# Patient Record
Sex: Male | Born: 1945 | Race: White | Hispanic: No | Marital: Married | Smoking: Never smoker
Health system: Southern US, Community
[De-identification: ages and names within clinical notes are randomized; demographics above are authoritative.]

## PROBLEM LIST (undated history)

## (undated) DIAGNOSIS — G9050 Complex regional pain syndrome I, unspecified: Secondary | ICD-10-CM

## (undated) DIAGNOSIS — Z87898 Personal history of other specified conditions: Secondary | ICD-10-CM

## (undated) DIAGNOSIS — G2581 Restless legs syndrome: Secondary | ICD-10-CM

## (undated) DIAGNOSIS — C801 Malignant (primary) neoplasm, unspecified: Secondary | ICD-10-CM

## (undated) DIAGNOSIS — H9190 Unspecified hearing loss, unspecified ear: Secondary | ICD-10-CM

## (undated) DIAGNOSIS — I499 Cardiac arrhythmia, unspecified: Secondary | ICD-10-CM

## (undated) DIAGNOSIS — Z8679 Personal history of other diseases of the circulatory system: Secondary | ICD-10-CM

## (undated) DIAGNOSIS — I10 Essential (primary) hypertension: Secondary | ICD-10-CM

## (undated) DIAGNOSIS — M660 Rupture of popliteal cyst: Secondary | ICD-10-CM

## (undated) DIAGNOSIS — R32 Unspecified urinary incontinence: Secondary | ICD-10-CM

## (undated) DIAGNOSIS — T84093A Other mechanical complication of internal left knee prosthesis, initial encounter: Secondary | ICD-10-CM

## (undated) DIAGNOSIS — M1990 Unspecified osteoarthritis, unspecified site: Secondary | ICD-10-CM

## (undated) DIAGNOSIS — K219 Gastro-esophageal reflux disease without esophagitis: Secondary | ICD-10-CM

## (undated) DIAGNOSIS — N189 Chronic kidney disease, unspecified: Secondary | ICD-10-CM

## (undated) HISTORY — DX: Rupture of popliteal cyst: M66.0

## (undated) HISTORY — PX: FOOT SURGERY: SHX648

## (undated) HISTORY — PX: JOINT REPLACEMENT: SHX530

## (undated) HISTORY — PX: PROSTATE BIOPSY: SHX241

## (undated) HISTORY — PX: BLADDER SURGERY: SHX569

## (undated) HISTORY — PX: OTHER SURGICAL HISTORY: SHX169

## (undated) HISTORY — PX: TRANSURETHRAL RESECTION OF PROSTATE: SHX73

---

## 1969-04-14 DIAGNOSIS — Z87898 Personal history of other specified conditions: Secondary | ICD-10-CM

## 1969-04-14 HISTORY — DX: Personal history of other specified conditions: Z87.898

## 1977-08-14 HISTORY — PX: CHOLECYSTECTOMY: SHX55

## 1989-04-14 HISTORY — PX: NASAL SINUS SURGERY: SHX719

## 2003-12-04 ENCOUNTER — Other Ambulatory Visit: Payer: Self-pay

## 2004-07-22 ENCOUNTER — Other Ambulatory Visit: Payer: Self-pay

## 2004-07-29 ENCOUNTER — Ambulatory Visit: Payer: Self-pay | Admitting: General Practice

## 2005-06-02 ENCOUNTER — Emergency Department: Payer: Self-pay | Admitting: Emergency Medicine

## 2005-06-05 ENCOUNTER — Encounter: Payer: Self-pay | Admitting: Unknown Physician Specialty

## 2005-06-07 ENCOUNTER — Emergency Department: Payer: Self-pay | Admitting: Unknown Physician Specialty

## 2005-06-14 ENCOUNTER — Encounter: Payer: Self-pay | Admitting: Unknown Physician Specialty

## 2005-06-20 ENCOUNTER — Emergency Department: Payer: Self-pay | Admitting: Emergency Medicine

## 2005-07-03 ENCOUNTER — Other Ambulatory Visit: Payer: Self-pay

## 2005-07-13 ENCOUNTER — Inpatient Hospital Stay: Payer: Self-pay | Admitting: General Practice

## 2005-07-14 ENCOUNTER — Encounter: Payer: Self-pay | Admitting: Unknown Physician Specialty

## 2005-08-03 ENCOUNTER — Ambulatory Visit: Payer: Self-pay | Admitting: Internal Medicine

## 2005-08-21 ENCOUNTER — Ambulatory Visit: Payer: Self-pay | Admitting: Surgery

## 2005-09-05 ENCOUNTER — Ambulatory Visit: Payer: Self-pay | Admitting: General Practice

## 2005-09-15 ENCOUNTER — Ambulatory Visit: Payer: Self-pay | Admitting: Surgery

## 2005-10-09 ENCOUNTER — Ambulatory Visit: Payer: Self-pay | Admitting: Unknown Physician Specialty

## 2005-10-19 ENCOUNTER — Ambulatory Visit: Payer: Self-pay | Admitting: Gastroenterology

## 2005-10-26 ENCOUNTER — Ambulatory Visit: Payer: Self-pay | Admitting: Gastroenterology

## 2005-12-12 ENCOUNTER — Ambulatory Visit: Payer: Self-pay | Admitting: Gastroenterology

## 2006-05-16 ENCOUNTER — Other Ambulatory Visit: Payer: Self-pay

## 2006-05-22 ENCOUNTER — Ambulatory Visit: Payer: Self-pay | Admitting: Urology

## 2007-01-31 ENCOUNTER — Ambulatory Visit: Payer: Self-pay

## 2007-09-06 ENCOUNTER — Ambulatory Visit: Payer: Self-pay | Admitting: General Practice

## 2007-11-04 ENCOUNTER — Encounter: Payer: Self-pay | Admitting: General Practice

## 2007-11-13 ENCOUNTER — Encounter: Payer: Self-pay | Admitting: General Practice

## 2007-11-27 ENCOUNTER — Ambulatory Visit: Payer: Self-pay | Admitting: General Practice

## 2007-12-17 ENCOUNTER — Other Ambulatory Visit: Payer: Self-pay

## 2007-12-17 ENCOUNTER — Ambulatory Visit: Payer: Self-pay | Admitting: General Practice

## 2007-12-30 ENCOUNTER — Inpatient Hospital Stay: Payer: Self-pay | Admitting: General Practice

## 2008-04-14 ENCOUNTER — Ambulatory Visit: Payer: Self-pay | Admitting: Urology

## 2008-05-09 ENCOUNTER — Emergency Department: Payer: Self-pay | Admitting: Emergency Medicine

## 2008-07-27 ENCOUNTER — Ambulatory Visit: Payer: Self-pay

## 2008-08-04 ENCOUNTER — Inpatient Hospital Stay: Payer: Self-pay | Admitting: General Practice

## 2008-09-02 ENCOUNTER — Ambulatory Visit: Payer: Self-pay | Admitting: Gastroenterology

## 2008-09-25 ENCOUNTER — Ambulatory Visit: Payer: Self-pay | Admitting: Gastroenterology

## 2008-10-02 ENCOUNTER — Ambulatory Visit: Payer: Self-pay | Admitting: Gastroenterology

## 2009-03-19 ENCOUNTER — Ambulatory Visit: Payer: Self-pay | Admitting: General Practice

## 2009-03-29 ENCOUNTER — Ambulatory Visit: Payer: Self-pay | Admitting: General Practice

## 2009-06-09 ENCOUNTER — Emergency Department: Payer: Self-pay | Admitting: Emergency Medicine

## 2009-09-28 ENCOUNTER — Ambulatory Visit: Payer: Self-pay | Admitting: Urology

## 2010-03-16 ENCOUNTER — Ambulatory Visit: Payer: Self-pay | Admitting: Urology

## 2010-03-22 ENCOUNTER — Ambulatory Visit: Payer: Self-pay | Admitting: Urology

## 2010-03-24 LAB — PATHOLOGY REPORT

## 2010-04-06 ENCOUNTER — Ambulatory Visit: Payer: Self-pay | Admitting: Surgery

## 2010-04-11 ENCOUNTER — Ambulatory Visit: Payer: Self-pay | Admitting: Surgery

## 2010-04-11 ENCOUNTER — Ambulatory Visit: Payer: Self-pay | Admitting: Unknown Physician Specialty

## 2010-10-25 ENCOUNTER — Ambulatory Visit: Payer: Self-pay | Admitting: Urology

## 2010-10-26 LAB — PATHOLOGY REPORT

## 2010-11-29 ENCOUNTER — Ambulatory Visit: Payer: Self-pay | Admitting: Urology

## 2011-05-17 ENCOUNTER — Ambulatory Visit: Payer: Self-pay | Admitting: Urology

## 2011-05-17 DIAGNOSIS — I10 Essential (primary) hypertension: Secondary | ICD-10-CM

## 2011-05-23 ENCOUNTER — Ambulatory Visit: Payer: Self-pay | Admitting: Urology

## 2011-05-26 LAB — PATHOLOGY REPORT

## 2011-11-16 ENCOUNTER — Ambulatory Visit: Payer: Self-pay | Admitting: Orthopedic Surgery

## 2011-11-30 ENCOUNTER — Ambulatory Visit: Payer: Self-pay | Admitting: Orthopedic Surgery

## 2011-12-12 ENCOUNTER — Ambulatory Visit: Payer: Self-pay | Admitting: Urology

## 2011-12-12 LAB — BASIC METABOLIC PANEL
BUN: 21 mg/dL — ABNORMAL HIGH (ref 7–18)
Calcium, Total: 9 mg/dL (ref 8.5–10.1)
Chloride: 104 mmol/L (ref 98–107)
Creatinine: 0.79 mg/dL (ref 0.60–1.30)
EGFR (African American): >60
EGFR (Non-African Amer.): >60
Glucose: 98 mg/dL (ref 65–99)
Potassium: 3.8 mmol/L (ref 3.5–5.1)
Sodium: 139 mmol/L (ref 136–145)

## 2011-12-13 LAB — PATHOLOGY REPORT

## 2012-03-04 ENCOUNTER — Encounter (HOSPITAL_COMMUNITY): Payer: Self-pay | Admitting: Pharmacy Technician

## 2012-03-13 ENCOUNTER — Other Ambulatory Visit (HOSPITAL_COMMUNITY): Payer: Medicare Other

## 2012-03-19 ENCOUNTER — Ambulatory Visit (HOSPITAL_COMMUNITY)
Admission: RE | Admit: 2012-03-19 | Payer: BC Managed Care – PPO | Source: Ambulatory Visit | Admitting: Orthopedic Surgery

## 2012-03-19 ENCOUNTER — Encounter (HOSPITAL_COMMUNITY): Admission: RE | Payer: Self-pay | Source: Ambulatory Visit

## 2012-03-19 SURGERY — TOTAL KNEE REVISION
Anesthesia: General | Laterality: Left

## 2012-04-05 ENCOUNTER — Inpatient Hospital Stay: Payer: Self-pay | Admitting: Internal Medicine

## 2012-04-05 LAB — URINALYSIS, COMPLETE
Bacteria: NONE SEEN
Ketone: NEGATIVE
Nitrite: NEGATIVE
Protein: NEGATIVE
RBC,UR: 4 /HPF (ref 0–5)
Squamous Epithelial: 1
WBC UR: 102 /HPF (ref 0–5)

## 2012-04-05 LAB — CBC
HCT: 46.6 % (ref 40.0–52.0)
HGB: 15.2 g/dL (ref 13.0–18.0)
MCH: 30 pg (ref 26.0–34.0)
MCHC: 32.7 g/dL (ref 32.0–36.0)
MCV: 92 fL (ref 80–100)
RBC: 5.08 10*6/uL (ref 4.40–5.90)

## 2012-04-05 LAB — COMPREHENSIVE METABOLIC PANEL
Anion Gap: 5 — ABNORMAL LOW (ref 7–16)
BUN: 16 mg/dL (ref 7–18)
Calcium, Total: 9.7 mg/dL (ref 8.5–10.1)
Chloride: 100 mmol/L (ref 98–107)
Co2: 30 mmol/L (ref 21–32)
EGFR (African American): >60
EGFR (Non-African Amer.): >60
Potassium: 4.5 mmol/L (ref 3.5–5.1)
SGOT(AST): 28 U/L (ref 15–37)
Total Protein: 8.3 g/dL — ABNORMAL HIGH (ref 6.4–8.2)

## 2012-04-06 LAB — CBC WITH DIFFERENTIAL/PLATELET
Basophil #: 0.1 10*3/uL (ref 0.0–0.1)
Eosinophil #: 0 10*3/uL (ref 0.0–0.7)
HCT: 41.7 % (ref 40.0–52.0)
HGB: 13.7 g/dL (ref 13.0–18.0)
Lymphocyte %: 5.3 %
MCH: 30.2 pg (ref 26.0–34.0)
MCHC: 32.8 g/dL (ref 32.0–36.0)
MCV: 92 fL (ref 80–100)
Monocyte #: 1.3 x10 3/mm — ABNORMAL HIGH (ref 0.2–1.0)
Monocyte %: 5.6 %
Neutrophil #: 20 10*3/uL — ABNORMAL HIGH (ref 1.4–6.5)

## 2012-04-07 LAB — CBC WITH DIFFERENTIAL/PLATELET
Basophil #: 0 10*3/uL (ref 0.0–0.1)
Eosinophil %: 1.3 %
Lymphocyte #: 1.3 10*3/uL (ref 1.0–3.6)
MCHC: 33.2 g/dL (ref 32.0–36.0)
MCV: 92 fL (ref 80–100)
Monocyte %: 5.2 %
Neutrophil %: 83.8 %
Platelet: 197 10*3/uL (ref 150–440)
RBC: 4.36 10*6/uL — ABNORMAL LOW (ref 4.40–5.90)
RDW: 14.1 % (ref 11.5–14.5)

## 2012-04-11 ENCOUNTER — Encounter (HOSPITAL_COMMUNITY): Payer: Self-pay | Admitting: Pharmacy Technician

## 2012-04-11 LAB — CULTURE, BLOOD (SINGLE)

## 2012-04-17 ENCOUNTER — Other Ambulatory Visit: Payer: Self-pay | Admitting: Orthopedic Surgery

## 2012-04-17 ENCOUNTER — Inpatient Hospital Stay (HOSPITAL_COMMUNITY): Admission: RE | Admit: 2012-04-17 | Payer: BC Managed Care – PPO | Source: Ambulatory Visit

## 2012-04-18 ENCOUNTER — Ambulatory Visit: Payer: Medicare Other | Admitting: Internal Medicine

## 2012-04-19 ENCOUNTER — Encounter (HOSPITAL_COMMUNITY): Payer: Self-pay

## 2012-04-19 ENCOUNTER — Encounter (HOSPITAL_COMMUNITY)
Admission: RE | Admit: 2012-04-19 | Discharge: 2012-04-19 | Disposition: A | Discharge status: Home / Self Care | Payer: BC Managed Care – PPO | Source: Ambulatory Visit | Attending: Orthopedic Surgery | Admitting: Orthopedic Surgery

## 2012-04-19 ENCOUNTER — Ambulatory Visit (HOSPITAL_COMMUNITY)
Admission: RE | Admit: 2012-04-19 | Discharge: 2012-04-19 | Disposition: A | Discharge status: Home / Self Care | Payer: BC Managed Care – PPO | Source: Ambulatory Visit | Attending: Orthopedic Surgery | Admitting: Orthopedic Surgery

## 2012-04-19 DIAGNOSIS — Z01812 Encounter for preprocedural laboratory examination: Secondary | ICD-10-CM | POA: Insufficient documentation

## 2012-04-19 DIAGNOSIS — T84099A Other mechanical complication of unspecified internal joint prosthesis, initial encounter: Secondary | ICD-10-CM | POA: Insufficient documentation

## 2012-04-19 DIAGNOSIS — Z96659 Presence of unspecified artificial knee joint: Secondary | ICD-10-CM | POA: Insufficient documentation

## 2012-04-19 DIAGNOSIS — Z01810 Encounter for preprocedural cardiovascular examination: Secondary | ICD-10-CM | POA: Insufficient documentation

## 2012-04-19 DIAGNOSIS — Y831 Surgical operation with implant of artificial internal device as the cause of abnormal reaction of the patient, or of later complication, without mention of misadventure at the time of the procedure: Secondary | ICD-10-CM | POA: Insufficient documentation

## 2012-04-19 DIAGNOSIS — I10 Essential (primary) hypertension: Secondary | ICD-10-CM | POA: Insufficient documentation

## 2012-04-19 HISTORY — DX: Personal history of other diseases of the circulatory system: Z86.79

## 2012-04-19 HISTORY — DX: Cardiac arrhythmia, unspecified: I49.9

## 2012-04-19 HISTORY — DX: Complex regional pain syndrome I, unspecified: G90.50

## 2012-04-19 HISTORY — DX: Essential (primary) hypertension: I10

## 2012-04-19 HISTORY — DX: Unspecified urinary incontinence: R32

## 2012-04-19 HISTORY — DX: Unspecified hearing loss, unspecified ear: H91.90

## 2012-04-19 HISTORY — DX: Restless legs syndrome: G25.81

## 2012-04-19 HISTORY — DX: Unspecified osteoarthritis, unspecified site: M19.90

## 2012-04-19 HISTORY — DX: Malignant (primary) neoplasm, unspecified: C80.1

## 2012-04-19 HISTORY — DX: Personal history of other specified conditions: Z87.898

## 2012-04-19 HISTORY — DX: Gastro-esophageal reflux disease without esophagitis: K21.9

## 2012-04-19 HISTORY — DX: Chronic kidney disease, unspecified: N18.9

## 2012-04-19 LAB — SURGICAL PCR SCREEN
MRSA, PCR: NEGATIVE
Staphylococcus aureus: NEGATIVE

## 2012-04-19 LAB — URINE MICROSCOPIC-ADD ON

## 2012-04-19 LAB — URINALYSIS, ROUTINE W REFLEX MICROSCOPIC
Bilirubin Urine: NEGATIVE
Glucose, UA: NEGATIVE mg/dL
Hgb urine dipstick: NEGATIVE
Specific Gravity, Urine: 1.022 (ref 1.005–1.030)
Urobilinogen, UA: 0.2 mg/dL (ref 0.0–1.0)
pH: 7.5 (ref 5.0–8.0)

## 2012-04-19 LAB — CBC
HCT: 45.9 % (ref 39.0–52.0)
Hemoglobin: 15.1 g/dL (ref 13.0–17.0)
MCH: 29.8 pg (ref 26.0–34.0)
MCHC: 32.9 g/dL (ref 30.0–36.0)
MCV: 90.5 fL (ref 78.0–100.0)
RBC: 5.07 MIL/uL (ref 4.22–5.81)

## 2012-04-19 LAB — BASIC METABOLIC PANEL
BUN: 20 mg/dL (ref 6–23)
CO2: 31 mEq/L (ref 19–32)
Glucose, Bld: 101 mg/dL — ABNORMAL HIGH (ref 70–99)
Potassium: 4.6 mEq/L (ref 3.5–5.1)
Sodium: 135 mEq/L (ref 135–145)

## 2012-04-19 LAB — APTT: aPTT: 32 seconds (ref 24–37)

## 2012-04-19 NOTE — Progress Notes (Signed)
UA faxed to office - spoke with Leotis Shames and Jewel Baize at office to confirm received .

## 2012-04-19 NOTE — Patient Instructions (Addendum)
20 Garrett Ferguson  04/19/2012   Your procedure is scheduled on: 910/13 AT 7:30 AM  Report to SHORT STAY DEPT  At  5:15 AM.  Call this number if you have problems the morning of surgery: [phone]   Remember:   Do not eat food or drink liquids AFTER MIDNIGHT    Take these medicines the morning of surgery with A SIP OF WATER:ALPRAZOLAM / DILTIAZEM / HYOPHEN / MIRAPEX / TRAMADOL   Do not wear jewelry, make-up or nail polish.  Do not wear lotions, powders, or perfumes.   Do not shave legs or underarms 48 hrs. before surgery (men may shave face)  Do not bring valuables to the hospital.  Contacts, dentures or bridgework may not be worn into surgery.  Leave suitcase in the car. After surgery it may be brought to your room.  For patients admitted to the hospital, checkout time is 11:00 AM the day of discharge.   Patients discharged the day of surgery will not be allowed to drive home. If going home same day of surgery, must have someone stay with you first 24 hrs at home and arrange for some one to drive you home from hospital.    Special Instructions:   Please read over the following fact sheets that you were given: MRSA  Information / Incentive Spirometer               SHOWER WITH BETASEPT THE NIGHT BEFORE SURGERY AND THE MORNING OF SURGERY          X______________________________________________________________________

## 2012-04-23 ENCOUNTER — Encounter (HOSPITAL_COMMUNITY)
Admission: RE | Disposition: A | Discharge status: Skilled Nursing Facility | Payer: Self-pay | Source: Ambulatory Visit | Attending: Orthopedic Surgery

## 2012-04-23 ENCOUNTER — Inpatient Hospital Stay (HOSPITAL_COMMUNITY)
Admission: RE | Admit: 2012-04-23 | Discharge: 2012-04-26 | DRG: 468 | Disposition: A | Discharge status: Skilled Nursing Facility | Payer: BC Managed Care – PPO | Source: Ambulatory Visit | Attending: Orthopedic Surgery | Admitting: Orthopedic Surgery

## 2012-04-23 ENCOUNTER — Ambulatory Visit (HOSPITAL_COMMUNITY): Payer: BC Managed Care – PPO

## 2012-04-23 ENCOUNTER — Encounter (HOSPITAL_COMMUNITY): Payer: Self-pay | Admitting: Anesthesiology

## 2012-04-23 ENCOUNTER — Encounter (HOSPITAL_COMMUNITY): Payer: Self-pay | Admitting: Orthopedic Surgery

## 2012-04-23 ENCOUNTER — Ambulatory Visit (HOSPITAL_COMMUNITY): Payer: BC Managed Care – PPO | Admitting: Anesthesiology

## 2012-04-23 ENCOUNTER — Encounter (HOSPITAL_COMMUNITY): Payer: Self-pay | Admitting: *Deleted

## 2012-04-23 DIAGNOSIS — T84039A Mechanical loosening of unspecified internal prosthetic joint, initial encounter: Principal | ICD-10-CM | POA: Diagnosis present

## 2012-04-23 DIAGNOSIS — Z96659 Presence of unspecified artificial knee joint: Secondary | ICD-10-CM

## 2012-04-23 DIAGNOSIS — I10 Essential (primary) hypertension: Secondary | ICD-10-CM | POA: Diagnosis present

## 2012-04-23 DIAGNOSIS — Y831 Surgical operation with implant of artificial internal device as the cause of abnormal reaction of the patient, or of later complication, without mention of misadventure at the time of the procedure: Secondary | ICD-10-CM | POA: Diagnosis present

## 2012-04-23 DIAGNOSIS — T84099A Other mechanical complication of unspecified internal joint prosthesis, initial encounter: Secondary | ICD-10-CM | POA: Diagnosis present

## 2012-04-23 DIAGNOSIS — Z8551 Personal history of malignant neoplasm of bladder: Secondary | ICD-10-CM

## 2012-04-23 DIAGNOSIS — T84093A Other mechanical complication of internal left knee prosthesis, initial encounter: Secondary | ICD-10-CM

## 2012-04-23 HISTORY — DX: Other mechanical complication of internal left knee prosthesis, initial encounter: T84.093A

## 2012-04-23 HISTORY — PX: TOTAL KNEE REVISION: SHX996

## 2012-04-23 LAB — GRAM STAIN

## 2012-04-23 LAB — SYNOVIAL CELL COUNT + DIFF, W/ CRYSTALS
Lymphocytes-Synovial Fld: 33 % — ABNORMAL HIGH (ref 0–20)
Monocyte-Macrophage-Synovial Fluid: 34 % — ABNORMAL LOW (ref 50–90)
Neutrophil, Synovial: 32 % — ABNORMAL HIGH (ref 0–25)

## 2012-04-23 LAB — TYPE AND SCREEN
ABO/RH(D): B POS
Antibody Screen: NEGATIVE

## 2012-04-23 SURGERY — TOTAL KNEE REVISION
Anesthesia: General | Site: Knee | Laterality: Left | Wound class: Clean

## 2012-04-23 MED ORDER — ADULT MULTIVITAMIN W/MINERALS CH
1.0000 | ORAL_TABLET | Freq: Every day | ORAL | Status: DC
  Administered 2012-04-24 – 2012-04-26 (×3): 1 via ORAL
  Filled 2012-04-23 (×4): qty 1

## 2012-04-23 MED ORDER — URELLE 81 MG PO TABS
1.0000 | ORAL_TABLET | Freq: Four times a day (QID) | ORAL | Status: DC
  Administered 2012-04-23 – 2012-04-26 (×10): 81 mg via ORAL
  Filled 2012-04-23 (×15): qty 1

## 2012-04-23 MED ORDER — ONDANSETRON HCL 4 MG/2ML IJ SOLN: 4.0000 mg | Freq: Four times a day (QID) | INTRAMUSCULAR | Status: DC | PRN

## 2012-04-23 MED ORDER — PROMETHAZINE HCL 25 MG PO TABS: 25.0000 mg | ORAL_TABLET | Freq: Four times a day (QID) | ORAL | Status: DC | PRN

## 2012-04-23 MED ORDER — LIDOCAINE HCL (CARDIAC) 20 MG/ML IV SOLN
INTRAVENOUS | Status: DC | PRN
  Administered 2012-04-23: 80 mg via INTRAVENOUS

## 2012-04-23 MED ORDER — PRAMIPEXOLE DIHYDROCHLORIDE 1.5 MG PO TABS
1.5000 mg | ORAL_TABLET | Freq: Every day | ORAL | Status: DC
  Administered 2012-04-23 – 2012-04-24 (×2): 1.5 mg via ORAL
  Filled 2012-04-23 (×4): qty 1

## 2012-04-23 MED ORDER — HYDROMORPHONE HCL PF 1 MG/ML IJ SOLN
0.2500 mg | INTRAMUSCULAR | Status: DC | PRN
  Administered 2012-04-23 (×2): 0.5 mg via INTRAVENOUS

## 2012-04-23 MED ORDER — PROMETHAZINE HCL 25 MG/ML IJ SOLN: 6.2500 mg | INTRAMUSCULAR | Status: DC | PRN

## 2012-04-23 MED ORDER — IMIPRAMINE HCL 25 MG PO TABS
25.0000 mg | ORAL_TABLET | Freq: Every day | ORAL | Status: DC
  Administered 2012-04-23 – 2012-04-24 (×2): 25 mg via ORAL
  Filled 2012-04-23 (×4): qty 1

## 2012-04-23 MED ORDER — METOCLOPRAMIDE HCL 10 MG PO TABS: 5.0000 mg | ORAL_TABLET | Freq: Three times a day (TID) | ORAL | Status: DC | PRN

## 2012-04-23 MED ORDER — NITROFURANTOIN MACROCRYSTAL 100 MG PO CAPS
100.0000 mg | ORAL_CAPSULE | Freq: Every day | ORAL | Status: DC
  Administered 2012-04-23 – 2012-04-24 (×2): 100 mg via ORAL
  Filled 2012-04-23 (×4): qty 1

## 2012-04-23 MED ORDER — WARFARIN SODIUM 7.5 MG PO TABS
7.5000 mg | ORAL_TABLET | Freq: Once | ORAL | Status: AC
  Administered 2012-04-23: 7.5 mg via ORAL
  Filled 2012-04-23: qty 1

## 2012-04-23 MED ORDER — WARFARIN VIDEO
Freq: Once | Status: AC
  Administered 2012-04-24: 12:00:00

## 2012-04-23 MED ORDER — WARFARIN SODIUM 5 MG PO TABS: 5.0000 mg | ORAL_TABLET | Freq: Every day | ORAL | Status: DC

## 2012-04-23 MED ORDER — KETOROLAC TROMETHAMINE 15 MG/ML IJ SOLN
7.5000 mg | Freq: Four times a day (QID) | INTRAMUSCULAR | Status: AC
  Administered 2012-04-23 – 2012-04-24 (×3): 7.5 mg via INTRAVENOUS
  Filled 2012-04-23 (×3): qty 1

## 2012-04-23 MED ORDER — SODIUM CHLORIDE 0.9 % IR SOLN
Status: DC | PRN
  Administered 2012-04-23: 1000 mL

## 2012-04-23 MED ORDER — ACETAMINOPHEN 325 MG PO TABS: 650.0000 mg | ORAL_TABLET | Freq: Four times a day (QID) | ORAL | Status: DC | PRN

## 2012-04-23 MED ORDER — ACETAMINOPHEN 10 MG/ML IV SOLN
1000.0000 mg | Freq: Four times a day (QID) | INTRAVENOUS | Status: AC
  Administered 2012-04-23 – 2012-04-24 (×4): 1000 mg via INTRAVENOUS
  Filled 2012-04-23 (×5): qty 100

## 2012-04-23 MED ORDER — DILTIAZEM HCL ER BEADS 240 MG PO CP24: 360.0000 mg | ORAL_CAPSULE | Freq: Every day | ORAL | Status: DC

## 2012-04-23 MED ORDER — DIPHENHYDRAMINE HCL 12.5 MG/5ML PO ELIX: 12.5000 mg | ORAL_SOLUTION | ORAL | Status: DC | PRN

## 2012-04-23 MED ORDER — ALUM & MAG HYDROXIDE-SIMETH 200-200-20 MG/5ML PO SUSP: 30.0000 mL | ORAL | Status: DC | PRN

## 2012-04-23 MED ORDER — PROPOFOL 10 MG/ML IV BOLUS
INTRAVENOUS | Status: DC | PRN
  Administered 2012-04-23: 170 mg via INTRAVENOUS
  Administered 2012-04-23: 30 mg via INTRAVENOUS

## 2012-04-23 MED ORDER — PHENOL 1.4 % MT LIQD: 1.0000 | OROMUCOSAL | Status: DC | PRN

## 2012-04-23 MED ORDER — OXYCODONE-ACETAMINOPHEN 10-325 MG PO TABS: 1.0000 | ORAL_TABLET | Freq: Four times a day (QID) | ORAL | Status: AC | PRN

## 2012-04-23 MED ORDER — METHOCARBAMOL 500 MG PO TABS
500.0000 mg | ORAL_TABLET | Freq: Four times a day (QID) | ORAL | Status: DC | PRN
  Administered 2012-04-24 – 2012-04-26 (×6): 500 mg via ORAL
  Filled 2012-04-23 (×6): qty 1

## 2012-04-23 MED ORDER — HYDROMORPHONE HCL PF 1 MG/ML IJ SOLN
INTRAMUSCULAR | Status: AC
  Administered 2012-04-23: 16:00:00
  Filled 2012-04-23: qty 1

## 2012-04-23 MED ORDER — LISINOPRIL 40 MG PO TABS
40.0000 mg | ORAL_TABLET | Freq: Every day | ORAL | Status: DC
  Administered 2012-04-24 – 2012-04-26 (×3): 40 mg via ORAL
  Filled 2012-04-23 (×3): qty 1

## 2012-04-23 MED ORDER — ZOLPIDEM TARTRATE 10 MG PO TABS: 10.0000 mg | ORAL_TABLET | Freq: Every evening | ORAL | Status: DC | PRN

## 2012-04-23 MED ORDER — ACETAMINOPHEN 10 MG/ML IV SOLN
INTRAVENOUS | Status: DC | PRN
  Administered 2012-04-23: 1000 mg via INTRAVENOUS

## 2012-04-23 MED ORDER — METOCLOPRAMIDE HCL 5 MG/ML IJ SOLN: 5.0000 mg | Freq: Three times a day (TID) | INTRAMUSCULAR | Status: DC | PRN

## 2012-04-23 MED ORDER — VANCOMYCIN HCL IN DEXTROSE 1-5 GM/200ML-% IV SOLN
1000.0000 mg | Freq: Two times a day (BID) | INTRAVENOUS | Status: AC
  Administered 2012-04-23: 1000 mg via INTRAVENOUS
  Filled 2012-04-23: qty 200

## 2012-04-23 MED ORDER — LACTATED RINGERS IV SOLN
INTRAVENOUS | Status: DC
  Administered 2012-04-23: 14:00:00 via INTRAVENOUS

## 2012-04-23 MED ORDER — QUINAPRIL HCL 10 MG PO TABS: 40.0000 mg | ORAL_TABLET | Freq: Every morning | ORAL | Status: DC

## 2012-04-23 MED ORDER — POTASSIUM CHLORIDE IN NACL 20-0.45 MEQ/L-% IV SOLN
INTRAVENOUS | Status: DC
  Administered 2012-04-23 – 2012-04-24 (×3): via INTRAVENOUS
  Filled 2012-04-23 (×6): qty 1000

## 2012-04-23 MED ORDER — MAGNESIUM HYDROXIDE 400 MG/5ML PO SUSP: 30.0000 mL | Freq: Every day | ORAL | Status: DC | PRN

## 2012-04-23 MED ORDER — PRAMIPEXOLE DIHYDROCHLORIDE 1 MG PO TABS: 1.0000 mg | ORAL_TABLET | Freq: Two times a day (BID) | ORAL | Status: DC

## 2012-04-23 MED ORDER — SUCCINYLCHOLINE CHLORIDE 20 MG/ML IJ SOLN
INTRAMUSCULAR | Status: DC | PRN
  Administered 2012-04-23: 80 mg via INTRAVENOUS

## 2012-04-23 MED ORDER — OXYCODONE HCL 5 MG PO TABS
5.0000 mg | ORAL_TABLET | ORAL | Status: DC | PRN
  Administered 2012-04-23 (×2): 5 mg via ORAL
  Administered 2012-04-24 – 2012-04-25 (×5): 10 mg via ORAL
  Administered 2012-04-25: 5 mg via ORAL
  Administered 2012-04-25 – 2012-04-26 (×4): 10 mg via ORAL
  Filled 2012-04-23: qty 2
  Filled 2012-04-23 (×2): qty 1
  Filled 2012-04-23 (×5): qty 2
  Filled 2012-04-23: qty 1
  Filled 2012-04-23 (×2): qty 2
  Filled 2012-04-23: qty 1
  Filled 2012-04-23: qty 2

## 2012-04-23 MED ORDER — HYDROMORPHONE HCL PF 1 MG/ML IJ SOLN
1.0000 mg | INTRAMUSCULAR | Status: DC | PRN
  Administered 2012-04-23 – 2012-04-24 (×3): 1 mg via INTRAVENOUS
  Filled 2012-04-23 (×3): qty 1

## 2012-04-23 MED ORDER — TRAMADOL HCL 50 MG PO TABS: 50.0000 mg | ORAL_TABLET | Freq: Three times a day (TID) | ORAL | Status: DC | PRN

## 2012-04-23 MED ORDER — PRAMIPEXOLE DIHYDROCHLORIDE 1 MG PO TABS
1.0000 mg | ORAL_TABLET | Freq: Every day | ORAL | Status: DC
  Administered 2012-04-24 – 2012-04-26 (×3): 1 mg via ORAL
  Filled 2012-04-23 (×5): qty 1

## 2012-04-23 MED ORDER — SENNA 8.6 MG PO TABS
1.0000 | ORAL_TABLET | Freq: Two times a day (BID) | ORAL | Status: DC
  Administered 2012-04-24 – 2012-04-25 (×3): 8.6 mg via ORAL
  Filled 2012-04-23 (×3): qty 1

## 2012-04-23 MED ORDER — LACTATED RINGERS IV SOLN: INTRAVENOUS | Status: DC

## 2012-04-23 MED ORDER — WARFARIN - PHARMACIST DOSING INPATIENT
Freq: Every day | Status: DC
  Administered 2012-04-23: 18:00:00

## 2012-04-23 MED ORDER — METHOCARBAMOL 500 MG PO TABS: 500.0000 mg | ORAL_TABLET | Freq: Four times a day (QID) | ORAL | Status: AC

## 2012-04-23 MED ORDER — FENTANYL CITRATE 0.05 MG/ML IJ SOLN
INTRAMUSCULAR | Status: DC | PRN
  Administered 2012-04-23: 50 ug via INTRAVENOUS
  Administered 2012-04-23: 100 ug via INTRAVENOUS
  Administered 2012-04-23 (×3): 50 ug via INTRAVENOUS
  Administered 2012-04-23: 100 ug via INTRAVENOUS
  Administered 2012-04-23 (×3): 50 ug via INTRAVENOUS
  Administered 2012-04-23: 100 ug via INTRAVENOUS
  Administered 2012-04-23 (×2): 50 ug via INTRAVENOUS

## 2012-04-23 MED ORDER — ONDANSETRON HCL 4 MG/2ML IJ SOLN
INTRAMUSCULAR | Status: DC | PRN
  Administered 2012-04-23: 4 mg via INTRAVENOUS

## 2012-04-23 MED ORDER — MEPERIDINE HCL 50 MG/ML IJ SOLN: 6.2500 mg | INTRAMUSCULAR | Status: DC | PRN

## 2012-04-23 MED ORDER — MENTHOL 3 MG MT LOZG: 1.0000 | LOZENGE | OROMUCOSAL | Status: DC | PRN

## 2012-04-23 MED ORDER — DILTIAZEM HCL ER COATED BEADS 360 MG PO CP24
360.0000 mg | ORAL_CAPSULE | Freq: Every day | ORAL | Status: DC
  Administered 2012-04-24 – 2012-04-26 (×3): 360 mg via ORAL
  Filled 2012-04-23 (×3): qty 1

## 2012-04-23 MED ORDER — METHOCARBAMOL 100 MG/ML IJ SOLN
500.0000 mg | Freq: Four times a day (QID) | INTRAVENOUS | Status: DC | PRN
  Administered 2012-04-23: 500 mg via INTRAVENOUS
  Filled 2012-04-23: qty 5

## 2012-04-23 MED ORDER — SORBITOL 70 % SOLN
30.0000 mL | Freq: Every day | Status: DC | PRN
  Filled 2012-04-23: qty 30

## 2012-04-23 MED ORDER — ACETAMINOPHEN 10 MG/ML IV SOLN
INTRAVENOUS | Status: AC
  Filled 2012-04-23: qty 100

## 2012-04-23 MED ORDER — HYDROMORPHONE HCL PF 1 MG/ML IJ SOLN
INTRAMUSCULAR | Status: AC
  Filled 2012-04-23: qty 1

## 2012-04-23 MED ORDER — LACTATED RINGERS IV SOLN
INTRAVENOUS | Status: DC | PRN
  Administered 2012-04-23 (×3): via INTRAVENOUS

## 2012-04-23 MED ORDER — DEXAMETHASONE SODIUM PHOSPHATE 4 MG/ML IJ SOLN
INTRAMUSCULAR | Status: DC | PRN
  Administered 2012-04-23: 10 mg via INTRAVENOUS

## 2012-04-23 MED ORDER — ENOXAPARIN SODIUM 30 MG/0.3ML ~~LOC~~ SOLN
30.0000 mg | Freq: Two times a day (BID) | SUBCUTANEOUS | Status: DC
  Administered 2012-04-24 – 2012-04-26 (×4): 30 mg via SUBCUTANEOUS
  Filled 2012-04-23 (×8): qty 0.3

## 2012-04-23 MED ORDER — VANCOMYCIN HCL 1000 MG IV SOLR
1500.0000 mg | INTRAVENOUS | Status: AC
  Administered 2012-04-23: 1500 mg via INTRAVENOUS
  Filled 2012-04-23: qty 1500

## 2012-04-23 MED ORDER — ALPRAZOLAM 0.5 MG PO TABS
0.5000 mg | ORAL_TABLET | Freq: Two times a day (BID) | ORAL | Status: DC
  Administered 2012-04-23 – 2012-04-26 (×5): 0.5 mg via ORAL
  Filled 2012-04-23 (×5): qty 1

## 2012-04-23 MED ORDER — COUMADIN BOOK
Freq: Once | Status: AC
  Administered 2012-04-24: 18:00:00
  Filled 2012-04-23 (×2): qty 1

## 2012-04-23 MED ORDER — DOCUSATE SODIUM 100 MG PO CAPS
100.0000 mg | ORAL_CAPSULE | Freq: Two times a day (BID) | ORAL | Status: DC
  Administered 2012-04-24 – 2012-04-25 (×3): 100 mg via ORAL

## 2012-04-23 MED ORDER — ACETAMINOPHEN 650 MG RE SUPP: 650.0000 mg | Freq: Four times a day (QID) | RECTAL | Status: DC | PRN

## 2012-04-23 MED ORDER — ONDANSETRON HCL 4 MG PO TABS: 4.0000 mg | ORAL_TABLET | Freq: Four times a day (QID) | ORAL | Status: DC | PRN

## 2012-04-23 MED ORDER — METH-HYO-M BL-BENZ ACD-PH SAL 81.6 MG PO TABS: 81.6000 mg | ORAL_TABLET | Freq: Four times a day (QID) | ORAL | Status: DC

## 2012-04-23 MED ORDER — MIDAZOLAM HCL 5 MG/5ML IJ SOLN
INTRAMUSCULAR | Status: DC | PRN
  Administered 2012-04-23: 1 mg via INTRAVENOUS

## 2012-04-23 SURGICAL SUPPLY — 79 items
ADAPTER BOLT FEMORAL +2/-2 (Knees) ×1 IMPLANT
ADPR FEM +2/-2 OFST BOLT (Knees) ×1 IMPLANT
ADPR FEM 5D STRL KN PFC SGM (Orthopedic Implant) ×1 IMPLANT
AUG FEM SZ3 4 CMB POST STRL LF (Knees) ×2 IMPLANT
AUG FEM SZ3 4 STRL LF KN LT TI (Knees) ×2 IMPLANT
AUG TIB SZ4 5 REV STP WDG STRL (Knees) ×2 IMPLANT
BAG SPEC THK2 15X12 ZIP CLS (MISCELLANEOUS) ×1
BAG ZIPLOCK 12X15 (MISCELLANEOUS) ×2 IMPLANT
BANDAGE ELASTIC 4 VELCRO ST LF (GAUZE/BANDAGES/DRESSINGS) ×2 IMPLANT
BANDAGE ELASTIC 6 VELCRO ST LF (GAUZE/BANDAGES/DRESSINGS) ×2 IMPLANT
BANDAGE ESMARK 6X9 LF (GAUZE/BANDAGES/DRESSINGS) ×1 IMPLANT
BLADE SAG 18X100X1.27 (BLADE) ×2 IMPLANT
BLADE SAW SGTL 13.0X1.19X90.0M (BLADE) ×2 IMPLANT
BLADE SURG SZ10 CARB STEEL (BLADE) ×4 IMPLANT
BNDG CMPR 9X6 STRL LF SNTH (GAUZE/BANDAGES/DRESSINGS) ×1
BNDG ESMARK 6X9 LF (GAUZE/BANDAGES/DRESSINGS) ×2
BONE CEMENT GENTAMICIN (Cement) ×8 IMPLANT
CANISTER SUCTION 2500CC (MISCELLANEOUS) ×2 IMPLANT
CEMENT BONE GENTAMICIN 40 (Cement) IMPLANT
CEMENT RESTRICTOR DEPUY SZ 4 (Cement) ×1 IMPLANT
CLOTH BEACON ORANGE TIMEOUT ST (SAFETY) ×2 IMPLANT
CONT SPEC 4OZ CLIKSEAL STRL BL (MISCELLANEOUS) ×1 IMPLANT
CUFF TOURN SGL QUICK 34 (TOURNIQUET CUFF) ×2
CUFF TRNQT CYL 34X4X40X1 (TOURNIQUET CUFF) ×1 IMPLANT
DISTAL WEDGE PFC 4MM LEFT (Knees) ×4 IMPLANT
DRAPE ORTHO SPLIT 77X108 STRL (DRAPES) ×4
DRAPE POUCH INSTRU U-SHP 10X18 (DRAPES) ×2 IMPLANT
DRAPE SURG ORHT 6 SPLT 77X108 (DRAPES) ×2 IMPLANT
DRAPE U-SHAPE 47X51 STRL (DRAPES) ×2 IMPLANT
DRSG ADAPTIC 3X8 NADH LF (GAUZE/BANDAGES/DRESSINGS) ×2 IMPLANT
DRSG PAD ABDOMINAL 8X10 ST (GAUZE/BANDAGES/DRESSINGS) ×2 IMPLANT
ELECT REM PT RETURN 9FT ADLT (ELECTROSURGICAL) ×2
ELECTRODE REM PT RTRN 9FT ADLT (ELECTROSURGICAL) ×1 IMPLANT
EVACUATOR 1/8 PVC DRAIN (DRAIN) ×2 IMPLANT
FACESHIELD LNG OPTICON STERILE (SAFETY) ×10 IMPLANT
FEM TC3 PFC SZ3 LEFT (Orthopedic Implant) ×2 IMPLANT
FEMORAL ADAPTER (Orthopedic Implant) ×1 IMPLANT
FEMORAL TC3 PFC SZ3 LEFT (Orthopedic Implant) IMPLANT
GLOVE BIOGEL PI IND STRL 8 (GLOVE) ×1 IMPLANT
GLOVE BIOGEL PI INDICATOR 8 (GLOVE) ×1
GOWN STRL NON-REIN LRG LVL3 (GOWN DISPOSABLE) ×2 IMPLANT
HANDPIECE INTERPULSE COAX TIP (DISPOSABLE) ×2
IMMOBILIZER KNEE 20 (SOFTGOODS) ×2
IMMOBILIZER KNEE 20 THIGH 36 (SOFTGOODS) IMPLANT
INSERT TIBIAL TC3 15.0 (Knees) ×1 IMPLANT
KIT BASIN OR (CUSTOM PROCEDURE TRAY) ×2 IMPLANT
NDL SAFETY ECLIPSE 18X1.5 (NEEDLE) IMPLANT
NEEDLE HYPO 18GX1.5 SHARP (NEEDLE) ×2
NS IRRIG 1000ML POUR BTL (IV SOLUTION) ×2 IMPLANT
PACK TOTAL JOINT (CUSTOM PROCEDURE TRAY) ×2 IMPLANT
PAD CAST 4YDX4 CTTN HI CHSV (CAST SUPPLIES) ×2 IMPLANT
PADDING CAST COTTON 4X4 STRL (CAST SUPPLIES) ×4
PADDING CAST COTTON 6X4 STRL (CAST SUPPLIES) ×3 IMPLANT
PATELLA DOME PFC 32MM (Knees) ×1 IMPLANT
POSITIONER SURGICAL ARM (MISCELLANEOUS) ×2 IMPLANT
POST AVE PFC 4MM (Knees) ×2 IMPLANT
RESTRICTOR CEMENT SZ 5 C-STEM (Cement) ×1 IMPLANT
SET HNDPC FAN SPRY TIP SCT (DISPOSABLE) ×1 IMPLANT
SLEEVE SURGEON STRL (DRAPES) ×1 IMPLANT
SPONGE GAUZE 4X4 12PLY (GAUZE/BANDAGES/DRESSINGS) ×1 IMPLANT
SPONGE LAP 18X18 X RAY DECT (DISPOSABLE) ×2 IMPLANT
STAPLER VISISTAT 35W (STAPLE) IMPLANT
STEM TIBIA PFC 13X30MM (Stem) ×2 IMPLANT
SUCTION FRAZIER 12FR DISP (SUCTIONS) ×2 IMPLANT
SUT VIC AB 0 CT1 27 (SUTURE) ×6
SUT VIC AB 0 CT1 27XBRD ANTBC (SUTURE) ×3 IMPLANT
SUT VIC AB 1 CT1 27 (SUTURE) ×6
SUT VIC AB 1 CT1 27XBRD ANTBC (SUTURE) ×3 IMPLANT
SUT VIC AB 2-0 CT1 27 (SUTURE) ×6
SUT VIC AB 2-0 CT1 TAPERPNT 27 (SUTURE) ×3 IMPLANT
SUT VIC AB 3-0 SH 18 (SUTURE) ×2 IMPLANT
SYR 20CC LL (SYRINGE) ×1 IMPLANT
TOWEL OR 17X26 10 PK STRL BLUE (TOWEL DISPOSABLE) ×6 IMPLANT
TRAY FOLEY CATH 14FRSI W/METER (CATHETERS) ×2 IMPLANT
TRAY REVISION SZ 4 (Knees) ×1 IMPLANT
TRAY SLEEVE CEM ML (Knees) ×1 IMPLANT
WATER STERILE IRR 1500ML POUR (IV SOLUTION) ×2 IMPLANT
WEDGE DISTAL PFC LEFT 4MM (Knees) IMPLANT
WEDGE SIZE 4 5MM (Knees) ×2 IMPLANT

## 2012-04-23 NOTE — Anesthesia Preprocedure Evaluation (Addendum)
Anesthesia Evaluation  Patient identified by MRN, date of birth, ID band Patient awake    Reviewed: Allergy & Precautions, H&P , NPO status , Patient's Chart, lab work & pertinent test results  Airway Mallampati: II TM Distance: >3 FB Neck ROM: Full    Dental No notable dental hx.    Pulmonary neg pulmonary ROS,  breath sounds clear to auscultation  Pulmonary exam normal       Cardiovascular hypertension, Pt. on medications negative cardio ROS  - dysrhythmias Rhythm:Regular Rate:Normal  RBBB   Neuro/Psych  Neuromuscular disease (RSD) negative neurological ROS  negative psych ROS   GI/Hepatic negative GI ROS, Neg liver ROS,   Endo/Other  negative endocrine ROS  Renal/GU negative Renal ROS  negative genitourinary   Musculoskeletal negative musculoskeletal ROS (+)   Abdominal   Peds negative pediatric ROS (+)  Hematology negative hematology ROS (+)   Anesthesia Other Findings   Reproductive/Obstetrics negative OB ROS                           Anesthesia Physical Anesthesia Plan  ASA: II  Anesthesia Plan: General   Post-op Pain Management:    Induction: Intravenous  Airway Management Planned: Oral ETT  Additional Equipment:   Intra-op Plan:   Post-operative Plan: Extubation in OR  Informed Consent: I have reviewed the patients History and Physical, chart, labs and discussed the procedure including the risks, benefits and alternatives for the proposed anesthesia with the patient or authorized representative who has indicated his/her understanding and acceptance.   Dental advisory given  Plan Discussed with: CRNA  Anesthesia Plan Comments:         Anesthesia Quick Evaluation

## 2012-04-23 NOTE — Transfer of Care (Signed)
Immediate Anesthesia Transfer of Care Note  Patient: Garrett Ferguson  Procedure(s) Performed: Procedure(s) (LRB) with comments: TOTAL KNEE REVISION (Left)  Patient Location: PACU  Anesthesia Type: General  Level of Consciousness: sedated and responds to stimulation  Airway & Oxygen Therapy: Patient Spontanous Breathing and Patient connected to face mask oxygen  Post-op Assessment: Report given to PACU RN  Post vital signs: Reviewed and stable  Complications: No apparent anesthesia complications

## 2012-04-23 NOTE — Progress Notes (Addendum)
ANTICOAGULATION CONSULT NOTE - Initial Consult  Pharmacy Consult for Warfarin Indication: VTE prophylaxis  Allergies  Allergen Reactions  . Penicillins Rash    Patient Measurements: Height: 6' (182.9 cm) Weight: 249 lb (112.946 kg) IBW/kg (Calculated) : 77.6    Vital Signs: Temp: 98.6 F (37 C) (09/10 1431) Temp src: Oral (09/10 1430) BP: 151/80 mmHg (09/10 1431) Pulse Rate: 83  (09/10 1431)  Labs: No results found for this basename: HGB:2,HCT:3,PLT:3,APTT:3,LABPROT:3,INR:3,HEPARINUNFRC:3,CREATININE:3,CKTOTAL:3,CKMB:3,TROPONINI:3 in the last 72 hours  Estimated Creatinine Clearance: 116.2 ml/min (by C-G formula based on Cr of 0.72).   Medical History: Past Medical History  Diagnosis Date  . Hypertension   . H/O: rheumatic fever     age 59  . Dysrhythmia     saw cardiologist 2006 for rt bundle branch block - has not seen since  . Restless leg syndrome   . Arthritis   . RSD (reflex sympathetic dystrophy)     L hand  . GERD (gastroesophageal reflux disease)   . History of ulcer disease 1970's  . Chronic kidney disease     recent UTI / HX bladder cancer  . Cancer     bladder cancer  . Hearing loss   . Incontinence of urine   . 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 acetaminophen  15 mg/kg Intravenous Q6H  . ALPRAZolam  0.5 mg Oral BID  . diltiazem  360 mg Oral QAC breakfast  . docusate sodium  100 mg Oral BID  . enoxaparin (LOVENOX) injection  30 mg Subcutaneous Q12H  . HYDROmorphone      . imipramine  25 mg Oral QHS  . ketorolac  7.5 mg Intravenous Q6H  . Meth-Hyo-M Bl-Benz Acd-Ph Sal  81.6 mg Oral QID  . multivitamin with minerals  1 tablet Oral Daily  . nitrofurantoin  100 mg Oral QHS  . pramipexole  1-1.5 mg Oral BID  . quinapril  40 mg Oral q morning - 10a  . senna  1 tablet Oral BID  . vancomycin  1,500 mg Intravenous 120 min pre-op  . vancomycin  1,000 mg Intravenous Q12H   Infusions:    . 0.45 % NaCl  with KCl 20 mEq / L    . DISCONTD: lactated ringers 125 mL/hr at 04/23/12 1416  . DISCONTD: lactated ringers     PRN: acetaminophen, acetaminophen, alum & mag hydroxide-simeth, diphenhydrAMINE, HYDROmorphone (DILAUDID) injection, magnesium hydroxide, menthol-cetylpyridinium, methocarbamol (ROBAXIN) IV, methocarbamol, metoCLOPramide (REGLAN) injection, metoCLOPramide, ondansetron (ZOFRAN) IV, ondansetron, oxyCODONE, phenol, sorbitol, traMADol, zolpidem, DISCONTD:  HYDROmorphone (DILAUDID) injection, DISCONTD: meperidine (DEMEROL) injection, DISCONTD: promethazine DISCONTD: sodium chloride irrigation  Assessment: 66 yo M s/p TKA starting coumadin for VTE prophylaxis. Baseline INR and CBC wnl.   MD also started lovenox 30 mg Q12h until INR >/1.8   Goal of Therapy:  INR 2-3 Monitor platelets by anticoagulation protocol: Yes   Plan:  1.) Coumadin 7.5 mg po x 1 tonight at 2000 2.) Daily PT/INR 3.) Coumadin book/video/education  4.) F/U d/c lovenox when INR >/= 1.8  Dwight Adamczak, Loma Messing PharmD Pager #: [phone] 3:20 PM 04/23/2012

## 2012-04-23 NOTE — Preoperative (Signed)
Beta Blockers   Reason not to administer Beta Blockers:Not Applicable 

## 2012-04-23 NOTE — Anesthesia Postprocedure Evaluation (Signed)
  Anesthesia Post-op Note  Patient: Garrett Ferguson  Procedure(s) Performed: Procedure(s) (LRB): TOTAL KNEE REVISION (Left)  Patient Location: PACU  Anesthesia Type: General  Level of Consciousness: awake and alert   Airway and Oxygen Therapy: Patient Spontanous Breathing  Post-op Pain: mild  Post-op Assessment: Post-op Vital signs reviewed, Patient's Cardiovascular Status Stable, Respiratory Function Stable, Patent Airway and No signs of Nausea or vomiting  Post-op Vital Signs: stable  Complications: No apparent anesthesia complications

## 2012-04-23 NOTE — H&P (Signed)
PREOPERATIVE H&P  Chief Complaint: left knee failed total knee arthroplasty  HPI: Garrett Ferguson is a 66 y.o. male who presents for preoperative history and physical with a diagnosis of left knee failed total knee arthroplasty. Symptoms are rated as moderate to severe, and have been worsening.  This is significantly impairing activities of daily living.  He has elected for surgical management. He had his knee replacement done a number of years ago, and apparently this is a complicated operation taking many hours, although unfortunately he has progressively developed pain and dysfunction, with loosening of that both his femoral and tibial prosthesis based on x-rays. He had preoperative infection workup that was negative.  Past Medical History  Diagnosis Date  . Hypertension   . H/O: rheumatic fever     age 51  . Dysrhythmia     saw cardiologist 2006 for rt bundle branch block - has not seen since  . Restless leg syndrome   . Arthritis   . RSD (reflex sympathetic dystrophy)     L hand  . GERD (gastroesophageal reflux disease)   . History of ulcer disease 1970's  . Chronic kidney disease     recent UTI / HX bladder cancer  . Cancer     bladder cancer  . Hearing loss   . Incontinence of urine    Past Surgical History  Procedure Date  . Joint replacement replacements x3    l knee with multiple surg on same knee  . Cholecystectomy 1979  . Nasal sinus surgery 1990's  . Foot surgery     x5  . Transurethral resection of prostate   . Prostate biopsy   . Carpall tunnel     L hand   History   Social History  . Marital Status: Married    Spouse Name: N/A    Number of Children: N/A  . Years of Education: N/A   Social History Main Topics  . Smoking status: Never Smoker   . Smokeless tobacco: None  . Alcohol Use: No  . Drug Use: No  . Sexually Active:    Other Topics Concern  . None   Social History Narrative  . None   History reviewed. No pertinent family  history. Allergies  Allergen Reactions  . Penicillins Rash   Prior to Admission medications   Medication Sig Start Date End Date Taking? Authorizing Provider  ALPRAZolam Prudy Feeler) 0.5 MG tablet Take 0.5 mg by mouth 2 (two) times daily.   Yes Historical Provider, MD  dexlansoprazole (DEXILANT) 60 MG capsule Take 60 mg by mouth every evening.    Yes Historical Provider, MD  diltiazem (TIAZAC) 360 MG 24 hr capsule Take 360 mg by mouth daily before breakfast.    Yes Historical Provider, MD  Ferrous Sulfate (SLOW FE) 142 (45 FE) MG TBCR Take 1 tablet by mouth every morning.   Yes Historical Provider, MD  imipramine (TOFRANIL) 25 MG tablet Take 25 mg by mouth at bedtime.   Yes Historical Provider, MD  Meth-Hyo-M Bl-Benz Acd-Ph Sal (HYOPHEN PO) Take 1 tablet by mouth 4 (four) times daily.   Yes Historical Provider, MD  nitrofurantoin (MACRODANTIN) 100 MG capsule Take 100 mg by mouth at bedtime.   Yes Historical Provider, MD  OVER THE COUNTER MEDICATION Take 1 capsule by mouth daily. Sustenex probiotic   Yes Historical Provider, MD  pramipexole (MIRAPEX) 0.5 MG tablet Take 1-1.5 mg by mouth 2 (two) times daily. Takes 2 tablets in the morning and 3 tablets at night  Yes Historical Provider, MD  quinapril (ACCUPRIL) 40 MG tablet Take 40 mg by mouth every morning.   Yes Historical Provider, MD  traMADol (ULTRAM) 50 MG tablet Take 50 mg by mouth every 8 (eight) hours as needed. For pain   Yes Historical Provider, MD  Multiple Vitamin (MULTIVITAMIN WITH MINERALS) TABS Take 1 tablet by mouth daily.    Historical Provider, MD  zolpidem (AMBIEN) 10 MG tablet Take 10 mg by mouth at bedtime as needed. Sleep \    Historical Provider, MD     Positive ROS: All other systems have been reviewed and were otherwise negative with the exception of those mentioned in the HPI and as above.  Physical Exam: General: Alert, no acute distress Cardiovascular: No pedal edema Respiratory: No cyanosis, no use of accessory  musculature GI: No organomegaly, abdomen is soft and non-tender Skin: No lesions in the area of chief complaint Neurologic: Sensation intact distally Psychiatric: Patient is competent for consent with normal mood and affect Lymphatic: No axillary or cervical lymphadenopathy  MUSCULOSKELETAL: Left knee with well-healed surgical scar, clinical alignment is reasonably good, range of motion from 0-90.  Assessment: left knee failed total knee arthroplasty  Plan: Plan for Procedure(s): TOTAL KNEE REVISION  The risks benefits and alternatives were discussed with the patient including but not limited to the risks of nonoperative treatment, versus surgical intervention including infection, bleeding, nerve injury,  blood clots, cardiopulmonary complications, morbidity, mortality, among others, and they were willing to proceed. We have also discussed the risks for recurrent loosening, incomplete relief of pain, the possibility for student two-stage reimplantation with antibiotic cement insertion, if intraoperative findings suggest infection.  Shaira Sova P, MD Cell [phone] Pager [phone]  04/23/2012 7:23 AM

## 2012-04-23 NOTE — Plan of Care (Signed)
Problem: Consults Goal: Diagnosis- Total Joint Replacement Outcome: Progressing Primary Total Knee  Comments:  revision

## 2012-04-23 NOTE — Op Note (Signed)
04/23/2012  12:12 PM  PATIENT:  Garrett Ferguson    PRE-OPERATIVE DIAGNOSIS:  Failed left total knee arthroplasty  POST-OPERATIVE DIAGNOSIS:  Same  PROCEDURE:  TOTAL KNEE REVISION, including the tibial, femoral, and patellar components.  SURGEON:  Eulas Post, MD  PHYSICIAN ASSISTANT: Janace Litten, OPA-C, present and scrubbed throughout the case, critical for completion in a timely fashion, and for retraction, instrumentation, and closure.  ANESTHESIA:   General  PREOPERATIVE INDICATIONS:  Garrett Ferguson is a  66 y.o. male who has had a total of 7 previous knee operations, and has had a revision total knee replacement, done a couple of years ago, who presented with ongoing severe pain in his knee, limiting his functional daily activities. He had radiographic and clinical signs of loosening, and preoperative workup for infection was negative. He elected for surgical management.  The risks benefits and alternatives were discussed with the patient preoperatively including but not limited to the risks of infection, bleeding, nerve injury, cardiopulmonary complications, the need for revision surgery, among others, and the patient was willing to proceed.  OPERATIVE IMPLANTS: Depuy size 4 cemented tibial tray, rotating platform, with a total of 2 size 5 mm by size 4 stepped wedges underneath the tibial tray, both medially and laterally, with a 29 mm cemented metaphyseal sleeve on the tibia.  On the femur, I used a PFC Sigma femoral TC3 size 3 cemented, with a size 3 x 4 mm posterior augmentations on medial and lateral sides, as well as size 3 x 4 mm distal augmentations on both the medial and lateral side.  I used a size 5 cement restrictor on the tibia and a size 4 cement restrictor on femur.  I used a size 13 x 30 mm modular stem for the femur. I used a total of 4 bags of Smart set bone cement with gentamicin.  I used a size 3 x 15 mm rotating platform TC3 tibial insert.  There was also a  5 femoral adapter, as well as a femoral adapter bolt.  I used an oval dome 3 peg patellar button size 32 mm.  OPERATIVE FINDINGS: The femur was not grossly loose, although the tibia was definitely grossly loose. The femur did rock a bit, prior to mobilization, confirming at least substantial micromotion which could be causing pain. The tibial tray was not worn. The patellar button however had a layer of delamination, with destruction of the articulating surface, necessitating revision.  The joint fluid itself appeared normal in appearance, and had a white cell count of only 170, and all of the intraoperative Gram stain's were negative. We do not have reliable pathology at this hospital, and so cells per high power field were not obtained.  OPERATIVE PROCEDURE: The patient is brought to the operating room and placed in the supine position. General anesthesia was administered. IV vancomycin was given. A Foley was placed. He interestingly had blue urine.  The left lower extremity was prepped and draped in usual sterile fashion. Time out was performed. The leg was elevated and exsanguinated and the tourniquet was inflated. Incision was made through his previous incision, and medial and lateral flaps elevated, and a medial parapatellar approach was performed. The patella was fairly scarred in, and extremely difficult to mobilize, and could not be everted.  I mobilized the patella laterally, and removed the deep capsule and scar tissue. Prior to performing the capsulotomy, I aspirated the knee joint, and sent the fluid to the lab.  I then removed the polyethylene  insert, and examined femur. I mobilized the tissue around the prosthesis, and attached the handle to the prosthesis, and checked this, that was slightly loose, and I placed an osteotome underneath the flange, as well as around the condyles, and then had mobilized the femoral component adequately but this was able to be removed with a slap  hammer.  There was essentially no bone loss underneath the condyles, and I was very happy with the bony integrity underneath.  I turned my attention to the tibia. I placed an osteotome directly under the tray, and after the first, the tray was essentially loose. I was easily able to mobilize the tray, and deliver the tray out from underneath the femur.  I then removed all of the bony cement that was in the tibial and femoral canal, and then reamed up by hand. There was a small area of cement that was distal to the prosthesis, which I did have to drill through. Once the cement and canal had been prepared on both the femoral and tibial side, I began with freshening the tibial cut.  I inserted the reamer down, to centralize the appropriate jig, and then cut tibia removing 2 mm of bone. There was a fairly large rim of bone posteriorly on the tibia, left from previous surgeries, which I gained access to them and removed.  I then placed my tibial trial, and then prepared for the MBT tray, as well as the Cone. This required significant reaming, both by hand as well as with the drill. I suspect that I was encroaching on the posterior cortex, and elected to augment superiorly, in order to minimize the encroachment on the posterior cortex. I did go up in size on the tibial tray compared to what he had previously, as I had excellent coverage, and improved access to the posterior tibia based on my exposure.  I then placed the trial tibial tray.  I then prepared the femur. I had to drill, to prepare for the Cone on the femoral side, and then broached to the appropriate level. Once this had been set I applied the distal femoral cutting jig, and I did have to resect off about 2 mm of the distal condyles to freshen the cut.  I then assembled the distal cutting jig, and resected bone anteriorly and posteriorly, however there was not much bone at all that needed resection there, and I did not have too much bone loss  and basically the distal femoral cut anteriorly and posteriorly was left intact. I also checked the chamfer cuts which were okay.  I then placed a distal femoral trial, and it seated quite nicely. I did elect to place augments distally, given that I did resect the bone distally to freshen the cut. He previously had 4 mm augments posteriorly. Therefore I ended up having 4 mm of augments both posteriorly and distally.  I then placed a 15 mm polyethylene trial, which had excellent balance and stability. Previously he had a 17.5 mm, however the 15 mm in the setting of the augmentations restore the joint line very well, and I felt gave full extension, especially in light that preoperatively he did seem to have a slight lexion contracture.  I then turned my attention to the patella. The patellar and polyethylene had a layer of delamination all the way across from medial to lateral, and therefore I felt that revision of the patella was required. I mobilized the backside of the patella with an osteotome, and the patella came  off quite cleanly. I had a layer of cement on the patella which the cement plus the patella measured 16 mm. I used freehand technique to resect the bone cement, which left me with 14 mm of bone. I evaluated the position of the local holes for the patella, and then prepared those holes, and placed the button in place, and trialed it, and was satisfied with excellent no thumb alignment with tracking, as well as the medial lateral balance.  The trial implants were removed, the cement restrictors were placed, and the bone irrigated copiously, and the final implants were cemented into place. All loose cement was removed.  Once the cement had cured, all loose pieces of cement were removed, and the knee irrigated copiously, and the parapatellar tissue was repaired with Vicryl. I did utilize a tourniquet for 2 hours in the beginning of the case, and then had it down for approximately 30 minutes, and  then used again for approximately a half hour.  The subcutaneous tissues closed with Vicryl followed by Monocryl for the skin with Steri-Strips and sterile gauze. The tourniquet was then released, and he was awakened and returned to the PACU in stable and satisfactory condition. There were no complications and he tolerated the procedure well.

## 2012-04-23 NOTE — Progress Notes (Signed)
Utilization review completed.  

## 2012-04-23 NOTE — Anesthesia Procedure Notes (Signed)
Procedure Name: Intubation Date/Time: 04/23/2012 7:44 AM Performed by: Maris Berger T Pre-anesthesia Checklist: Patient identified, Emergency Drugs available, Suction available and Patient being monitored Patient Re-evaluated:Patient Re-evaluated prior to inductionOxygen Delivery Method: Circle System Utilized Preoxygenation: Pre-oxygenation with 100% oxygen Intubation Type: IV induction Ventilation: Mask ventilation without difficulty Laryngoscope Size: Mac and 4 Grade View: Grade I Tube type: Oral Tube size: 7.5 mm Number of attempts: 1 Airway Equipment and Method: stylet Placement Confirmation: ETT inserted through vocal cords under direct vision,  positive ETCO2 and breath sounds checked- equal and bilateral Secured at: 21 cm Tube secured with: Tape Dental Injury: Teeth and Oropharynx as per pre-operative assessment

## 2012-04-24 ENCOUNTER — Encounter (HOSPITAL_COMMUNITY): Payer: Self-pay | Admitting: Orthopedic Surgery

## 2012-04-24 LAB — CBC
HCT: 36.6 % — ABNORMAL LOW (ref 39.0–52.0)
Hemoglobin: 12.1 g/dL — ABNORMAL LOW (ref 13.0–17.0)
MCV: 90.1 fL (ref 78.0–100.0)
Platelets: 284 10*3/uL (ref 150–400)
RBC: 4.06 MIL/uL — ABNORMAL LOW (ref 4.22–5.81)
WBC: 15 10*3/uL — ABNORMAL HIGH (ref 4.0–10.5)

## 2012-04-24 LAB — BASIC METABOLIC PANEL
CO2: 27 mEq/L (ref 19–32)
Chloride: 99 mEq/L (ref 96–112)
Creatinine, Ser: 0.72 mg/dL (ref 0.50–1.35)
Glucose, Bld: 130 mg/dL — ABNORMAL HIGH (ref 70–99)

## 2012-04-24 MED ORDER — WARFARIN SODIUM 7.5 MG PO TABS
7.5000 mg | ORAL_TABLET | Freq: Once | ORAL | Status: AC
  Administered 2012-04-24: 7.5 mg via ORAL
  Filled 2012-04-24: qty 1

## 2012-04-24 NOTE — Progress Notes (Signed)
Clinical Social Work Department BRIEF PSYCHOSOCIAL ASSESSMENT 04/24/2012  Patient:  NICHALAS, COIN     Account Number:  [id number]     Admit date:  04/23/2012  Clinical Social Worker:  Candie Chroman  Date/Time:  04/24/2012 04:11 PM  Referred by:  CSW  Date Referred:  04/24/2012 Referred for  SNF Placement   Other Referral:   Interview type:  Patient Other interview type:    PSYCHOSOCIAL DATA Living Status:  WIFE Admitted from facility:   Level of care:   Primary support name:  Amaro Mangold Primary support relationship to patient:  SPOUSE Degree of support available:   supportive    CURRENT CONCERNS Current Concerns  Post-Acute Placement   Other Concerns:    SOCIAL WORK ASSESSMENT / PLAN Pt is a 66 yr old gentleman living at home prior to hospitalization. CSW met with pt/family to assist with d/c planning. ST SNF placement is needed following hospital d/c. Pt was hoping [facility]	[place] would have availability for his rehab. SNF contacted and CSW informed that there were no openings. SNF search has been initiated and bed offers provided. Pt has chosen [facility] for rehab. SNF contacted to accept bed. [employer] prior approval is pending.   Assessment/plan status:  Psychosocial Support/Ongoing Assessment of Needs Other assessment/ plan:   Information/referral to community resources:   SNF list provided.    PATIENT'S/FAMILY'S RESPONSE TO PLAN OF CARE: Pt is looking forward to rehab at [facility].   Cori Razor LCSW [phone]

## 2012-04-24 NOTE — Progress Notes (Signed)
Clinical Social Work Department CLINICAL SOCIAL WORK PLACEMENT NOTE 04/24/2012  Patient:  Garrett Ferguson, Garrett Ferguson  Account Number:  [id number] Admit date:  04/23/2012  Clinical Social Worker:  Cori Razor, LCSW  Date/time:  04/24/2012 04:21 PM  Clinical Social Work is seeking post-discharge placement for this patient at the following level of care:   SKILLED NURSING   (*CSW will update this form in Epic as items are completed)   04/24/2012  Patient/family provided with [facility] Department of Clinical Social Work's list of facilities offering this level of care within the geographic area requested by the patient (or if unable, by the patient's family).  04/24/2012  Patient/family informed of their freedom to choose among providers that offer the needed level of care, that participate in Medicare, Medicaid or managed care program needed by the patient, have an available bed and are willing to accept the patient.    Patient/family informed of [facility]' ownership interest in [facility], as well as of the fact that they are under no obligation to receive care at this facility.  PASARR submitted to EDS on 04/24/2012 PASARR number received from EDS on 04/24/2012  FL2 transmitted to all facilities in geographic area requested by pt/family on  04/24/2012 FL2 transmitted to all facilities within larger geographic area on   Patient informed that his/her managed care company has contracts with or will negotiate with  certain facilities, including the following:     Patient/family informed of bed offers received:  04/24/2012 Patient chooses bed at [facility] Physician recommends and patient chooses bed at    Patient to be transferred to [facility] on   Patient to be transferred to facility by   The following physician request were entered in Epic:   Additional Comments:  Cori Razor LCSW [phone]

## 2012-04-24 NOTE — Progress Notes (Signed)
Physical Therapy Treatment Patient Details Name: Hakan Nudelman MRN: [id number] DOB: 30-May-1946 Today's Date: 04/24/2012 Time: 4098-1191 PT Time Calculation (min): 21 min  PT Assessment / Plan / Recommendation Comments on Treatment Session  POD #1 pm session.  Amb pt in hallway then assisted back to bed.  Pt progressing well and plans to D/C to SNF for rehab.    Follow Up Recommendations  Skilled nursing facility    Barriers to Discharge        Equipment Recommendations  Defer to next venue    Recommendations for Other Services    Frequency 7X/week   Plan Discharge plan remains appropriate    Precautions / Restrictions Precautions Precautions: Knee Precaution Comments: Pt instructed on KI use for amb Required Braces or Orthoses: Knee Immobilizer - Left Knee Immobilizer - Left: Discontinue once straight leg raise with < 10 degree lag Restrictions Weight Bearing Restrictions: No LLE Weight Bearing: Weight bearing as tolerated    Pertinent Vitals/Pain C/o 5/10 knee pain    Mobility  Bed Mobility Bed Mobility: Sit to Supine Sit to Supine: 3: Mod assist Details for Bed Mobility Assistance: Assisted pt back to bed  Transfers Transfers: Sit to Stand;Stand to Sit Sit to Stand: 4: Min assist;From chair/3-in-1 Stand to Sit: 4: Min assist;To bed Details for Transfer Assistance: 50% VC's on hand placement and safety with turns.  Ambulation/Gait Ambulation/Gait Assistance: 4: Min assist Ambulation Distance (Feet): 55 Feet Assistive device: Rolling walker Ambulation/Gait Assistance Details: 50% VC's on proper sequencing and increased time Gait Pattern: Step-to pattern;Decreased stance time - left Gait velocity: decreased     PT Goals                            progressing   Visit Information  Last PT Received On: 04/24/12 Assistance Needed: +1    Subjective Data  Subjective: I feel good Patient Stated Goal: Rehab             End of Session PT - End of  Session Equipment Utilized During Treatment: Gait belt Activity Tolerance: Patient tolerated treatment well Patient left: in bed;with call bell/phone within reach   Felecia Shelling  PTA [facility]  Acute  Rehab Pager     [phone]

## 2012-04-24 NOTE — Progress Notes (Signed)
ANTICOAGULATION CONSULT NOTE   Pharmacy Consult for Warfarin Indication: VTE prophylaxis  Allergies  Allergen Reactions  . Penicillins Rash    Patient Measurements: Height: 6' (182.9 cm) Weight: 249 lb (112.946 kg) IBW/kg (Calculated) : 77.6    Vital Signs: Temp: 98 F (36.7 C) (09/11 0540) Temp src: Oral (09/11 0540) BP: 113/71 mmHg (09/11 0540) Pulse Rate: 86  (09/11 0540)  Labs:  Basename 04/24/12 0430  HGB 12.1*  HCT 36.6*  PLT 284  APTT --  LABPROT 13.5  INR 1.01  HEPARINUNFRC --  CREATININE 0.72  CKTOTAL --  CKMB --  TROPONINI --    Estimated Creatinine Clearance: 116.2 ml/min (by C-G formula based on Cr of 0.72).   Medical History: Past Medical History  Diagnosis Date  . Hypertension   . H/O: rheumatic fever     age 29  . Dysrhythmia     saw cardiologist 2006 for rt bundle branch block - has not seen since  . Restless leg syndrome   . Arthritis   . RSD (reflex sympathetic dystrophy)     L hand  . GERD (gastroesophageal reflux disease)   . History of ulcer disease 1970's  . Chronic kidney disease     recent UTI / HX bladder cancer  . Cancer     bladder cancer  . Hearing loss   . Incontinence of urine   . 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 acetaminophen  1,000 mg Intravenous Q6H  . ALPRAZolam  0.5 mg Oral BID  . coumadin book   Does not apply Once  . diltiazem  360 mg Oral Daily  . docusate sodium  100 mg Oral BID  . enoxaparin (LOVENOX) injection  30 mg Subcutaneous BID  . HYDROmorphone      . HYDROmorphone      . imipramine  25 mg Oral QHS  . ketorolac  7.5 mg Intravenous Q6H  . lisinopril  40 mg Oral Daily  . multivitamin with minerals  1 tablet Oral Daily  . nitrofurantoin  100 mg Oral QHS  . pramipexole  1 mg Oral Q breakfast  . pramipexole  1.5 mg Oral QHS  . senna  1 tablet Oral BID  . URELLE  1 tablet Oral QID  . vancomycin  1,000 mg Intravenous Q12H  . warfarin  7.5 mg Oral  Once  . warfarin   Does not apply Once  . Warfarin - Pharmacist Dosing Inpatient   Does not apply q1800  . DISCONTD: diltiazem  360 mg Oral QAC breakfast  . DISCONTD: Meth-Hyo-M Bl-Benz Acd-Ph Sal  81.6 mg Oral QID  . DISCONTD: pramipexole  1-1.5 mg Oral BID  . DISCONTD: quinapril  40 mg Oral q morning - 10a   Infusions:     . 0.45 % NaCl with KCl 20 mEq / L 75 mL/hr at 04/24/12 0439  . DISCONTD: lactated ringers 125 mL/hr at 04/23/12 1416  . DISCONTD: lactated ringers     Assessment:  66 yo M s/p TKA starting coumadin for VTE prophylaxis. Baseline INR and CBC wnl.   MD also started lovenox 30 mg Q12h until INR >/1.8   INR remains subtherapeutic as expected after only one dose.  CBC shows Hgb 12.1, Plt 284  Goal of Therapy:  INR 2-3 Monitor platelets by anticoagulation protocol: Yes   Plan:   Repeat warfarin 7.5 mg PO tonight  Daily PT/INR  Coumadin book/video/education   F/U  d/c lovenox when INR >/= 1.8    Lynann Beaver PharmD, BCPS Pager [phone] 04/24/2012 9:29 AM

## 2012-04-24 NOTE — Evaluation (Addendum)
Physical Therapy Evaluation Patient Details Name: Garrett Ferguson MRN: [id number] DOB: 09/22/1945 Today's Date: 04/24/2012 Time: 4098-1191 PT Time Calculation (min): 39 min  PT Assessment / Plan / Recommendation Clinical Impression  66 y.o. male s/p L TKR revision.  Pt doing well with mobility, min assist for supine to sit, min assist sit to stand. Pt ambulated 8' with RW and min A. ST-SNF recommended as pt will not have 24* assist at home. Good progress expected. Pt would benefit from acute PT to maximize safety and independence with mobility.     PT Assessment  Patient needs continued PT services    Follow Up Recommendations  Skilled nursing facility;Supervision for mobility/OOB    Barriers to Discharge None      Equipment Recommendations  Defer to next venue    Recommendations for Other Services OT consult   Frequency 7X/week    Precautions / Restrictions Precautions Required Braces or Orthoses: Knee Immobilizer - Left Knee Immobilizer - Left: Discontinue once straight leg raise with < 10 degree lag Restrictions Weight Bearing Restrictions: No LLE Weight Bearing: Weight bearing as tolerated   Pertinent Vitals/Pain *4/10 L knee after walking/exercise Ice applied, pt premedicated, LLE elevated**      Mobility  Bed Mobility Bed Mobility: Supine to Sit Supine to Sit: 4: Min assist Details for Bed Mobility Assistance: min A to support LLE and elevate trunk, VCs for hand placement with scooting to EOB Transfers Transfers: Sit to Stand;Stand to Sit Sit to Stand: From bed;4: Min assist Stand to Sit: To chair/3-in-1;4: Min assist Details for Transfer Assistance: VCs hand placement and for set up, VCs to extend LLE prior to sitting, assist to clear bed and to control descent Ambulation/Gait Ambulation/Gait Assistance: 4: Min assist Ambulation Distance (Feet): 8 Feet Assistive device: Rolling walker Gait Pattern: Step-to pattern;Decreased step length - right;Decreased step  length - left    Exercises Total Joint Exercises Ankle Circles/Pumps: AROM;Both;10 reps;Supine Quad Sets: AROM;Both;5 reps;Supine Heel Slides: AAROM;Left;10 reps;Supine   PT Diagnosis: Difficulty walking;Acute pain  PT Problem List: Decreased strength;Decreased range of motion;Decreased mobility;Pain;Decreased knowledge of use of DME PT Treatment Interventions: Therapeutic exercise;Therapeutic activities;Patient/family education;Gait training;DME instruction   PT Goals Acute Rehab PT Goals PT Goal Formulation: With patient Time For Goal Achievement: 04/27/12 Potential to Achieve Goals: Good Pt will go Supine/Side to Sit: with modified independence;with HOB 0 degrees PT Goal: Supine/Side to Sit - Progress: Goal set today Pt will go Sit to Stand: with supervision PT Goal: Sit to Stand - Progress: Goal set today Pt will Ambulate: 51 - 150 feet;with supervision;with rolling walker PT Goal: Ambulate - Progress: Goal set today Pt will Perform Home Exercise Program: with supervision, verbal cues required/provided PT Goal: Perform Home Exercise Program - Progress: Goal set today  Visit Information  Last PT Received On: 04/24/12 Assistance Needed: +1    Subjective Data  Subjective: I think I need to go to rehab somewhere. My wife works.  Patient Stated Goal: return to prior level of function   Prior Functioning  Home Living Lives With: Spouse Type of Home: House Home Access: Stairs to enter Secretary/administrator of Steps: 4 Entrance Stairs-Rails: Right Home Adaptive Equipment: Bedside commode/3-in-1;Walker - rolling Additional Comments: pt stated church can loan him 3-in-1 and walker Prior Function Level of Independence: Independent Able to Take Stairs?: Yes Vocation: Full time employment Comments: pastor Communication Communication: No difficulties    Cognition  Overall Cognitive Status: Appears within functional limits for tasks assessed/performed Arousal/Alertness:  Awake/alert Orientation Level:  Appears intact for tasks assessed Behavior During Session: [facility] for tasks performed    Extremity/Trunk Assessment Right Upper Extremity Assessment RUE ROM/Strength/Tone: [facility] for tasks assessed Left Upper Extremity Assessment LUE ROM/Strength/Tone: WFL for tasks assessed Right Lower Extremity Assessment RLE ROM/Strength/Tone: Within functional levels RLE Sensation: WFL - Light Touch RLE Coordination: WFL - gross/fine motor Left Lower Extremity Assessment LLE ROM/Strength/Tone: Deficits LLE ROM/Strength/Tone Deficits: knee flexion AAROM 30*, SLR -2/5 LLE Sensation: Deficits LLE Sensation Deficits: decreased to light touch medial foot LLE Coordination: WFL - gross/fine motor Trunk Assessment Trunk Assessment: Normal   Balance Balance Balance Assessed: Yes Static Sitting Balance Static Sitting - Balance Support: Left upper extremity supported Static Sitting - Level of Assistance: 6: Modified independent (Device/Increase time) Static Sitting - Comment/# of Minutes: 4  End of Session PT - End of Session Equipment Utilized During Treatment: Gait belt;Left knee immobilizer Activity Tolerance: Patient tolerated treatment well Patient left: in chair;with call bell/phone within reach;with nursing in room Nurse Communication: Mobility status  GP     Ralene Bathe Kistler 04/24/2012, 9:27 AM [phone]

## 2012-04-24 NOTE — Progress Notes (Signed)
Patient ID: Garrett Ferguson, male   DOB: July 25, 1946, 66 y.o.   MRN: [id number]     Subjective:  Patient reports pain as mild to moderate.  He denies any CP or SOB sitting up eating breakfast  Objective:   VITALS:   Filed Vitals:   04/23/12 2127 04/24/12 0148 04/24/12 0540 04/24/12 0742  BP: 103/67 103/65 113/71   Pulse: 88 83 86   Temp: 98 F (36.7 C) 98.2 F (36.8 C) 98 F (36.7 C)   TempSrc: Oral Oral Oral   Resp: 20 20 20 18   Height:      Weight:      SpO2: 97% 98% 99%     ABD soft Sensation intact distally Dorsiflexion/Plantar flexion intact Incision: dressing C/D/I and no drainage  LABS  Results for orders placed during the hospital encounter of 04/23/12 (from the past 24 hour(s))  ANAEROBIC CULTURE     Status: Normal (Preliminary result)   Collection Time   04/23/12  9:43 AM      Component Value Range   Specimen Description TISSUE LT KNEE TIBIA     Special Requests NONE     Gram Stain       Value: RARE WBC PRESENT,BOTH PMN AND MONONUCLEAR     NO ORGANISMS SEEN     Performed by [facility]   Culture PENDING     Report Status PENDING    TISSUE CULTURE     Status: Normal (Preliminary result)   Collection Time   04/23/12  9:43 AM      Component Value Range   Specimen Description TISSUE LT KNEE TIBIA     Special Requests NONE     Gram Stain       Value: RARE WBC PRESENT,BOTH PMN AND MONONUCLEAR     NO ORGANISMS SEEN     Gram Stain Report Called to,Read Back By and Verified With: Gram Stain Report Called to,Read Back By and Verified With: ALLREDW 1120 04/23/12 BY MURPHYD Performed by [facility]   Culture NO GROWTH 1 DAY     Report Status PENDING    GRAM STAIN     Status: Normal   Collection Time   04/23/12  9:43 AM      Component Value Range   Specimen Description TISSUE LT KNEE TIBIA     Special Requests NONE     Gram Stain       Value: RARE WBC PRESENT,BOTH PMN AND MONONUCLEAR     NO ORGANISMS SEEN     Gram Stain Report Called to,Read  Back By and Verified With: ALLREDW/1120/091013/MURPHYD   Report Status 04/23/2012 FINAL    ANAEROBIC CULTURE     Status: Normal (Preliminary result)   Collection Time   04/23/12  9:46 AM      Component Value Range   Specimen Description TISSUE LT FEMUR     Special Requests NONE     Gram Stain       Value: FEW WBC PRESENT,BOTH PMN AND MONONUCLEAR     NO ORGANISMS SEEN     Performed by [facility]   Culture PENDING     Report Status PENDING    TISSUE CULTURE     Status: Normal (Preliminary result)   Collection Time   04/23/12  9:46 AM      Component Value Range   Specimen Description TISSUE LT FEMUR     Special Requests NONE     Gram Stain  Value: FEW WBC PRESENT,BOTH PMN AND MONONUCLEAR     NO ORGANISMS SEEN     Gram Stain Report Called to,Read Back By and Verified With: Gram Stain Report Called to,Read Back By and Verified With: ALLREDW 1120 04/23/12 BY MURPHYD Performed by [facility]   Culture NO GROWTH 1 DAY     Report Status PENDING    GRAM STAIN     Status: Normal   Collection Time   04/23/12  9:46 AM      Component Value Range   Specimen Description TISSUE LT FEMUR     Special Requests NONE     Gram Stain       Value: FEW WBC PRESENT,BOTH PMN AND MONONUCLEAR     NO ORGANISMS SEEN     Gram Stain Report Called to,Read Back By and Verified With: ALLREDW/1120/091013/MURPHYD   Report Status 04/23/2012 FINAL    PROTIME-INR     Status: Normal   Collection Time   04/24/12  4:30 AM      Component Value Range   Prothrombin Time 13.5  11.6 - 15.2 seconds   INR 1.01  0.00 - 1.49  CBC     Status: Abnormal   Collection Time   04/24/12  4:30 AM      Component Value Range   WBC 15.0 (*) 4.0 - 10.5 K/uL   RBC 4.06 (*) 4.22 - 5.81 MIL/uL   Hemoglobin 12.1 (*) 13.0 - 17.0 g/dL   HCT 13.0 (*) 86.5 - 78.4 %   MCV 90.1  78.0 - 100.0 fL   MCH 29.8  26.0 - 34.0 pg   MCHC 33.1  30.0 - 36.0 g/dL   RDW 69.6  29.5 - 28.4 %   Platelets 284  150 - 400 K/uL  BASIC  METABOLIC PANEL     Status: Abnormal   Collection Time   04/24/12  4:30 AM      Component Value Range   Sodium 132 (*) 135 - 145 mEq/L   Potassium 4.5  3.5 - 5.1 mEq/L   Chloride 99  96 - 112 mEq/L   CO2 27  19 - 32 mEq/L   Glucose, Bld 130 (*) 70 - 99 mg/dL   BUN 16  6 - 23 mg/dL   Creatinine, Ser 1.32  0.50 - 1.35 mg/dL   Calcium 8.5  8.4 - 44.0 mg/dL   GFR calc non Af Amer >90  >90 mL/min   GFR calc Af Amer >90  >90 mL/min    X-ray Knee Left Port  04/23/2012  *RADIOLOGY REPORT*  Clinical Data: Postop  PORTABLE LEFT KNEE - 1-2 VIEW  Comparison: None.  Findings: Revision total knee arthroplasty is in place.  Anatomic alignment of the osseous and prosthetic structures.  Gas and fluid are present in the knee joint.  No breakage or loosening of the hardware.  IMPRESSION: Revision total knee arthroplasty anatomically aligned.   Original Report Authenticated By: Donavan Burnet, M.D.     Assessment/Plan: 1 Day Post-Op   Principal Problem:  *Failed total left knee replacement   Advance diet Up with therapy Will plan for SNF later in the week   Abie Cheek P 04/24/2012, 9:09 AM   Teryl Lucy, MD Cell [phone] Pager [phone]

## 2012-04-25 LAB — PROTIME-INR
INR: 1.14 (ref 0.00–1.49)
Prothrombin Time: 14.8 seconds (ref 11.6–15.2)

## 2012-04-25 LAB — CBC
Hemoglobin: 11.1 g/dL — ABNORMAL LOW (ref 13.0–17.0)
MCHC: 32.4 g/dL (ref 30.0–36.0)
Platelets: 271 10*3/uL (ref 150–400)
RDW: 13.6 % (ref 11.5–15.5)

## 2012-04-25 MED ORDER — WARFARIN SODIUM 7.5 MG PO TABS
7.5000 mg | ORAL_TABLET | Freq: Once | ORAL | Status: AC
  Administered 2012-04-25: 7.5 mg via ORAL
  Filled 2012-04-25: qty 1

## 2012-04-25 NOTE — Evaluation (Signed)
Occupational Therapy Evaluation Patient Details Name: Garrett Ferguson MRN: [id number] DOB: 05-19-1946 Today's Date: 04/25/2012 Time: 4098-1191 OT Time Calculation (min): 28 min  OT Assessment / Plan / Recommendation Clinical Impression  Pleasant 66yr old male admitted for elective RTKA.  Pt currently needs at least mod facilitation for selfcare tasks with min assist for transfers.  Will benefit from acute OT at this time in order to reach a safe level for home.  Will benefit from short term SNF for follow-up rehab to rach modified independent level.    OT Assessment  Patient needs continued OT Services    Follow Up Recommendations  Skilled nursing facility    Barriers to Discharge Inaccessible home environment    Equipment Recommendations  Defer to next venue       Frequency  Min 2X/week    Precautions / Restrictions Precautions Precautions: Knee Required Braces or Orthoses: Knee Immobilizer - Right Knee Immobilizer - Right: On at all times Restrictions Weight Bearing Restrictions: No LLE Weight Bearing: Weight bearing as tolerated   Pertinent Vitals/Pain Pain 6/10, meds given earlier    ADL  Eating/Feeding: Simulated;Independent Where Assessed - Eating/Feeding: Chair Grooming: Performed;Minimal assistance Where Assessed - Grooming: Supported standing Upper Body Bathing: Simulated;Set up Where Assessed - Upper Body Bathing: Unsupported sitting Lower Body Bathing: Simulated;Moderate assistance Where Assessed - Lower Body Bathing: Supported sit to stand Upper Body Dressing: Simulated Where Assessed - Upper Body Dressing: Unsupported sitting Lower Body Dressing: Simulated;Moderate assistance Where Assessed - Lower Body Dressing: Supported sit to stand Toilet Transfer: Performed;Minimal assistance Toilet Transfer Method: Other (comment) (Ambulate with Rw) Acupuncturist: Bedside commode Toileting - Clothing Manipulation and Hygiene: Performed;Minimal  assistance Where Assessed - Engineer, mining and Hygiene: Sit to stand from 3-in-1 or toilet Tub/Shower Transfer Method: Not assessed Equipment Used: Rolling walker;Knee Immobilizer Transfers/Ambulation Related to ADLs: Pt overall min assist for functional transfers using the RW. ADL Comments: Pt with decreased ability to reach either foot for simulated bathing and dressing tasks.  Will benefit from increased practice.  Mod instructional cueing initially for walker/stepping sequence.    OT Diagnosis: Generalized weakness;Acute pain  OT Problem List: Decreased strength;Decreased activity tolerance;Impaired balance (sitting and/or standing);Decreased knowledge of use of DME or AE;Pain OT Treatment Interventions: Self-care/ADL training;DME and/or AE instruction;Balance training;Patient/family education;Therapeutic activities   OT Goals Acute Rehab OT Goals OT Goal Formulation: With patient Time For Goal Achievement: 04/25/12 Potential to Achieve Goals: Good ADL Goals Pt Will Perform Grooming: with supervision;Unsupported;Standing at sink ADL Goal: Grooming - Progress: Goal set today Pt Will Perform Lower Body Bathing: with supervision;Unsupported;Sit to stand from bed ADL Goal: Lower Body Bathing - Progress: Goal set today Pt Will Perform Lower Body Dressing: Supported;Sit to stand from bed ADL Goal: Lower Body Dressing - Progress: Goal set today Pt Will Transfer to Toilet: with DME;3-in-1;Ambulation;with supervision ADL Goal: Toilet Transfer - Progress: Goal set today  Visit Information  Last OT Received On: 04/25/12 Assistance Needed: +1    Subjective Data  Subjective: "My house is real small so I'm going to go to rehab at [facility] Patient Stated Goal: To not have any more hip replacements.   Prior Functioning  Vision/Perception  Home Living Lives With: Spouse Type of Home: House Home Access: Stairs to enter Secretary/administrator of Steps: 4 Entrance  Stairs-Rails: Right Home Adaptive Equipment: Bedside commode/3-in-1;Walker - rolling Additional Comments: pt stated church can loan him 3-in-1 and walker Prior Function Level of Independence: Independent Able to Take Stairs?: Yes Vocation:  Full time employment Communication Communication: No difficulties   Vision - Assessment Eye Alignment: Within Functional Limits Vision Assessment: Vision not tested Perception Perception: Within Functional Limits Praxis Praxis: Intact  Cognition  Overall Cognitive Status: Appears within functional limits for tasks assessed/performed Arousal/Alertness: Awake/alert Orientation Level: Appears intact for tasks assessed Behavior During Session: [facility] for tasks performed    Extremity/Trunk Assessment Right Upper Extremity Assessment RUE ROM/Strength/Tone: Within functional levels Left Upper Extremity Assessment LUE ROM/Strength/Tone: Within functional levels Trunk Assessment Trunk Assessment: Normal   Mobility  Shoulder Instructions  Bed Mobility Bed Mobility: Supine to Sit Supine to Sit: 4: Min assist;HOB flat Transfers Transfers: Sit to Stand Sit to Stand: With upper extremity assist;4: Min assist Stand to Sit: 4: Min assist;Without upper extremity assist          Balance Balance Balance Assessed: Yes Dynamic Standing Balance Dynamic Standing - Balance Support: Right upper extremity supported;Left upper extremity supported Dynamic Standing - Level of Assistance: 5: Stand by assistance   End of Session OT - End of Session Activity Tolerance: Patient tolerated treatment well Patient left: in chair;with call bell/phone within reach Nurse Communication: Mobility status     Garrett Ferguson OTR/L 04/25/2012, 9:27 AM Pager number [phone]

## 2012-04-25 NOTE — Progress Notes (Signed)
FL2 in chart for MD signature. Pt has chosen [facility] for [facility]. SNF is working with [employer] for prior approval. CSW will continue to follow to assist with d/c planning needs.  Cori Razor LCSW [phone]

## 2012-04-25 NOTE — Progress Notes (Signed)
Physical Therapy Treatment Patient Details Name: Antony Pyatt MRN: [id number] DOB: 01/13/1946 Today's Date: 04/25/2012 Time: 1540-1600 PT Time Calculation (min): 20 min  PT Assessment / Plan / Recommendation Comments on Treatment Session  POD #2 pm session, amb pt in hallway then assited back to bed.  Pt plans to D/C to [facility].    Follow Up Recommendations  Skilled nursing facility    Barriers to Discharge        Equipment Recommendations  Defer to next venue    Recommendations for Other Services    Frequency 7X/week   Plan Discharge plan remains appropriate    Precautions / Restrictions Precautions Precautions: Knee Precaution Comments: Pt instructed on KI use for amb Required Braces or Orthoses: Knee Immobilizer - Left Knee Immobilizer - Left: Discontinue once straight leg raise with < 10 degree lag Restrictions Weight Bearing Restrictions: No LLE Weight Bearing: Weight bearing as tolerated    Pertinent Vitals/Pain C/o 3/10 knee pain    Mobility  Bed Mobility Bed Mobility: Sit to Supine Details for Bed Mobility Assistance: Assisted pt back to bed @ min assist to support l LE Transfers Transfers: Sit to Stand;Stand to Sit Sit to Stand: 4: Min assist;4: Min guard;From chair/3-in-1 Stand to Sit: 4: Min guard;4: Min assist;To bed Details for Transfer Assistance: 25% VC's on proper tech and increased time Ambulation/Gait Ambulation/Gait Assistance: 4: Min guard;4: Min Environmental consultant (Feet): 75 Feet Assistive device: Rolling walker Ambulation/Gait Assistance Details: increased time Gait Pattern: Step-to pattern;Decreased stance time - left Gait velocity: decreased     PT Goals   progressing    Visit Information  Last PT Received On: 04/25/12 Assistance Needed: +1                   End of Session PT - End of Session Equipment Utilized During Treatment: Gait belt;Left knee immobilizer Activity Tolerance: Patient tolerated  treatment well Patient left: in bed;with call bell/phone within reach;with family/visitor present   Felecia Shelling  PTA [facility]  Acute  Rehab Pager     [phone]

## 2012-04-25 NOTE — Progress Notes (Signed)
Physical Therapy Treatment Patient Details Name: Smiley Birr MRN: [id number] DOB: 1946-04-23 Today's Date: 04/25/2012 Time: 4098-1191 PT Time Calculation (min): 30 min  PT Assessment / Plan / Recommendation Comments on Treatment Session  POD #2 am session.  Amb in hallway then performed TKR TE's.  Pt plans to D/C to SNF tomorrow.    Follow Up Recommendations  Skilled nursing facility    Barriers to Discharge        Equipment Recommendations  Defer to next venue    Recommendations for Other Services    Frequency 7X/week   Plan Discharge plan remains appropriate    Precautions / Restrictions Precautions Precautions: Knee Precaution Comments: Pt instructed on KI use for amb Required Braces or Orthoses: Knee Immobilizer - Left Knee Immobilizer - Left: Discontinue once straight leg raise with < 10 degree lag Restrictions Weight Bearing Restrictions: No LLE Weight Bearing: Weight bearing as tolerated    Pertinent Vitals/Pain C/o 5/10 during TE's ICE applied    Mobility  Bed Mobility Bed Mobility: Not assessed Details for Bed Mobility Assistance: Pt OOB in recliner  Transfers Transfers: Sit to Stand;Stand to Sit Sit to Stand: 4: Min assist;From chair/3-in-1 Stand to Sit: 4: Min assist;To chair/3-in-1 Details for Transfer Assistance: 50% VC's on hand placement and safety with turns.   Ambulation/Gait Ambulation/Gait Assistance: 4: Min assist Ambulation Distance (Feet): 85 Feet Assistive device: Rolling walker Ambulation/Gait Assistance Details: 25% VC's on proper walker to self distance and increased time Gait Pattern: Step-to pattern;Decreased stance time - left Gait velocity: decreased    Exercises Total Joint Exercises Ankle Circles/Pumps: AROM;Both;10 reps;Supine Quad Sets: AROM;Both;10 reps;Supine Gluteal Sets: AROM;Both;10 reps;Supine Towel Squeeze: AROM;Both;10 reps;Supine Short Arc Quad: AROM;Both;10 reps;Supine Heel Slides: AAROM;Left;10 reps;Supine Hip  ABduction/ADduction: AAROM;Left;10 reps;Supine Straight Leg Raises: AAROM;Left;10 reps;Supine    PT Goals                           progressing    Visit Information  Last PT Received On: 04/25/12 Assistance Needed: +1                   End of Session PT - End of Session Equipment Utilized During Treatment: Gait belt Activity Tolerance: Patient tolerated treatment well Patient left: in chair;with call bell/phone within reach   Felecia Shelling  PTA [facility]  Acute  Rehab Pager     [phone]

## 2012-04-25 NOTE — Progress Notes (Signed)
ANTICOAGULATION CONSULT NOTE   Pharmacy Consult for Warfarin Indication: VTE prophylaxis  Allergies  Allergen Reactions  . Penicillins Rash    Patient Measurements: Height: 6' (182.9 cm) Weight: 249 lb (112.946 kg) IBW/kg (Calculated) : 77.6    Vital Signs: Temp: 97.6 F (36.4 C) (09/12 0440) Temp src: Oral (09/12 0440) BP: 127/78 mmHg (09/12 0440) Pulse Rate: 77  (09/12 0440)  Labs:  Basename 04/25/12 0420 04/24/12 0430  HGB 11.1* 12.1*  HCT 34.3* 36.6*  PLT 271 284  APTT -- --  LABPROT 14.8 13.5  INR 1.14 1.01  HEPARINUNFRC -- --  CREATININE -- 0.72  CKTOTAL -- --  CKMB -- --  TROPONINI -- --    Estimated Creatinine Clearance: 116.2 ml/min (by C-G formula based on Cr of 0.72).   Medical History: Past Medical History  Diagnosis Date  . Hypertension   . H/O: rheumatic fever     age 72  . Dysrhythmia     saw cardiologist 2006 for rt bundle branch block - has not seen since  . Restless leg syndrome   . Arthritis   . RSD (reflex sympathetic dystrophy)     L hand  . GERD (gastroesophageal reflux disease)   . History of ulcer disease 1970's  . Chronic kidney disease     recent UTI / HX bladder cancer  . Cancer     bladder cancer  . Hearing loss   . Incontinence of urine   . 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 acetaminophen  1,000 mg Intravenous Q6H  . ALPRAZolam  0.5 mg Oral BID  . coumadin book   Does not apply Once  . diltiazem  360 mg Oral Daily  . docusate sodium  100 mg Oral BID  . enoxaparin (LOVENOX) injection  30 mg Subcutaneous BID  . imipramine  25 mg Oral QHS  . ketorolac  7.5 mg Intravenous Q6H  . lisinopril  40 mg Oral Daily  . multivitamin with minerals  1 tablet Oral Daily  . nitrofurantoin  100 mg Oral QHS  . pramipexole  1 mg Oral Q breakfast  . pramipexole  1.5 mg Oral QHS  . senna  1 tablet Oral BID  . URELLE  1 tablet Oral QID  . warfarin  7.5 mg Oral ONCE-1800  . warfarin    Does not apply Once  . Warfarin - Pharmacist Dosing Inpatient   Does not apply q1800   Infusions:     . 0.45 % NaCl with KCl 20 mEq / L 75 mL/hr at 04/24/12 1719   Assessment:  66 yo M s/p TKA starting coumadin for VTE prophylaxis. Baseline INR and CBC wnl.  Also on lovenox 30 mg Q12h until INR >/1.8   INR remains subtherapeutic, expected during initation  CBC stable, no bleeding reported  Goal of Therapy:  INR 2-3 Monitor platelets by anticoagulation protocol: Yes   Plan:   Repeat warfarin 7.5 mg PO tonight  Daily PT/INR  F/U d/c lovenox when INR >/= 1.8  Idalys Konecny, Loma Messing PharmD Pager #: [phone] 10:04 AM 04/25/2012

## 2012-04-25 NOTE — Progress Notes (Signed)
Pharmacy: Coumadin   Patient was educated regarding the use of coumadin for VTE prophylaxis.  Patient is also on lovenox 30 mg BID until INR >/= 1.8  Note - Patient reported "occasional bleeding from bladder cancer and hemorrhoids" in the past.  He reports no bleeding at this current time.  He was instructed to keep a very close eye on any s/sx bleeding.  CBC stable.   BorgerdingLoma Messing PharmD Pager #: [phone] 2:37 PM 04/25/2012

## 2012-04-25 NOTE — Progress Notes (Signed)
FL2 in chart for MD signature.CSW met with pt/family to provide d/c planning update.  [facility] continues to work with [employer] for prior approval for [facility] SN[facility] placement.   Cori Razor LCSW [phone]

## 2012-04-26 LAB — TISSUE CULTURE
Culture: NO GROWTH
Culture: NO GROWTH

## 2012-04-26 LAB — PROTIME-INR
INR: 1.19 (ref 0.00–1.49)
Prothrombin Time: 15.4 seconds — ABNORMAL HIGH (ref 11.6–15.2)

## 2012-04-26 LAB — BODY FLUID CULTURE: Culture: NO GROWTH

## 2012-04-26 LAB — CBC
Hemoglobin: 11.3 g/dL — ABNORMAL LOW (ref 13.0–17.0)
MCHC: 32.1 g/dL (ref 30.0–36.0)
Platelets: 269 10*3/uL (ref 150–400)
RDW: 13.5 % (ref 11.5–15.5)

## 2012-04-26 MED ORDER — WARFARIN SODIUM 10 MG PO TABS
10.0000 mg | ORAL_TABLET | ORAL | Status: AC
  Administered 2012-04-26: 10 mg via ORAL
  Filled 2012-04-26: qty 1

## 2012-04-26 MED ORDER — ENOXAPARIN SODIUM 40 MG/0.4ML ~~LOC~~ SOLN: 40.0000 mg | SUBCUTANEOUS | Status: DC

## 2012-04-26 NOTE — Progress Notes (Signed)
ANTICOAGULATION CONSULT NOTE   Pharmacy Consult for Warfarin Indication: VTE prophylaxis  Allergies  Allergen Reactions  . Penicillins Rash    Patient Measurements: Height: 6' (182.9 cm) Weight: 249 lb (112.946 kg) IBW/kg (Calculated) : 77.6    Vital Signs: Temp: 98.3 F (36.8 C) (09/13 0613) Temp src: Oral (09/13 0613) BP: 173/81 mmHg (09/13 0613) Pulse Rate: 103  (09/13 0613)  Labs:  Basename 04/26/12 0405 04/25/12 0420 04/24/12 0430  HGB 11.3* 11.1* --  HCT 35.2* 34.3* 36.6*  PLT 269 271 284  APTT -- -- --  LABPROT 15.4* 14.8 13.5  INR 1.19 1.14 1.01  HEPARINUNFRC -- -- --  CREATININE -- -- 0.72  CKTOTAL -- -- --  CKMB -- -- --  TROPONINI -- -- --    Estimated Creatinine Clearance: 116.2 ml/min (by C-G formula based on Cr of 0.72).   Medications:  Scheduled:     . ALPRAZolam  0.5 mg Oral BID  . diltiazem  360 mg Oral Daily  . docusate sodium  100 mg Oral BID  . enoxaparin (LOVENOX) injection  30 mg Subcutaneous BID  . imipramine  25 mg Oral QHS  . lisinopril  40 mg Oral Daily  . multivitamin with minerals  1 tablet Oral Daily  . nitrofurantoin  100 mg Oral QHS  . pramipexole  1 mg Oral Q breakfast  . pramipexole  1.5 mg Oral QHS  . senna  1 tablet Oral BID  . URELLE  1 tablet Oral QID  . warfarin  10 mg Oral NOW  . warfarin  7.5 mg Oral ONCE-1800  . Warfarin - Pharmacist Dosing Inpatient   Does not apply q1800   Infusions:     . 0.45 % NaCl with KCl 20 mEq / L 20 mL/hr at 04/25/12 1650   Assessment:  66 yo M s/p TKA starting coumadin for VTE prophylaxis. Baseline INR and CBC wnl.  Also on lovenox 30 mg Q12h until INR >/1.8   INR remains subtherapeutic after 3 doses  CBC stable, no bleeding reported  Inpatient doses: 7.5mg  9/10, 9/11, 9/12  Goal of Therapy:  INR 2-3 Monitor platelets by anticoagulation protocol: Yes   Plan:   Coumadin 10mg  po x 1 NOW, therefore no coumadin will be needed until tomorrow 9/14.  Patient's discharge  prescription is for Coumadin 5mg  daily - however, expect that he will need higher doses based on response so far.  Recommend next INR be checked tomorrow for dose adjustment at [facility].  Also to receive lovenox 40mg  q24h until INR >/= 1.8  Loralee Pacas, PharmD, BCPS Pager: [phone] 9:49 AM 04/26/2012

## 2012-04-26 NOTE — Discharge Summary (Signed)
Physician Discharge Summary  Patient ID: Garrett Ferguson MRN: [id number] DOB/AGE: 1945-10-14 66 y.o.  Admit date: 04/23/2012 Discharge date: 04/26/2012  Admission Diagnoses:  Failed total left knee replacement  Discharge Diagnoses:  Principal Problem:  *Failed total left knee replacement   Past Medical History  Diagnosis Date  . Hypertension   . H/O: rheumatic fever     age 37  . Dysrhythmia     saw cardiologist 2006 for rt bundle branch block - has not seen since  . Restless leg syndrome   . Arthritis   . RSD (reflex sympathetic dystrophy)     L hand  . GERD (gastroesophageal reflux disease)   . History of ulcer disease 1970's  . Chronic kidney disease     recent UTI / HX bladder cancer  . Cancer     bladder cancer  . Hearing loss   . Incontinence of urine   . 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 male who was admitted 04/23/2012 with a diagnosis of Failed total left knee replacement and went to the operating room on 04/23/2012 and underwent the above named procedures.    He was given perioperative antibiotics:  Anti-infectives     Start     Dose/Rate Route Frequency Ordered Stop   04/23/12 1930   vancomycin (VANCOCIN) IVPB 1000 mg/200 mL premix        1,000 mg 200 mL/hr over 60 Minutes Intravenous Every 12 hours 04/23/12 1505 04/23/12 2126   04/23/12 0551   vancomycin (VANCOCIN) 1,500 mg in sodium chloride 0.9 % 500 mL IVPB        1,500 mg 250 mL/hr over 120 Minutes Intravenous 120 min pre-op 04/23/12 0551 04/23/12 0730        .  He was given sequential compression devices, early ambulation, and lovenox bridging to coumadin for DVT prophylaxis.  He benefited maximally from the hospital stay and there were no complications.    Recent vital signs:  Filed Vitals:   04/26/12 0613  BP:  173/81  Pulse: 103  Temp: 98.3 F (36.8 C)  Resp: 20    Recent laboratory studies:  Lab Results  Component Value Date   HGB 11.3* 04/26/2012   HGB 11.1* 04/25/2012   HGB 12.1* 04/24/2012   Lab Results  Component Value Date   WBC 10.2 04/26/2012   PLT 269 04/26/2012   Lab Results  Component Value Date   INR 1.19 04/26/2012   Lab Results  Component Value Date   NA 132* 04/24/2012   K 4.5 04/24/2012   CL 99 04/24/2012   CO2 27 04/24/2012   BUN 16 04/24/2012   CREATININE 0.72 04/24/2012   GLUCOSE 130* 04/24/2012    Discharge Medications:     Medication List     As of 04/26/2012  6:59 AM    TAKE these medications         ALPRAZolam 0.5 MG tablet   Commonly known as: XANAX   Take 0.5 mg by mouth 2 (two) times daily.      DEXILANT 60 MG capsule   Generic drug: dexlansoprazole   Take 60 mg by mouth every evening.      diltiazem 360 MG 24 hr capsule   Commonly known as: TIAZAC   Take 360 mg by mouth daily before breakfast.  HYOPHEN PO   Take 1 tablet by mouth 4 (four) times daily.      imipramine 25 MG tablet   Commonly known as: TOFRANIL   Take 25 mg by mouth at bedtime.      methocarbamol 500 MG tablet   Commonly known as: ROBAXIN   Take 1 tablet (500 mg total) by mouth 4 (four) times daily.      multivitamin with minerals Tabs   Take 1 tablet by mouth daily.      nitrofurantoin 100 MG capsule   Commonly known as: MACRODANTIN   Take 100 mg by mouth at bedtime.      OVER THE COUNTER MEDICATION   Take 1 capsule by mouth daily. Sustenex probiotic      oxyCODONE-acetaminophen 10-325 MG per tablet   Commonly known as: PERCOCET   Take 1-2 tablets by mouth every 6 (six) hours as needed for pain. MAXIMUM TOTAL ACETAMINOPHEN DOSE IS 4000 MG PER DAY      pramipexole 0.5 MG tablet   Commonly known as: MIRAPEX   Take 1-1.5 mg by mouth 2 (two) times daily. Takes 2 tablets in the morning and 3 tablets at night      promethazine 25 MG tablet   Commonly known as:  PHENERGAN   Take 1 tablet (25 mg total) by mouth every 6 (six) hours as needed for nausea.      quinapril 40 MG tablet   Commonly known as: ACCUPRIL   Take 40 mg by mouth every morning.      SLOW FE 142 (45 FE) MG Tbcr   Generic drug: Ferrous Sulfate   Take 1 tablet by mouth every morning.      traMADol 50 MG tablet   Commonly known as: ULTRAM   Take 50 mg by mouth every 8 (eight) hours as needed. For pain      warfarin 5 MG tablet   Commonly known as: COUMADIN   Take 1 tablet (5 mg total) by mouth daily.      zolpidem 10 MG tablet   Commonly known as: AMBIEN   Take 10 mg by mouth at bedtime as needed. Sleep  \        Diagnostic Studies: Dg Chest 2 View  04/19/2012  *RADIOLOGY REPORT*  Clinical Data: Preoperative evaluation.  Nonsmoker.  No current chest complaints  CHEST - 2 VIEW  Comparison: None.  Findings: Heart and mediastinal contours are within normal limits. The lung fields demonstrate mild prominence of the interstitial markings compatible with mild underlying bronchitic change.  No focal infiltrates or signs of congestive failure are evident.  No pleural fluid or significant peribronchial cuffing is seen.  Bony structures demonstrate mild degenerative change in the thoracic spine and are otherwise intact.  IMPRESSION: Mild prominence of the interstitial markings suggesting underlying mild bronchitic change.  Otherwise negative   Original Report Authenticated By: Bertha Stakes, M.D.    X-ray Knee Left Port  04/23/2012  *RADIOLOGY REPORT*  Clinical Data: Postop  PORTABLE LEFT KNEE - 1-2 VIEW  Comparison: None.  Findings: Revision total knee arthroplasty is in place.  Anatomic alignment of the osseous and prosthetic structures.  Gas and fluid are present in the knee joint.  No breakage or loosening of the hardware.  IMPRESSION: Revision total knee arthroplasty anatomically aligned.   Original Report Authenticated By: Donavan Burnet, M.D.     Disposition: Skilled nursing  facility      Discharge Orders    Future Appointments:  Provider: Department: Dept Phone: Center:   07/24/2012 1:30 PM Shelia Media, MD [facility] [phone] None     Future Orders Please Complete By Expires   Diet general      Call MD / Call 911      Comments:   If you experience chest pain or shortness of breath, CALL 911 and be transported to the hospital emergency room.  If you develope a fever above 101 F, pus (white drainage) or increased drainage or redness at the wound, or calf pain, call your surgeon's office.   Discharge instructions      Comments:   Change dressing in 3 days and reapply fresh dressing, unless you have a splint (half cast).  If you have a splint/cast, just leave in place until your follow-up appointment.    Keep wounds dry for 3 weeks.  Leave steri-strips in place on skin.  Do not apply lotion or anything to the wound.   Constipation Prevention      Comments:   Drink plenty of fluids.  Prune juice may be helpful.  You may use a stool softener, such as Colace (over the counter) 100 mg twice a day.  Use MiraLax (over the counter) for constipation as needed.   TED hose      Comments:   Use stockings (TED hose) for 2 weeks on both leg(s).  You may remove them at night for sleeping.   Change dressing      Comments:   Change dressing in three days, then change the dressing daily with sterile 4 x 4 inch gauze dressing.  You may clean the incision with alcohol prior to redressing.   Do not put a pillow under the knee. Place it under the heel.         Follow-up Information    Follow up with Kody Vigil P, MD in 2 weeks.   Contact information:   [facility] [address], [address] [place] [address] [phone]           Signed: Eulas Post 04/26/2012, 6:59 AM

## 2012-04-26 NOTE — Progress Notes (Signed)
Clinical Social Work Department CLINICAL SOCIAL WORK PLACEMENT NOTE 04/26/2012  Patient:  Garrett Ferguson, Garrett Ferguson  Account Number:  [id number] Admit date:  04/23/2012  Clinical Social Worker:  Cori Razor, LCSW  Date/time:  04/24/2012 04:21 PM  Clinical Social Work is seeking post-discharge placement for this patient at the following level of care:   SKILLED NURSING   (*CSW will update this form in Epic as items are completed)   04/24/2012  Patient/family provided with [facility] Department of Clinical Social Work's list of facilities offering this level of care within the geographic area requested by the patient (or if unable, by the patient's family).  04/24/2012  Patient/family informed of their freedom to choose among providers that offer the needed level of care, that participate in Medicare, Medicaid or managed care program needed by the patient, have an available bed and are willing to accept the patient.    Patient/family informed of [facility]' ownership interest in [facility], as well as of the fact that they are under no obligation to receive care at this facility.  PASARR submitted to [employer] on 04/24/2012 PASARR number received from [employer] on 04/24/2012  FL2 transmitted to all facilities in geographic area requested by pt/family on  04/24/2012 FL2 transmitted to all facilities within larger geographic area on   Patient informed that his/her managed care company has contracts with or will negotiate with  certain facilities, including the following:     Patient/family informed of bed offers received:  04/24/2012 Patient chooses bed at [facility] Physician recommends and patient chooses bed at    Patient to be transferred to [facility] on  04/26/2012 Patient to be transferred to facility by [facility]  The following physician request were entered in Epic:   Additional Comments:  [employer] provided prior approval for SNF placement.  Cori Razor LCSW [phone]

## 2012-04-26 NOTE — Progress Notes (Signed)
Discharge summary sent to payer through [employer]  

## 2012-04-26 NOTE — Progress Notes (Signed)
     Subjective:  Patient reports pain as moderate.  Making slow progress with physical therapy.  Objective:   Neurologically intact Dorsiflexion/Plantar flexion intact Compartment soft   Assessment/Plan: POD 2  Principal Problem:  *Failed total left knee replacement   Advance diet Up with therapy Discharge to SNF   Garrett Ferguson 04/25/2012, 7:01 AM   Teryl Lucy, MD Cell [phone] Pager [phone]

## 2012-04-26 NOTE — Progress Notes (Signed)
Physical Therapy Treatment Patient Details Name: Issa Kosmicki MRN: [id number] DOB: 01-06-1946 Today's Date: 04/26/2012 Time: 4098-1191 PT Time Calculation (min): 10 min  PT Assessment / Plan / Recommendation Comments on Treatment Session  POD #3 am session.  Pt dressed and ready to go to Rehab.  Amb pt in hallway and assited on EMS stretcher.    Follow Up Recommendations  Skilled nursing facility    Barriers to Discharge        Equipment Recommendations  Defer to next venue    Recommendations for Other Services    Frequency 7X/week   Plan Discharge plan remains appropriate    Precautions / Restrictions Precautions Precautions: Knee Precaution Comments: Pt instructed on KI use for amb  Required Braces or Orthoses: Knee Immobilizer - Left Knee Immobilizer - Left: Discontinue once straight leg raise with < 10 degree lag Restrictions Weight Bearing Restrictions: No LLE Weight Bearing: Weight bearing as tolerated   Pertinent Vitals/Pain C/o "soreness"    Mobility  Bed Mobility Bed Mobility: Not assessed Details for Bed Mobility Assistance: Pt OOB in recliner  Transfers Transfers: Sit to Stand;Stand to Sit Sit to Stand: 4: Min guard;4: Min assist;From chair/3-in-1 Stand to Sit: 4: Min assist;Other (comment) Tax inspector) Details for Transfer Assistance: 25% VC's on proper tech and increased time   Ambulation/Gait Ambulation/Gait Assistance: 4: Min guard;4: Min Environmental consultant (Feet): 120 Feet Assistive device: Rolling walker Ambulation/Gait Assistance Details: <25% VC's on safety with turns and increased time Gait Pattern: Step-to pattern;Decreased stance time - left;Decreased stride length;Trunk flexed Gait velocity: decreased      PT Goals                     progressing    Visit Information  Last PT Received On: 04/26/12 Assistance Needed: +1                   End of Session PT - End of Session Equipment Utilized During Treatment: Gait  belt;Left knee immobilizer Activity Tolerance: Patient tolerated treatment well Patient left: Other (comment) (EMS stretcher)   Felecia Shelling  PTA [facility]  Acute  Rehab Pager     [phone]

## 2012-04-28 LAB — ANAEROBIC CULTURE

## 2012-07-24 ENCOUNTER — Ambulatory Visit: Payer: Self-pay | Admitting: Internal Medicine

## 2013-01-31 ENCOUNTER — Other Ambulatory Visit: Payer: Self-pay

## 2013-01-31 MED ORDER — PRAMIPEXOLE DIHYDROCHLORIDE 0.5 MG PO TABS: ORAL_TABLET | ORAL | Status: DC

## 2013-04-22 IMAGING — CR DG CHEST 2V
2 series · 2 of 2 positions shown · non-contrast
Comparison: None.

CLINICAL DATA: Preoperative evaluation.  Nonsmoker.  No current
chest complaints

CHEST - 2 VIEW

[w chest pa]
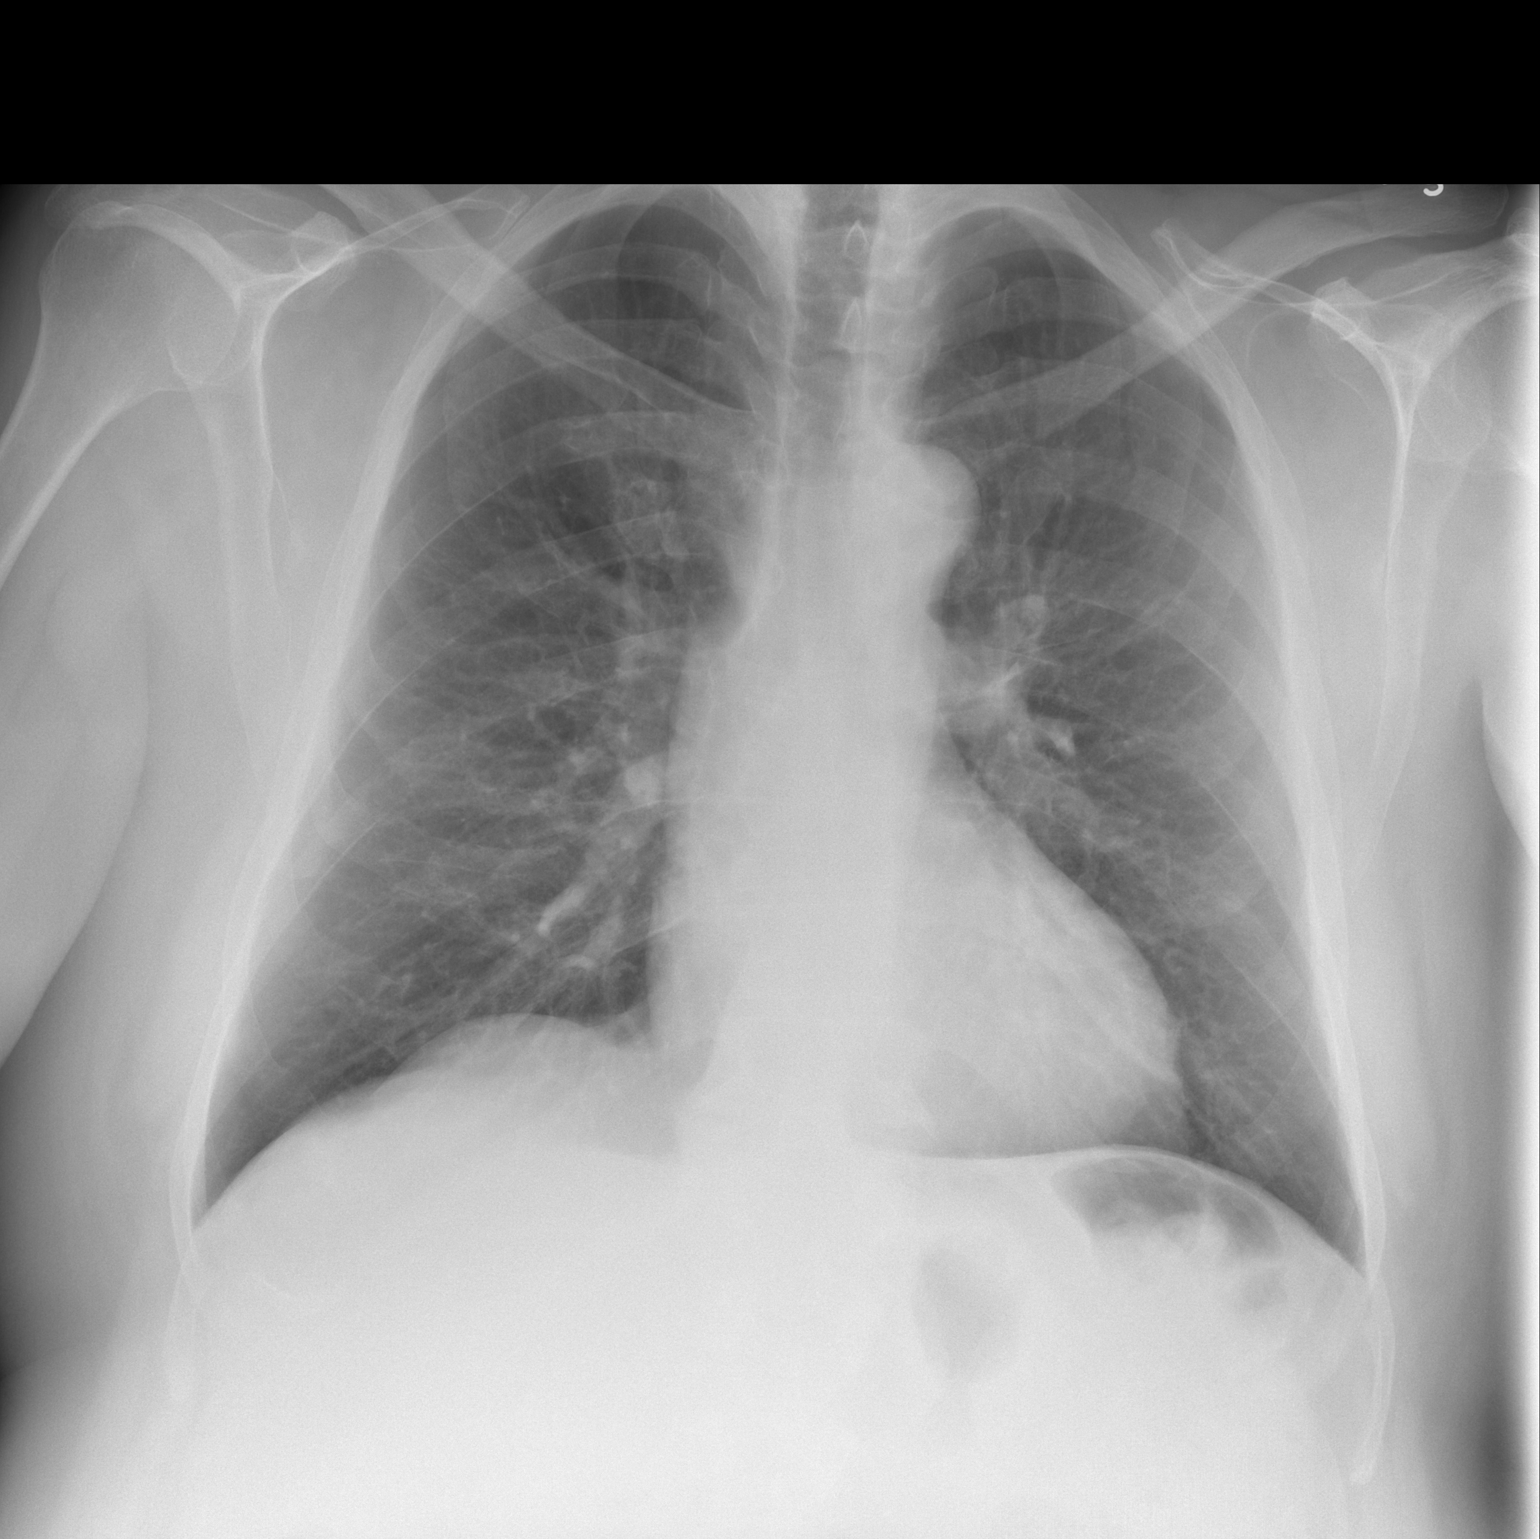

[w chest lat]
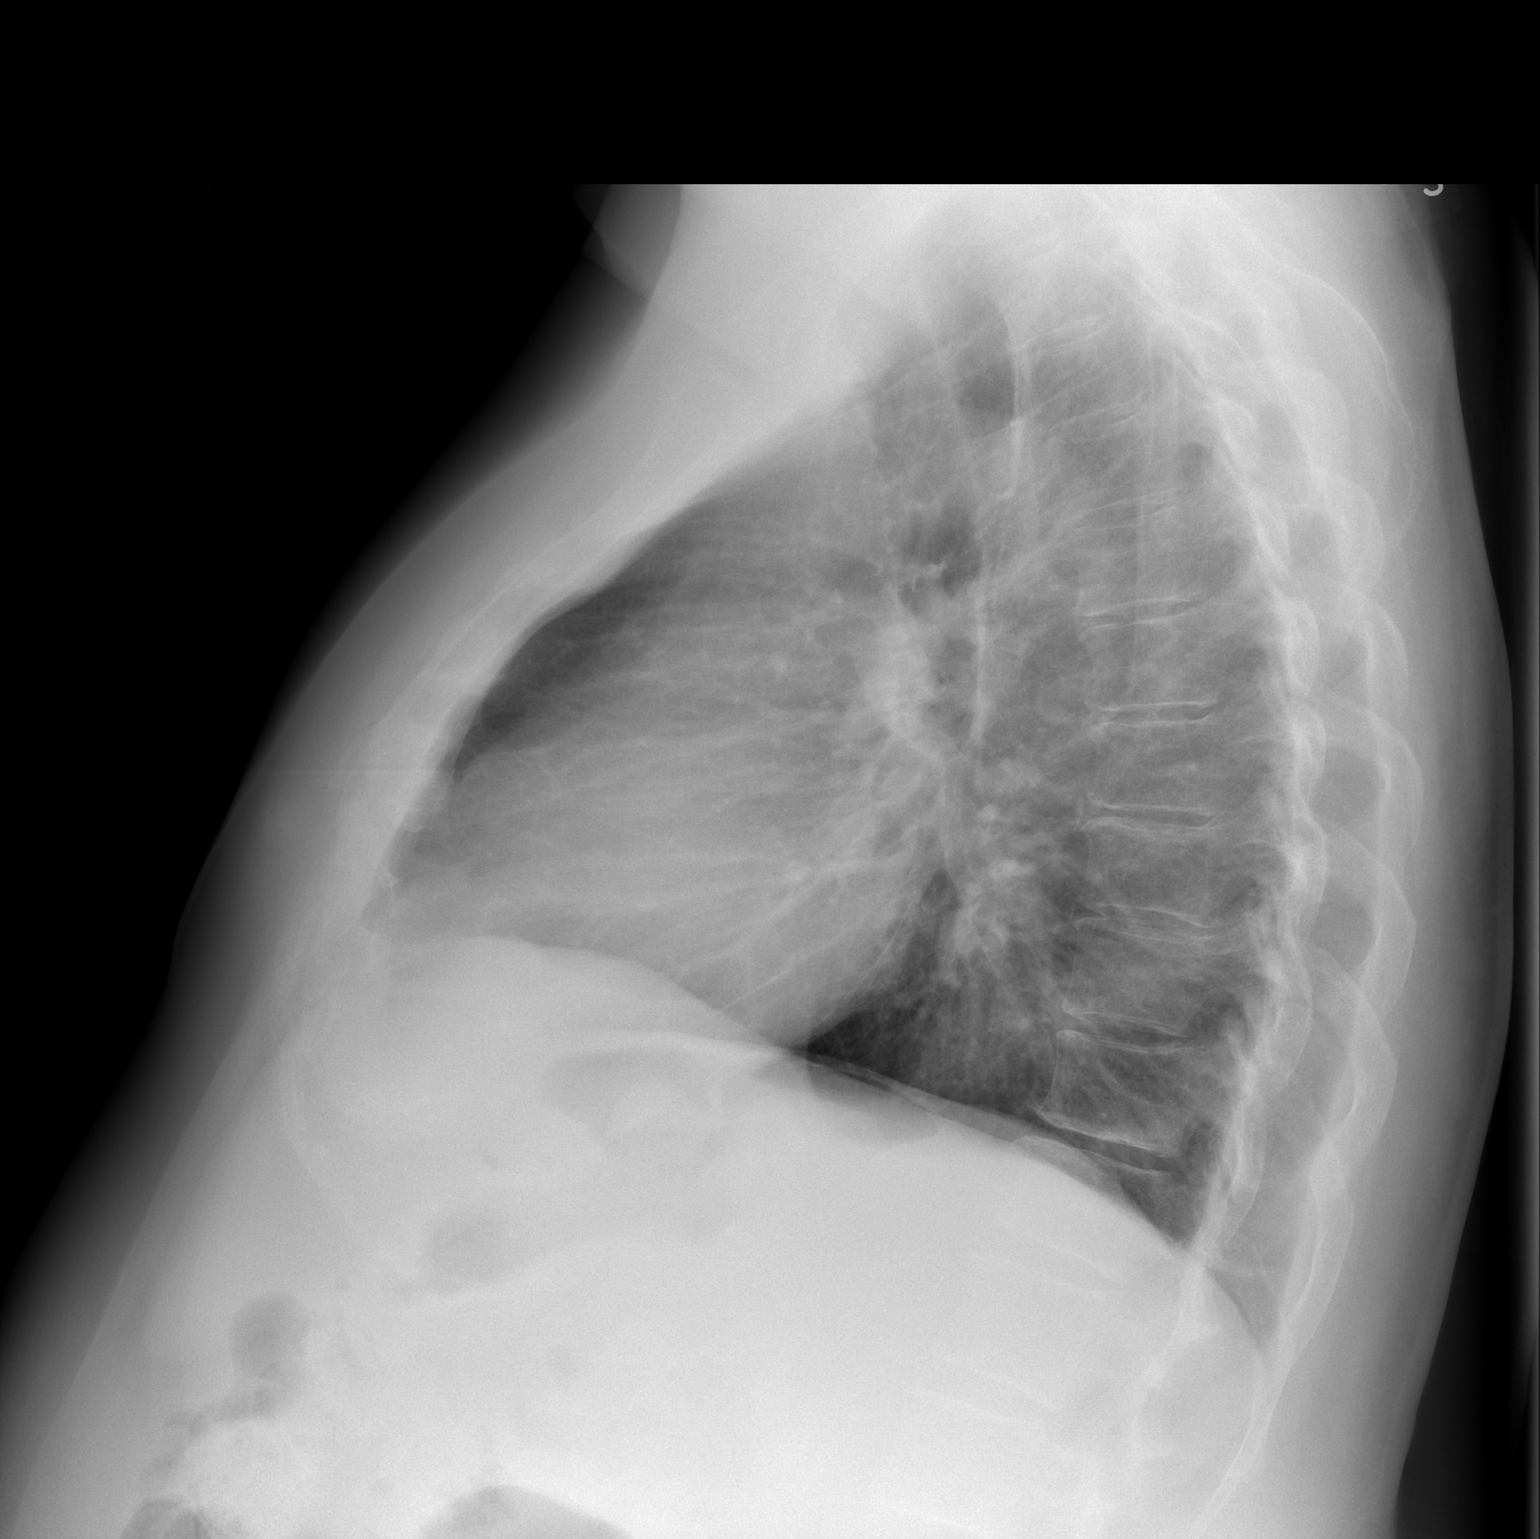

[2 of 2 positions shown; findings below may reference images not displayed]

FINDINGS: Heart and mediastinal contours are within normal limits.
The lung fields demonstrate mild prominence of the interstitial
markings compatible with mild underlying bronchitic change.  No
focal infiltrates or signs of congestive failure are evident.  No
pleural fluid or significant peribronchial cuffing is seen.

Bony structures demonstrate mild degenerative change in the
thoracic spine and are otherwise intact.
IMPRESSION: Mild prominence of the interstitial markings suggesting underlying
mild bronchitic change.  Otherwise negative

## 2013-04-26 IMAGING — CR DG KNEE 1-2V PORT*L*
1 series · 2 of 2 positions shown · non-contrast
Comparison: None.

CLINICAL DATA: Postop

PORTABLE LEFT KNEE - 1-2 VIEW

[Series 1: AP · left · 2 of 2 slices shown]
[im 1/2]
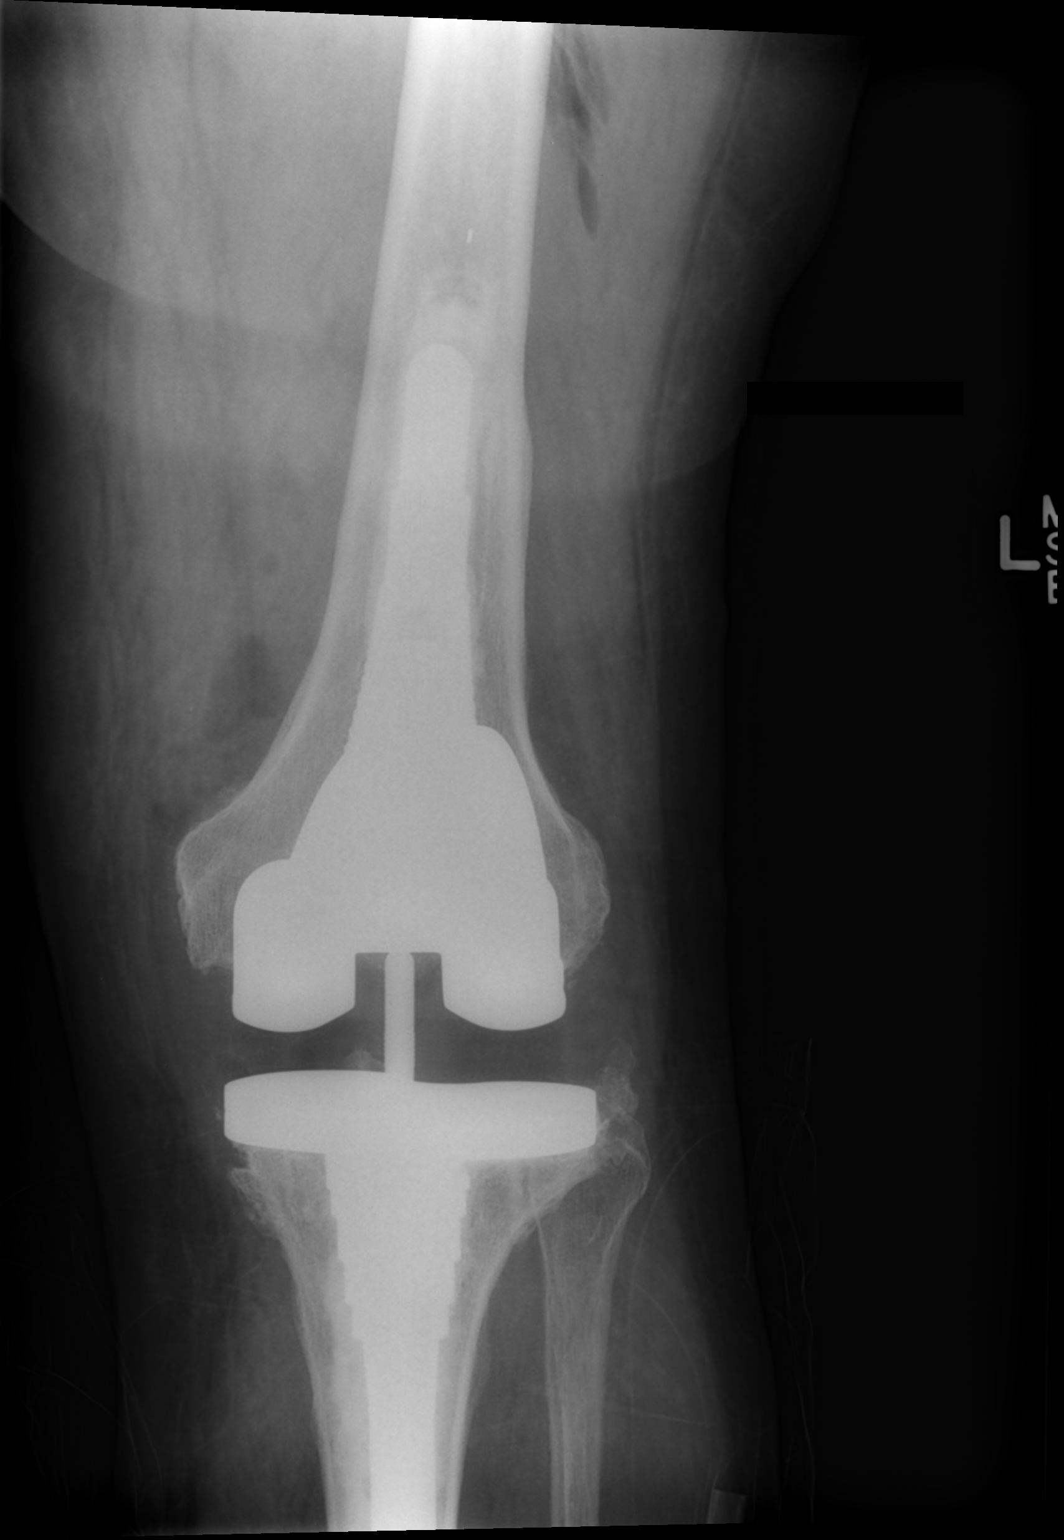
[im 2/2]
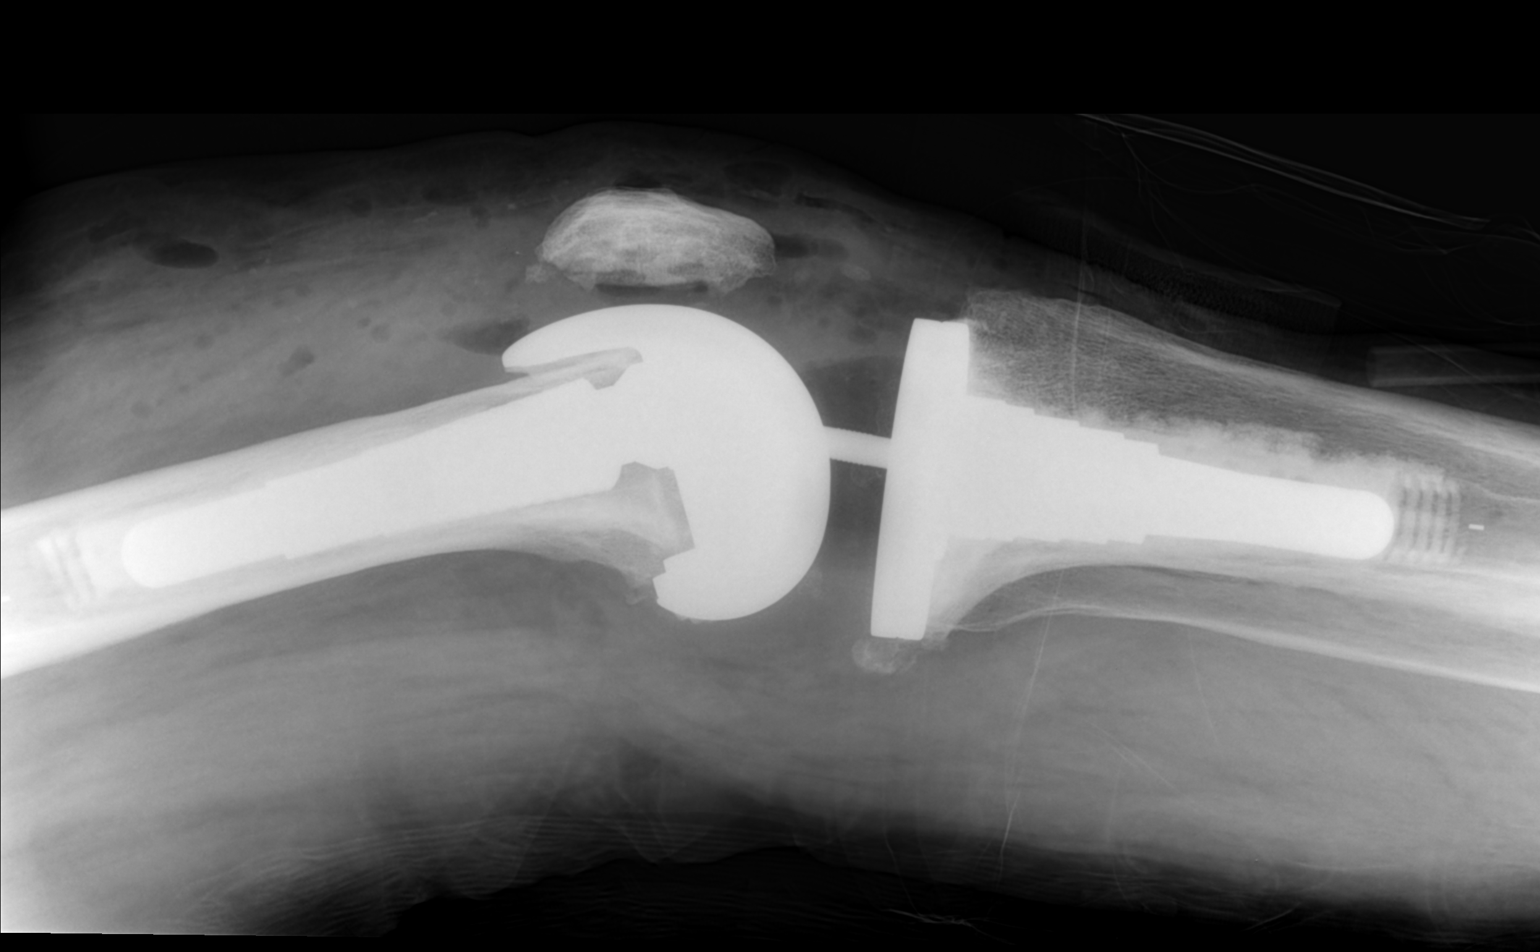

[2 of 2 positions shown; findings below may reference images not displayed]

FINDINGS: Revision total knee arthroplasty is in place.  Anatomic
alignment of the osseous and prosthetic structures.  Gas and fluid
are present in the knee joint.  No breakage or loosening of the
hardware.
IMPRESSION: Revision total knee arthroplasty anatomically aligned.

## 2013-05-09 ENCOUNTER — Other Ambulatory Visit: Payer: Self-pay

## 2013-05-09 MED ORDER — ALPRAZOLAM 0.5 MG PO TABS: 0.5000 mg | ORAL_TABLET | Freq: Two times a day (BID) | ORAL | Status: DC

## 2013-05-21 ENCOUNTER — Telehealth: Payer: Self-pay | Admitting: Neurology

## 2013-05-21 MED ORDER — GABAPENTIN 300 MG PO CAPS: 300.0000 mg | ORAL_CAPSULE | Freq: Every day | ORAL | Status: DC

## 2013-05-21 NOTE — Telephone Encounter (Signed)
I called the patient. The patient is still having increasing problems with restless leg syndrome on Mirapex taking 0.5 mg twice during the day, 1.5 mg at night. I will try adding gabapentin at night. The patient may also takes alprazolam at night which may help.

## 2013-05-21 NOTE — Telephone Encounter (Signed)
Patient left message that the Mirapex is not working very effectively for his restless leg syndrome.  [phone]

## 2013-07-08 ENCOUNTER — Encounter: Payer: Self-pay | Admitting: Nurse Practitioner

## 2013-07-16 ENCOUNTER — Ambulatory Visit: Payer: Self-pay | Admitting: Nurse Practitioner

## 2013-08-08 ENCOUNTER — Encounter (INDEPENDENT_AMBULATORY_CARE_PROVIDER_SITE_OTHER): Payer: Self-pay

## 2013-08-08 ENCOUNTER — Encounter: Payer: Self-pay | Admitting: Nurse Practitioner

## 2013-08-08 ENCOUNTER — Ambulatory Visit (INDEPENDENT_AMBULATORY_CARE_PROVIDER_SITE_OTHER): Payer: Self-pay | Admitting: Nurse Practitioner

## 2013-08-08 VITALS — BP 148/82 | HR 72 | Ht 71.0 in | Wt 257.0 lb

## 2013-08-08 DIAGNOSIS — G2581 Restless legs syndrome: Secondary | ICD-10-CM | POA: Insufficient documentation

## 2013-08-08 HISTORY — DX: Restless legs syndrome: G25.81

## 2013-08-08 MED ORDER — GABAPENTIN 300 MG PO CAPS: 300.0000 mg | ORAL_CAPSULE | Freq: Every day | ORAL | Status: DC

## 2013-08-08 MED ORDER — PRAMIPEXOLE DIHYDROCHLORIDE 0.5 MG PO TABS: ORAL_TABLET | ORAL | Status: DC

## 2013-08-08 MED ORDER — ALPRAZOLAM 0.5 MG PO TABS: 0.5000 mg | ORAL_TABLET | Freq: Two times a day (BID) | ORAL | Status: DC | PRN

## 2013-08-08 NOTE — Patient Instructions (Signed)
Will increase Mirapex by 1 tab to 1 with meals and 3 hs Resume iron daily F/U yearly

## 2013-08-08 NOTE — Progress Notes (Signed)
[facility]  PATIENT: Garrett Ferguson DOB: May 07, 1946   REASON FOR VISIT: Followup for restless legs   HISTORY OF PRESENT ILLNESS: Mr. Therien, 67 year old male returns for followup. He has a history of restless legs and is currently on Mirapex and Xanax. Gabapentin was added in October. He recently had an additional knee surgery which was unsuccessful according to the patient. He has swelling of that left  Knee and leg. He ambulates with a cane some days. He felt Mirapex works better than gabapentin. He recently stopped taking his iron medication several months ago.He does not have a drug benefit with his Medicare only part A.    HISTORY: of restless leg syndrome. The patient also has a history of trigeminal neuralgia, but this has not been an issue for him recently. The patient has generally done well with the restless leg syndrome taking Mirapex. The patient however, has had a lot of issues with his left knee, with recent surgery for a total knee replacement. The patient has had a lot of ongoing discomfort, and he is walking with a cane. The patient returns for an evaluation. The patient is sleeping relatively well on the Mirapex.   REVIEW OF SYSTEMS: Full 14 system review of systems performed and notable only for those listed, all others are neg:  Constitutional: N/A  Cardiovascular: N/A  Ear/Nose/Throat: N/A  Skin: N/A  Eyes: N/A  Respiratory: N/A  Gastroitestinal: Urinary frequency  Hematology/Lymphatic: N/A  Endocrine: N/A Musculoskeletal: Joint pain, joint swelling  Allergy/Immunology: N/A  Neurological: N/A Psychiatric: N/A   ALLERGIES: Allergies  Allergen Reactions  . Penicillins Rash    HOME MEDICATIONS: Outpatient Prescriptions Prior to Visit  Medication Sig Dispense Refill  . ALPRAZolam (XANAX) 0.5 MG tablet Take 1 tablet (0.5 mg total) by mouth 2 (two) times daily. If needed  60 tablet  5  . dexlansoprazole (DEXILANT) 60 MG capsule Take 60 mg  by mouth every evening.       . diltiazem (TIAZAC) 360 MG 24 hr capsule Take 360 mg by mouth daily before breakfast.       . gabapentin (NEURONTIN) 300 MG capsule Take 1 capsule (300 mg total) by mouth at bedtime.  30 capsule  3  . Multiple Vitamin (MULTIVITAMIN WITH MINERALS) TABS Take 1 tablet by mouth daily.      . pramipexole (MIRAPEX) 0.5 MG tablet One twice daily and 3 tablets at night  150 tablet  5  . quinapril (ACCUPRIL) 40 MG tablet Take 40 mg by mouth every morning.      . traMADol (ULTRAM) 50 MG tablet Take 50 mg by mouth every 8 (eight) hours as needed. For pain      . zolpidem (AMBIEN) 10 MG tablet Take 10 mg by mouth at bedtime as needed. Sleep \      . enoxaparin (LOVENOX) 40 MG/0.4ML injection Inject 0.4 mLs (40 mg total) into the skin daily.  5 Syringe  0  . Ferrous Sulfate (SLOW FE) 142 (45 FE) MG TBCR Take 1 tablet by mouth every morning.      Marland Kitchen imipramine (TOFRANIL) 25 MG tablet Take 25 mg by mouth at bedtime.      . Meth-Hyo-M Bl-Benz Acd-Ph Sal (HYOPHEN PO) Take 1 tablet by mouth 4 (four) times daily.      . nitrofurantoin (MACRODANTIN) 100 MG capsule Take 100 mg by mouth at bedtime.      Marland Kitchen OVER THE COUNTER MEDICATION Take 1 capsule by mouth daily. Sustenex probiotic      .  promethazine (PHENERGAN) 25 MG tablet Take 1 tablet (25 mg total) by mouth every 6 (six) hours as needed for nausea.  30 tablet  0  . warfarin (COUMADIN) 5 MG tablet Take 1 tablet (5 mg total) by mouth daily.  30 tablet  1   No facility-administered medications prior to visit.    PAST MEDICAL HISTORY: Past Medical History  Diagnosis Date  . Hypertension   . H/O: rheumatic fever     age 48  . Dysrhythmia     saw cardiologist 2006 for rt bundle branch block - has not seen since  . Restless leg syndrome   . Arthritis   . RSD (reflex sympathetic dystrophy)     L hand  . GERD (gastroesophageal reflux disease)   . History of ulcer disease 1970's  . Chronic kidney disease     recent UTI / HX  bladder cancer  . Cancer     bladder cancer  . Hearing loss   . Incontinence of urine   . 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 Joint replacement  replacements x3    l knee with multiple surg on same knee  . Cholecystectomy  1979  . Nasal sinus surgery  1990's  . Foot surgery      x5  . Transurethral resection of prostate    . Prostate biopsy    . Carpall tunnel      L hand  . Total knee revision  04/23/2012    Procedure: TOTAL KNEE REVISION;  Surgeon: Eulas Post, MD;  Location: [facility];  Service: Orthopedics;  Laterality: Left;  . Bladder surgery      transitional cell carcinoma    FAMILY HISTORY: Family History  Problem Relation Age of Onset  . Cancer    . Melanoma    . High blood pressure      SOCIAL HISTORY: History   Social History  . Marital Status: Married    Spouse Name: Kathie Rhodes    Number of Children: 1  . Years of Education: college   Occupational History  .     Social History Main Topics  . Smoking status: Never Smoker   . Smokeless tobacco: Never Used  . Alcohol Use: No  . Drug Use: No  . Sexual Activity: Not on file   Other Topics Concern  . Not on file   Social History Narrative   Patient is married and lives with his wife([name]).   Patient has a degree from a divinity school.   Patient is a retired Optician, dispensing.   Patient has one child.   Patient drinks some tea and diet soda occasionally.     PHYSICAL EXAM  Filed Vitals:   08/08/13 0937  BP: 148/82  Pulse: 72  Height: 5\' 11"  (1.803 m)  Weight: 257 lb (116.574 kg)   Body mass index is 35.86 kg/(m^2).  Generalized: Well developed, in no acute distress  Skin: Mild peripheral edema noted Neurological examination   Mentation: Alert oriented to time, place, history taking. Follows all commands speech and language fluent  Cranial nerve II-XII: Pupils were equal round reactive to light extraocular  movements were full, visual field were full on confrontational test. Facial sensation and strength were normal. hearing was intact to finger rubbing bilaterally. Uvula tongue midline. head turning and shoulder shrug were normal and symmetric.Tongue protrusion into cheek strength was normal. Motor: normal bulk and tone, full  strength in the BUE, BLE, fine finger movements normal, no pronator drift. No focal weakness Coordination: finger-nose-finger, heel-to-shin bilaterally, no dysmetria Reflexes: Brachioradialis 2/2, biceps 2/2, triceps 2/2, patellar 2/2 on the right absent on the left, Achilles 2/2, plantar responses were flexor bilaterally. Gait and Station: Rising up from seated position without assistance, normal stance,  moderate stride, good arm swing, smooth turning, able to perform tiptoe, and heel walking without difficulty. Tandem gait is unsteady  DIAGNOSTIC DATA (LABS, IMAGING, TESTING) -None to review    ASSESSMENT AND PLAN  67 y.o. year old male  has a past medical history of Hypertension; H/O: rheumatic fever; Dysrhythmia; Restless leg syndrome; Arthritis; RSD (reflex sympathetic dystrophy); GERD (gastroesophageal reflux disease); History of ulcer disease (1970's); Chronic kidney disease; Cancer; Hearing loss; Incontinence of urine; and Failed total left knee replacement (04/23/2012). here to followup for restless legs. He had another recent failed knee replacement. He was recently placed on gabapentin by Dr. Anne Hahn and he's not sure that has been beneficial. He does have some peripheral edema of lower extremities.  Will increase Mirapex by 1 tab to 1 with meals and 3 hs May need to add Neupro however patient does not have insurance for meds Resume iron daily Continue Xanax and Gabapentin Call for further issues F/U yearly Nilda Riggs, [facility], [facility], APRN  [facility] [address] [place], [place] [address] [phone]

## 2013-08-08 NOTE — Progress Notes (Signed)
I have read the note, and I agree with the clinical assessment and plan.  Garrett Ferguson,Garrett Ferguson   

## 2014-03-11 ENCOUNTER — Other Ambulatory Visit: Payer: Self-pay | Admitting: Nurse Practitioner

## 2014-03-12 ENCOUNTER — Other Ambulatory Visit: Payer: Self-pay

## 2014-03-12 MED ORDER — ALPRAZOLAM 0.5 MG PO TABS: 0.5000 mg | ORAL_TABLET | Freq: Two times a day (BID) | ORAL | Status: DC | PRN

## 2014-03-12 NOTE — Telephone Encounter (Signed)
Rx has been faxed.

## 2014-08-10 ENCOUNTER — Encounter: Payer: Self-pay | Admitting: Neurology

## 2014-08-10 ENCOUNTER — Ambulatory Visit (INDEPENDENT_AMBULATORY_CARE_PROVIDER_SITE_OTHER): Payer: Medicare Other | Admitting: Neurology

## 2014-08-10 VITALS — BP 162/84 | HR 68 | Ht 71.0 in | Wt 255.2 lb

## 2014-08-10 DIAGNOSIS — G2581 Restless legs syndrome: Secondary | ICD-10-CM

## 2014-08-10 DIAGNOSIS — D500 Iron deficiency anemia secondary to blood loss (chronic): Secondary | ICD-10-CM

## 2014-08-10 MED ORDER — GABAPENTIN 300 MG PO CAPS: 600.0000 mg | ORAL_CAPSULE | Freq: Every day | ORAL | Status: DC

## 2014-08-10 NOTE — Progress Notes (Signed)
Reason for visit: Restless leg syndrome  Garrett Ferguson is an 68 y.o. male  History of present illness:  Garrett Ferguson is a 68 year old right-handed white male with a history of severe restless leg syndrome. The patient indicates that this issue is resulting in many nights without much sleep. The patient has to get up and walk throughout the night. He indicates that both arms and the legs are affected. The restless legs syndrome symptoms are present throughout the day. He is on Mirapex taking 0.5 mg 3 times a day, and 1.5 mg at night. He also takes gabapentin, only 300 mg at night. He also has Ultram and alprazolam to take. The patient denies any numbness in the legs. He is on Zoloft, but he denies this has worsened his restless leg syndrome.  Past Medical History  Diagnosis Date  . Hypertension   . H/O: rheumatic fever     age 46  . Dysrhythmia     saw cardiologist 2006 for rt bundle branch block - has not seen since  . Restless leg syndrome   . Arthritis   . RSD (reflex sympathetic dystrophy)     L hand  . GERD (gastroesophageal reflux disease)   . History of ulcer disease 1970's  . Chronic kidney disease     recent UTI / HX bladder cancer  . Cancer     bladder cancer  . Hearing loss   . Incontinence of urine   . 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 Joint replacement  replacements x3    l knee with multiple surg on same knee  . Cholecystectomy  1979  . Nasal sinus surgery  1990's  . Foot surgery      x5  . Transurethral resection of prostate    . Prostate biopsy    . Carpall tunnel      L hand  . Total knee revision  04/23/2012    Procedure: TOTAL KNEE REVISION;  Surgeon: Johnny Bridge, MD;  Location: [facility];  Service: Orthopedics;  Laterality: Left;  . Bladder surgery      transitional cell carcinoma    Family History  Problem Relation Age of Onset  . Cancer    . Melanoma    . High blood  pressure    . Cancer Father   . Melanoma Father   . Heart disease Sister     Social history:  reports that he has never smoked. He has never used smokeless tobacco. He reports that he does not drink alcohol or use illicit drugs.    Allergies  Allergen Reactions  . Penicillins Rash    Medications:  Current Outpatient Prescriptions on File Prior to Visit  Medication Sig Dispense Refill  . ALPRAZolam (XANAX) 0.5 MG tablet Take 1 tablet (0.5 mg total) by mouth 2 (two) times daily as needed for anxiety. If needed 60 tablet 5  . Multiple Vitamin (MULTIVITAMIN WITH MINERALS) TABS Take 1 tablet by mouth daily.    . pramipexole (MIRAPEX) 0.5 MG tablet One with meals  and 3 tablets at night 180 tablet 11  . quinapril (ACCUPRIL) 40 MG tablet Take 40 mg by mouth every morning.    . zolpidem (AMBIEN) 10 MG tablet Take 10 mg by mouth at bedtime as needed. Sleep \     No current facility-administered medications on file prior to visit.    ROS:  Out of a complete  14 system review of symptoms, the patient complains only of the following symptoms, and all other reviewed systems are negative.  Eye itching, eye redness Frequency of urination Joint pain, walking difficulty Depression  Blood pressure 162/84, pulse 68, height 5\' 11"  (1.803 m), weight 255 lb 3.2 oz (115.758 kg).  Physical Exam  General: The patient is alert and cooperative at the time of the examination. The patient is moderately to markedly obese.  Skin: 1-2+ edema below the knees is noted bilaterally.   Neurologic Exam  Mental status: The patient is oriented x 3.  Cranial nerves: Facial symmetry is present. Speech is normal, no aphasia or dysarthria is noted. Extraocular movements are full. Visual fields are full.  Motor: The patient has good strength in all 4 extremities.  Sensory examination: Soft touch sensation is symmetric on the face, arms, and legs.  Coordination: The patient has good finger-nose-finger and  heel-to-shin bilaterally.  Gait and station: The patient has a normal gait. Tandem gait is slightly unsteady. Romberg is negative. No drift is seen.  Reflexes: Deep tendon reflexes are symmetric, but are depressed.   Assessment/Plan:  1. Restless leg syndrome  The patient has significant issues with restless leg syndrome. I will check an iron panel and a ferritin level today. The patient will go up on the gabapentin taking 600 mg at night. He will continue the current dose of Mirapex, and he will follow-up in 6 months.  Jill Alexanders MD 08/10/2014 7:28 PM  [facility] [address] [place], [place] [address]  Phone [phone] Fax [phone]

## 2014-08-10 NOTE — Patient Instructions (Signed)
Restless Legs Syndrome Restless legs syndrome is a movement disorder. It may also be called a sensorimotor disorder.  CAUSES  No one knows what specifically causes restless legs syndrome, but it tends to run in families. It is also more common in people with low iron, in pregnancy, in people who need dialysis, and those with nerve damage (neuropathy).Some medications may make restless legs syndrome worse.Those medications include drugs to treat high blood pressure, some heart conditions, nausea, colds, allergies, and depression. SYMPTOMS Symptoms include uncomfortable sensations in the legs. These leg sensations are worse during periods of inactivity or rest. They are also worse while sitting or lying down. Individuals that have the disorder describe sensations in the legs that feel like:  Pulling.  Drawing.  Crawling.  Worming.  Boring.  Tingling.  Pins and needles.  Prickling.  Pain. The sensations are usually accompanied by an overwhelming urge to move the legs. Sudden muscle jerks may also occur. Movement provides temporary relief from the discomfort. In rare cases, the arms may also be affected. Symptoms may interfere with going to sleep (sleep onset insomnia). Restless legs syndrome may also be related to periodic limb movement disorder (PLMD). PLMD is another more common motor disorder. It also causes interrupted sleep. The symptoms from PLMD usually occur most often when you are awake. TREATMENT  Treatment for restless legs syndrome is symptomatic. This means that the symptoms are treated.   Massage and cold compresses may provide temporary relief.  Walk, stretch, or take a cold or hot bath.  Get regular exercise and a good night's sleep.  Avoid caffeine, alcohol, nicotine, and medications that can make it worse.  Do activities that provide mental stimulation like discussions, needlework, and video games. These may be helpful if you are not able to walk or stretch. Some  medications are effective in relieving the symptoms. However, many of these medications have side effects. Ask your caregiver about medications that may help your symptoms. Correcting iron deficiency may improve symptoms for some patients. Document Released: 07/21/2002 Document Revised: 12/15/2013 Document Reviewed: 10/27/2010 ExitCare Patient Information 2015 [employer]. This information is not intended to replace advice given to you by your health care provider. Make sure you discuss any questions you have with your health care provider.  

## 2014-08-11 LAB — IRON AND TIBC
Iron Saturation: 21 % (ref 15–55)
Iron: 75 ug/dL (ref 40–155)
TIBC: 349 ug/dL (ref 250–450)
UIBC: 274 ug/dL (ref 150–375)

## 2014-08-11 LAB — FERRITIN: FERRITIN: 66 ng/mL (ref 30–400)

## 2014-08-24 ENCOUNTER — Other Ambulatory Visit: Payer: Self-pay | Admitting: Nurse Practitioner

## 2014-09-11 ENCOUNTER — Other Ambulatory Visit: Payer: Self-pay | Admitting: Neurology

## 2014-09-18 ENCOUNTER — Other Ambulatory Visit: Payer: Self-pay

## 2014-09-18 MED ORDER — ALPRAZOLAM 0.5 MG PO TABS: 0.5000 mg | ORAL_TABLET | Freq: Two times a day (BID) | ORAL | Status: DC | PRN

## 2014-09-18 NOTE — Telephone Encounter (Signed)
Rx signed and faxed.

## 2014-10-29 ENCOUNTER — Other Ambulatory Visit: Payer: Self-pay | Admitting: Neurology

## 2014-10-29 NOTE — Telephone Encounter (Signed)
Duplicate Request.  6 refills were sent last month.

## 2014-12-01 NOTE — H&P (Signed)
PATIENT NAME:  Garrett Ferguson, Garrett Ferguson MR#:  [id number] DATE OF BIRTH:  [birth year]  DATE OF ADMISSION:  04/05/2012  PRIMARY CARE PHYSICIAN: Ronette Deter, MD   CHIEF COMPLAINT: High-grade fever, bilateral flank pain, and significant dysuria.   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 He recently had a urinary tract infection about five weeks ago treated with oral antibiotics as an outpatient, comes in today with significant back pain, more on the left flank than right, with high-grade fever of 101, tachycardia, and significant dysuria along with abdominal pain. In the Emergency Room, the patient is tachycardic and with fever of 101. He was found to have an elevated white count of 21,000. The patient appears "unwell" and is being admitted for further evaluation and management.   PAST MEDICAL/SURGICAL HISTORY:  1. History of C. difficile colitis.  2. Acid reflux.  3. Urinary bladder malakoplakia.  4. Hypertension.  5. Restless leg syndrome.  6. History of cholecystectomy.  7. Bladder biopsies with multiple cystoscopies in the past.  8. Bladder surgery for urinary bladder cancer.  9. Left knee repair along with replacement x4.  10. Hiatal hernia.  11. Basal cell skin cancer.   CURRENT MEDICATIONS:  1. Alprazolam 0.5 mg b.i.d.  2. Dexilant 60 mg at bedtime.  3. Feosol 325 mg, 1 p.o. daily.  4. Hyophen 1 tablet q.i.d.  5. Imipramine 25 mg at bedtime.  6. Multivitamin p.o. daily.  7. Nitrofurantoin 100 mg at bedtime.  8. Pramipexole 0.5 mg, 2 tablets b.i.d.  9. Quinapril 40 mg daily.  10. Taztia XT 360 mg 24-hour extended-release, 1 capsule daily in the morning.   ALLERGIES: Penicillin and tape.   FAMILY HISTORY: Positive for  hypertension.   SOCIAL HISTORY: He is a nonsmoker, nonalcoholic.   REVIEW OF SYSTEMS: CONSTITUTIONAL: Positive for fever, fatigue, weakness. EYES: No blurred or double vision. No glaucoma. ENT: No tinnitus, ear pain, hearing loss. RESPIRATORY: No cough, wheeze, or hemoptysis. CARDIOVASCULAR: No chest pain, no peripheral edema. GASTROINTESTINAL: No nausea, vomiting, diarrhea, abdominal pain. Positive for gastroesophageal reflux disease. GENITOURINARY: Positive for dysuria. No hematuria. Positive for frequency. ENDOCRINE: No polyuria or nocturia. HEMATOLOGY: No anemia or easy bruising. SKIN: No acne or rash. MUSCULOSKELETAL: Positive for arthritis. NEUROLOGIC: No cerebrovascular accident, transient ischemic attack. PSYCH: No anxiety or depression. All other systems reviewed and negative.   PHYSICAL EXAMINATION:  GENERAL: The patient is awake, alert, oriented x3.   VITAL SIGNS: Temperature is 101.3, pulse is 100, blood pressure is 160/90, saturations are 97% on room air.   HEENT: Atraumatic, normocephalic. Pupils are equal, round, and reactive to light and accommodation. Extraocular movements are intact. Oral mucosa is moist.   NECK: Supple. No JVD. No carotid bruit.   LUNGS: Clear to auscultation bilaterally. No rales, rhonchi, respiratory distress, or labored breathing.   CARDIOVASCULAR: Both the heart sounds are normal. Rate is tachycardic. Rhythm is regular. No murmur heard. PMI not lateralized.   CHEST: Nontender.   EXTREMITIES: Good pedal pulses, good femoral pulses.  No lower extremity edema.   ABDOMEN: Soft, benign, and nontender. No organomegaly. Positive bowel sounds.   NEUROLOGICAL:   Grossly intact cranial nerves II through XII.  No motor or sensory deficits.   PSYCHIATRIC: The patient is awake, alert, oriented x3.   LABORATORY, DIAGNOSTIC AND RADIOLOGICAL  DATA: White count is 21,000, hemoglobin and hematocrit is 15.2 and 46.6. Comprehensive metabolic panel within normal limits  except sodium of 135 and a total protein of 8.3. Chest x-ray shows mild interstitial edema which may be of cardiac or noncardiac cause. Urinalysis positive for urinary tract infection.   ASSESSMENT: Garrett Ferguson is a 69 year old with history of hypertension, gastroesophageal reflux disease, history of bladder cancer in the past, comes in with:   1. Systemic inflammatory response syndrome: Suspected from acute pyelonephritis. The patient is being admitted, came in with a high-grade fever of 101.3, with tachycardia and elevated white count. He has got an abnormal urinalysis with positive leukocyte esterase and plenty of WBCs. The patient has history of recurrent urinary tract infections in the past, recently completed a course of oral antibiotics about five weeks ago. He is going to be admitted to a Medical floor, continue IV fluids. We will start the patient on IV Levaquin, follow blood cultures, urine cultures. I have left a message for Dr. Jacqlyn Larsen to call me to get a previous urine culture report in order to verify the organism and the sensitivities to the culture reports in the past. We will  continue Hyophen, which is Uribel, while in the hospital.  2. Acute pyelonephritis: The patient came in with complaints of left flank pain more than right, significant dysuria and abnormal urinalysis with positive with elevated white cell count with leukocytosis. The patient has history of urinary bladder malakoplakia  and has a tendency to have frequent urinary tract infections. He is currently on nitrofurantoin for chronic urinary tract infection suppression. We will continue IV antibiotics for now, Follow blood cultures, urine cultures and adjust antibiotics accordingly. Consider Urology consult with Dr. Jacqlyn Larsen, who is the patient's primary urologist, if needed.  3. Hypertension: Continue Taztia, which is Cardizem.  4. Restless leg syndrome: We will continue on iron pills and Mirapex.  5. Gastroesophageal reflux disease:  We will continue Protonix. At discharge, the patient can resume his Dexilant at home.  6. Further work-up according to the patient's clinical course.   The hospital admission plan was discussed with the patient. No family members are present in the Emergency Room.   TIME SPENT: 50 minutes.   ____________________________ Hart Rochester Posey Pronto, MD sap:cbb D: 04/05/2012 14:49:58 ET T: 04/05/2012 15:29:59 ET JOB#: [id number]  cc: Valerian Jewel A. Posey Pronto, MD, <Dictator> Eduard Clos. Gilford Rile, MD Ilda Basset MD ELECTRONICALLY SIGNED 04/06/2012 14:12

## 2014-12-01 NOTE — Discharge Summary (Signed)
PATIENT NAME:  Garrett Ferguson, Garrett Ferguson MR#:  [id number] DATE OF BIRTH:  [birth year]  DATE OF ADMISSION:  04/05/2012 DATE OF DISCHARGE:  04/07/2012   DIAGNOSES:  1. SIRS due to pyelonephritis.  2. Escherichia coli urinary tract infection. 3. Hypertension. 4. Restless leg syndrome.  5. Gastroesophageal reflux disease. 6. Constipation.   DISPOSITION: The patient is being discharged home.   FOLLOW-UP: 1. Follow-up with Dr. Jacqlyn Larsen in 1 to 2 weeks after discharge.  2. Follow-up with PCP, Dr. Arline Asp, in 1 to 2 weeks after discharge.   DIET: Low sodium.   ACTIVITY: As tolerated.   DISCHARGE MEDICATIONS:  1. Levaquin 500 mg daily for seven days.  2. Pramipexole 0.5 mg 2 tablets twice a day.  3. Multivitamin 1 tablet once a day.  4. Taztia XT 360 mg once a day.  5. Hyophen 1 tablet q.i.d.  6. Feosol 325 mg 1 tablet once a day. 7. Nitrofurantoin 100 mg daily.  8. Xanax 0.5 mg b.i.d.  9. Dexilant 60 mg daily.  10. Quinapril 40 mg daily.  11. Imipramine 25 mg daily.   LABORATORY, DIAGNOSTIC, AND RADIOLOGICAL DATA: Urine culture Escherichia coli. Blood cultures no growth so far.   Chest x-ray showed no acute abnormality.   White count 21.4 on admission, 13.6 by the time of discharge. Normal hemoglobin. Normal platelet count. Lipase 100. Glucose 102. BUN 16. Creatinine 0.93. Sodium 135. Rest of CMP normal.   HOSPITAL COURSE: The patient is a 69 year old male with past medical history of bladder cancer status post resection, gastroesophageal reflux disease, bladder malakoplakia with recurrent UTIs, hypertension, and restless leg syndrome who presented with fever and right flank pain. He was admitted with a diagnosis of SIRS with leukocytosis, fever, and tachycardia possibly due to UTI and acute pyelonephritis. His blood cultures were sent which have shown no growth so far. Urine culture grew Escherichia coli. He was initially treated with IV antibiotics and has been switched to oral antibiotics. His  blood pressure remained well controlled during the hospitalization. After his admission he remained afebrile. His white count improved. He did not have any urinary symptoms.   DISPOSITION: He is being discharged to nursing home in a stable condition.   TIME SPENT: 45 minutes.   ____________________________ Cherre Huger, MD sp:drc D: 04/07/2012 13:06:57 ET T: 04/07/2012 14:29:29 ET JOB#: [id number]  cc: Cherre Huger, MD, <Dictator> Denice Bors. Jacqlyn Larsen, MD Vianne Bulls. Arline Asp, MD Cherre Huger MD ELECTRONICALLY SIGNED 04/08/2012 13:30

## 2014-12-06 NOTE — Op Note (Signed)
PATIENT NAME:  Garrett Ferguson, Garrett Ferguson MR#:  [id number] DATE OF BIRTH:  Jun 06, 1946  DATE OF PROCEDURE:  12/12/2011  PREOPERATIVE DIAGNOSES: Bladder tumor, history of bladder cancer.   POSTOPERATIVE DIAGNOSES: Bladder tumor, history of bladder cancer.   PROCEDURES: Cystoscopy, bladder biopsy, and mitomycin bladder instillation.   SURGEON: Edrick Oh, M.D.   ANESTHESIA: Laryngeal mask airway anesthesia.   INDICATIONS: The patient is a 69 year old gentleman with a history of carcinoma in situ. He has undergone multiple prior therapies. He was recently noted to have abnormal areas of mucosa on recent cystoscopy. 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 He presents today for bladder biopsy.   DESCRIPTION OF PROCEDURE: After informed consent was obtained, the patient was taken to the operating room and placed in the dorsal lithotomy position under laryngeal mask airway anesthesia. The patient was then prepped and draped in the usual standard fashion. The 81 French rigid cystoscope was introduced into the urethra. Multiple strictures were encountered. Some of these required dilation. With insertion of the scope, the prostate fossa was noted to be open. Some keratinizing squamous metaplasia was noted throughout the bulbar urethra and prostate urethra. Upon entering the bladder, the mucosa was inspected in its entirety. There were areas of irregular raised erythematous mucosa with some hypervascularity. On the anterior wall of the bladder, there were at least 7 to 8 different areas with the largest being approximately 5 to 6 mm in size. With further examination there was also an area of irregular mucosa noted on the left lateral wall. Cold cup biopsies were obtained throughout most of these areas. Some of the smaller lesions were cauterized utilizing the Bugbee electrode. The sites of biopsy were also cauterized utilizing the Bugbee electrode. No significant bleeding was encountered. No  other lesions were appreciated. The bladder was then drained, the cystoscope was removed, and a 16 French Foley catheter was then placed to gravity drainage.  20 mg of mitomycin reconstituted in 50 mL of sterile water was instilled through the catheter into the urinary bladder. A catheter plug     was then placed. The patient was returned to the supine position and awakened from laryngeal mask airway anesthesia. He was taken to the recovery room in stable condition. There were no problems or complications. The patient tolerated the procedure well.  ____________________________ Denice Bors. Jacqlyn Larsen, MD bsc:slb D: 12/12/2011 15:08:57 ET T: 12/12/2011 15:49:30 ET JOB#: [id number]  cc: Denice Bors. Jacqlyn Larsen, MD, <Dictator> Denice Bors Kaliopi Blyden MD ELECTRONICALLY SIGNED 12/12/2011 16:06

## 2015-02-01 ENCOUNTER — Telehealth: Payer: Self-pay

## 2015-02-01 NOTE — Telephone Encounter (Signed)
I called the patient and LVM asking him to call back to r/s appointment on 6/29. I have also mailed a letter requesting that he call to r/s.

## 2015-02-08 ENCOUNTER — Other Ambulatory Visit: Payer: Self-pay

## 2015-02-08 NOTE — Telephone Encounter (Signed)
Appointment r/s to 7/1.

## 2015-02-10 ENCOUNTER — Ambulatory Visit: Payer: Medicare Other | Admitting: Neurology

## 2015-02-12 ENCOUNTER — Encounter: Payer: Self-pay | Admitting: Neurology

## 2015-02-12 ENCOUNTER — Ambulatory Visit (INDEPENDENT_AMBULATORY_CARE_PROVIDER_SITE_OTHER): Payer: Medicare Other | Admitting: Neurology

## 2015-02-12 VITALS — BP 159/88 | HR 82 | Ht 71.0 in | Wt 251.4 lb

## 2015-02-12 DIAGNOSIS — G2581 Restless legs syndrome: Secondary | ICD-10-CM | POA: Diagnosis not present

## 2015-02-12 MED ORDER — PRAMIPEXOLE DIHYDROCHLORIDE 0.5 MG PO TABS: ORAL_TABLET | ORAL | Status: DC

## 2015-02-12 MED ORDER — GABAPENTIN 300 MG PO CAPS
ORAL_CAPSULE | ORAL | Status: DC
Start: 2015-02-12 — End: 2016-03-12

## 2015-02-12 NOTE — Progress Notes (Signed)
Reason for visit: Restless leg syndrome  Garrett Ferguson is an 69 y.o. male  History of present illness:  Garrett Ferguson is a 69 year old right-handed white male with a history of severe restless leg syndrome. The symptoms continued to worsen over time, the patient is having symptoms throughout the entire day, oftentimes worse at night. The patient is on Mirapex taking a total of 3 mg daily and he is on 600 mg of gabapentin at night. The patient is having difficulty sleeping. He has had a recent ferritin level done that is in the low normal range. He recently has had some issues with the left knee with a Baker's cyst, he currently is on Coumadin therapy. The patient denies any recent falls, he does use a cane for ambulation. He returns to the office today for an evaluation.  Past Medical History  Diagnosis Date  . Hypertension   . H/O: rheumatic fever     age 71  . Dysrhythmia     saw cardiologist 2006 for rt bundle branch block - has not seen since  . Restless leg syndrome   . Arthritis   . RSD (reflex sympathetic dystrophy)     L hand  . GERD (gastroesophageal reflux disease)   . History of ulcer disease 1970's  . Chronic kidney disease     recent UTI / HX bladder cancer  . Cancer     bladder cancer  . Hearing loss   . Incontinence of urine   . 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 Baker's cyst, ruptured     Past Surgical History  Procedure Laterality Date  . Joint replacement  replacements x3    l knee with multiple surg on same knee  . Cholecystectomy  1979  . Nasal sinus surgery  1990's  . Foot surgery      x5  . Transurethral resection of prostate    . Prostate biopsy    . Carpall tunnel      L hand  . Total knee revision  04/23/2012    Procedure: TOTAL KNEE REVISION;  Surgeon: Garrett Bridge, MD;  Location: [facility];  Service: Orthopedics;  Laterality: Left;  . Bladder surgery      transitional cell carcinoma    Family History  Problem Relation Age  of Onset  . Cancer    . Melanoma    . High blood pressure    . Cancer Father   . Melanoma Father   . Heart disease Sister     Social history:  reports that he has never smoked. He has never used smokeless tobacco. He reports that he does not drink alcohol or use illicit drugs.    Allergies  Allergen Reactions  . Penicillins Rash    Medications:  Prior to Admission medications   Medication Sig Start Date End Date Taking? Authorizing Provider  HYDROcodone-acetaminophen (NORCO/VICODIN) 5-325 MG per tablet Take 1 tablet by mouth every 6 (six) hours as needed. 01/07/15  Yes Historical Provider, MD  warfarin (COUMADIN) 5 MG tablet Take 5 mg by mouth daily. 02/04/15 02/04/16 Yes Historical Provider, MD  ALPRAZolam Duanne Moron) 0.5 MG tablet Take 1 tablet (0.5 mg total) by mouth 2 (two) times daily as needed for anxiety. If needed 09/18/14   Kathrynn Ducking, MD  gabapentin (NEURONTIN) 300 MG capsule Take 2 capsules (600 mg total) by mouth at bedtime. 08/10/14   Kathrynn Ducking, MD  metoprolol succinate (TOPROL-XL) 100 MG 24 hr tablet Take  100 mg by mouth. One half tab daily 03/30/14   Historical Provider, MD  Multiple Vitamin (MULTIVITAMIN WITH MINERALS) TABS Take 1 tablet by mouth daily.    Historical Provider, MD  pramipexole (MIRAPEX) 0.5 MG tablet TAKE ONE TABLET BY MOUTH WITH MEALS AND THREE AT BEDTIME 08/25/14   Kathrynn Ducking, MD  quinapril (ACCUPRIL) 40 MG tablet Take 40 mg by mouth every morning.    Historical Provider, MD  sertraline (ZOLOFT) 100 MG tablet Take 200 mg by mouth daily.  06/10/14 06/10/15  Historical Provider, MD    ROS:  Out of a complete 14 system review of symptoms, the patient complains only of the following symptoms, and all other reviewed systems are negative.  Restless legs, insomnia Frequency of urination, blood in urine, urinary urgency Joint pain, joint swelling, walking difficulty Depression  Blood pressure 159/88, pulse 82, height 5\' 11"  (1.803 m), weight  251 lb 6.4 oz (114.034 kg).  Physical Exam  General: The patient is alert and cooperative at the time of the examination.  Skin: 1+ edema is noted in the right leg below the knee, 2+ on the left.   Neurologic Exam  Mental status: The patient is alert and oriented x 3 at the time of the examination. The patient has apparent normal recent and remote memory, with an apparently normal attention span and concentration ability.   Cranial nerves: Facial symmetry is present. Speech is normal, no aphasia or dysarthria is noted. Extraocular movements are full. Visual fields are full.  Motor: The patient has good strength in all 4 extremities.  Sensory examination: Soft touch sensation is symmetric on the face, arms, and legs.  Coordination: The patient has good finger-nose-finger and heel-to-shin bilaterally.  Gait and station: The patient has a normal gait. Tandem gait is slightly unsteady. Romberg is negative. No drift is seen.  Reflexes: Deep tendon reflexes are symmetric.   Assessment/Plan:  1. Restless leg syndrome  The patient is having severe symptoms with the restless leg syndrome, the symptoms continued to worsen over time. The gabapentin will be increased taking 300 mg twice during the day and 600 milligrams at night. We could potentially go up on the Mirapex in the future as well. I have discussed the possibility of clinical research for restless leg syndrome, the patient is interested in learning more about our clinical trial. The patient will follow-up in 6 months.  Garrett Alexanders MD 02/12/2015 6:05 PM  [facility] [address] [place], [place] [address]  Phone [phone] Fax [phone]

## 2015-02-12 NOTE — Patient Instructions (Signed)
Restless Legs Syndrome Restless legs syndrome is a movement disorder. It may also be called a sensorimotor disorder.  CAUSES  No one knows what specifically causes restless legs syndrome, but it tends to run in families. It is also more common in people with low iron, in pregnancy, in people who need dialysis, and those with nerve damage (neuropathy).Some medications may make restless legs syndrome worse.Those medications include drugs to treat high blood pressure, some heart conditions, nausea, colds, allergies, and depression. SYMPTOMS Symptoms include uncomfortable sensations in the legs. These leg sensations are worse during periods of inactivity or rest. They are also worse while sitting or lying down. Individuals that have the disorder describe sensations in the legs that feel like:  Pulling.  Drawing.  Crawling.  Worming.  Boring.  Tingling.  Pins and needles.  Prickling.  Pain. The sensations are usually accompanied by an overwhelming urge to move the legs. Sudden muscle jerks may also occur. Movement provides temporary relief from the discomfort. In rare cases, the arms may also be affected. Symptoms may interfere with going to sleep (sleep onset insomnia). Restless legs syndrome may also be related to periodic limb movement disorder (PLMD). PLMD is another more common motor disorder. It also causes interrupted sleep. The symptoms from PLMD usually occur most often when you are awake. TREATMENT  Treatment for restless legs syndrome is symptomatic. This means that the symptoms are treated.   Massage and cold compresses may provide temporary relief.  Walk, stretch, or take a cold or hot bath.  Get regular exercise and a good night's sleep.  Avoid caffeine, alcohol, nicotine, and medications that can make it worse.  Do activities that provide mental stimulation like discussions, needlework, and video games. These may be helpful if you are not able to walk or stretch. Some  medications are effective in relieving the symptoms. However, many of these medications have side effects. Ask your caregiver about medications that may help your symptoms. Correcting iron deficiency may improve symptoms for some patients. Document Released: 07/21/2002 Document Revised: 12/15/2013 Document Reviewed: 10/27/2010 ExitCare Patient Information 2015 [employer]. This information is not intended to replace advice given to you by your health care provider. Make sure you discuss any questions you have with your health care provider.  

## 2015-03-24 ENCOUNTER — Telehealth: Payer: Self-pay | Admitting: *Deleted

## 2015-03-24 MED ORDER — PRAMIPEXOLE DIHYDROCHLORIDE 1 MG PO TABS: ORAL_TABLET | ORAL | Status: DC

## 2015-03-24 NOTE — Telephone Encounter (Signed)
Please let Garrett Ferguson go through the enrollment criteria.  The patient cannot have more than 1 drug for the treatment of restless legs, has to be weaned off the medication and oral iron, has no neuropathic or cramping pain in the legs, no akathisia, renal or liver disease,  and no history of spinal stenosis.

## 2015-03-24 NOTE — Telephone Encounter (Signed)
I called patient. The patient is having increasing problems with restless legs, I'll increase the Mirapex taking 1 mg twice during the day, 1.5 mg at night. I will make another referral for the research trial, the patient has not heard anything.

## 2015-03-24 NOTE — Telephone Encounter (Signed)
I called the patient. He stated for the last 5-6 days his restless legs have been terrible. They bother him day and night. He cannot tell a difference since increasing his Gabapentin from the last office visit (7/1). He stated he did have a kidney infection last week and took Doxycycline but he has completed that now. He would also like to talk to Dr. Jannifer Franklin about a RLS study. He stated he never heard anything back about it after expressing interest. He did have the wrong number listed and that may have been why. I have corrected that now.

## 2015-08-20 ENCOUNTER — Ambulatory Visit (INDEPENDENT_AMBULATORY_CARE_PROVIDER_SITE_OTHER): Payer: Medicare HMO | Admitting: Neurology

## 2015-08-20 ENCOUNTER — Encounter: Payer: Self-pay | Admitting: Neurology

## 2015-08-20 VITALS — BP 153/87 | HR 63 | Ht 71.0 in | Wt 258.0 lb

## 2015-08-20 DIAGNOSIS — G2581 Restless legs syndrome: Secondary | ICD-10-CM

## 2015-08-20 MED ORDER — PRAMIPEXOLE DIHYDROCHLORIDE 1 MG PO TABS: ORAL_TABLET | ORAL | Status: DC

## 2015-08-20 NOTE — Progress Notes (Signed)
Reason for visit: Restless leg syndrome  Garrett Ferguson is an 70 y.o. male  History of present illness:  Garrett Ferguson is a 70 year old right-handed white male with a history of severe restless leg syndrome. The patient has been on gabapentin and Mirapex without complete benefit. The patient will have issues all day long, but are worse at night. 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 He has not been a sleep well at times, he finds that alprazolam will help him sleep when the restless legs is severe. The patient was referred for the restless legs study trial, but he was found not to be candidate as he is on more than one agent for restless leg syndrome. He returns to the office for an evaluation. His last ferritin level was 60.  Past Medical History  Diagnosis Date  . Hypertension   . H/O: rheumatic fever     age 78  . Dysrhythmia     saw cardiologist 2006 for rt bundle branch block - has not seen since  . Restless leg syndrome   . Arthritis   . RSD (reflex sympathetic dystrophy)     L hand  . GERD (gastroesophageal reflux disease)   . History of ulcer disease 1970's  . Chronic kidney disease     recent UTI / HX bladder cancer  . Cancer [facility])     bladder cancer  . Hearing loss   . Incontinence of urine   . Failed total left knee replacement ([place]) 04/23/2012  . Baker's cyst, ruptured     Past Surgical History  Procedure Laterality Date  . Joint replacement  replacements x3    l knee with multiple surg on same knee  . Cholecystectomy  1979  . Nasal sinus surgery  1990's  . Foot surgery      x5  . Transurethral resection of prostate    . Prostate biopsy    . Carpall tunnel      L hand  . Total knee revision  04/23/2012    Procedure: TOTAL KNEE REVISION;  Surgeon: Johnny Bridge, MD;  Location: [facility];  Service: Orthopedics;  Laterality: Left;  . Bladder surgery      transitional cell carcinoma    Family History  Problem Relation Age of Onset  . Cancer    .  Melanoma    . High blood pressure    . Cancer Father   . Melanoma Father   . Heart disease Sister     Social history:  reports that he has never smoked. He has never used smokeless tobacco. He reports that he does not drink alcohol or use illicit drugs.    Allergies  Allergen Reactions  . Penicillins Rash    Medications:  Prior to Admission medications   Medication Sig Start Date End Date Taking? Authorizing Parlee Amescua  ALPRAZolam Duanne Moron) 0.5 MG tablet Take 1 tablet (0.5 mg total) by mouth 2 (two) times daily as needed for anxiety. If needed 09/18/14  Yes Kathrynn Ducking, MD  gabapentin (NEURONTIN) 300 MG capsule 1 capsule in the morning and at noon, 2 capsules at night 02/12/15  Yes Kathrynn Ducking, MD  HYDROcodone-acetaminophen (NORCO/VICODIN) 5-325 MG per tablet Take 1 tablet by mouth every 6 (six) hours as needed. 01/07/15  Yes Historical Dacoda Finlay, MD  metoprolol succinate (TOPROL-XL) 100 MG 24 hr tablet Take 100 mg by mouth daily.  03/30/14  Yes Historical Maude Gloor, MD  Multiple Vitamin (MULTIVITAMIN WITH MINERALS)  TABS Take 1 tablet by mouth daily.   Yes Historical Jamarr Treinen, MD  nitrofurantoin (MACRODANTIN) 100 MG capsule Take 100 mg by mouth at bedtime.   Yes Historical Ingra Rother, MD  pramipexole (MIRAPEX) 1 MG tablet 1 tablet twice during the day, 1.5 tablets at night 03/24/15  Yes Kathrynn Ducking, MD  quinapril (ACCUPRIL) 40 MG tablet Take 40 mg by mouth every morning.   Yes Historical Gaberial Cada, MD  tamsulosin (FLOMAX) 0.4 MG CAPS capsule Take 0.4 mg by mouth daily.   Yes Historical Patriciaann Rabanal, MD  warfarin (COUMADIN) 5 MG tablet Take 5 mg by mouth daily. 02/04/15 02/04/16 Yes Historical Sharrie Self, MD  sertraline (ZOLOFT) 100 MG tablet Take 200 mg by mouth daily.  06/10/14 06/10/15  Historical Gema Ringold, MD    ROS:  Out of a complete 14 system review of symptoms, the patient complains only of the following symptoms, and all other reviewed systems are negative.  Leg swelling Rectal  bleeding, diarrhea Restless legs Frequency of urination, blood in urine, urinary urgency Joint pain, walking difficulty  Blood pressure 153/87, pulse 63, height 5\' 11"  (1.803 m), weight 258 lb (117.028 kg).  Physical Exam  General: The patient is alert and cooperative at the time of the examination. The patient is moderately to markedly obese.  Skin: No significant peripheral edema is noted.   Neurologic Exam  Mental status: The patient is alert and oriented x 3 at the time of the examination. The patient has apparent normal recent and remote memory, with an apparently normal attention span and concentration ability.   Cranial nerves: Facial symmetry is present. Speech is normal, no aphasia or dysarthria is noted. Extraocular movements are full. Visual fields are full.  Motor: The patient has good strength in all 4 extremities.  Sensory examination: Soft touch sensation is symmetric on the face, arms, and legs.  Coordination: The patient has good finger-nose-finger and heel-to-shin bilaterally.  Gait and station: The patient has a normal gait. Tandem gait is slightly unsteady. Romberg is negative. No drift is seen.  Reflexes: Deep tendon reflexes are symmetric, but are depressed.   Assessment/Plan:  1. Severe restless leg syndrome  The patient continues to have ongoing issues with restless legs. The patient will be sent for IV iron therapy using Feraheme 510 mg infusion. I will try to get this set up through our office or through [facility]. The patient was given a prescription for his Mirapex. He will follow-up in 6 months, sooner if needed. If the IV iron therapy is helpful, this can be repeated in the future.  Jill Alexanders MD 08/20/2015 3:48 PM  [facility] [address] [place], [place] [address]  Phone [phone] Fax [phone]

## 2015-08-20 NOTE — Patient Instructions (Signed)
   We will get you set up for IV Iron to help the restless legs.   Restless Legs Syndrome Restless legs syndrome is a condition that causes uncomfortable feelings or sensations in the legs, especially while sitting or lying down. The sensations usually cause an overwhelming urge to move the legs. The arms can also sometimes be affected. The condition can range from mild to severe. The symptoms often interfere with a person's ability to sleep. CAUSES The cause of this condition is not known. RISK FACTORS This condition is more likely to develop in:  People who are older than age 79.  Pregnant women. In general, restless legs syndrome is more common in women than in men.  People who have a family history of the condition.  People who have certain medical conditions, such as iron deficiency, kidney disease, Parkinson disease, or nerve damage.  People who take certain medicines, such as medicines for high blood pressure, nausea, colds, allergies, depression, and some heart conditions. SYMPTOMS The main symptom of this condition is uncomfortable sensations in the legs. These sensations may be:  Described as pulling, tingling, prickling, throbbing, crawling, or burning.  Worse while you are sitting or lying down.  Worse during periods of rest or inactivity.  Worse at night, often interfering with your sleep.  Accompanied by a very strong urge to move your legs.  Temporarily relieved by movement of your legs. The sensations usually affect both sides of the body. The arms can also be affected, but this is rare. People who have this condition often have tiredness during the day because of their lack of sleep at night. DIAGNOSIS This condition may be diagnosed based on your description of the symptoms. You may also have tests, including blood tests, to check for other conditions that may lead to your symptoms. In some cases, you may be asked to spend some time in a sleep lab so your  sleeping can be monitored. TREATMENT Treatment for this condition is focused on managing the symptoms. Treatment may include:  Self-help and lifestyle changes.  Medicines. HOME CARE INSTRUCTIONS  Take medicines only as directed by your health care provider.  Try these methods to get temporary relief from the uncomfortable sensations:  Massage your legs.  Walk or stretch.  Take a cold or hot bath.  Practice good sleep habits. For example, go to bed and get up at the same time every day.  Exercise regularly.  Practice ways of relaxing, such as yoga or meditation.  Avoid caffeine and alcohol.  Do not use any tobacco products, including cigarettes, chewing tobacco, or electronic cigarettes. If you need help quitting, ask your health care provider.  Keep all follow-up visits as directed by your health care provider. This is important. SEEK MEDICAL CARE IF: Your symptoms do not improve with treatment, or they get worse.   This information is not intended to replace advice given to you by your health care provider. Make sure you discuss any questions you have with your health care provider.   Document Released: 07/21/2002 Document Revised: 12/15/2014 Document Reviewed: 07/27/2014 Elsevier Interactive Patient Education [employer].

## 2016-01-01 ENCOUNTER — Encounter (HOSPITAL_COMMUNITY): Payer: Self-pay | Admitting: *Deleted

## 2016-01-01 ENCOUNTER — Emergency Department (HOSPITAL_COMMUNITY)
Admission: EM | Admit: 2016-01-01 | Discharge: 2016-01-02 | Disposition: A | Discharge status: Home / Self Care | Payer: No Typology Code available for payment source | Attending: Emergency Medicine | Admitting: Emergency Medicine

## 2016-01-01 ENCOUNTER — Emergency Department (HOSPITAL_COMMUNITY): Payer: No Typology Code available for payment source

## 2016-01-01 DIAGNOSIS — Z79899 Other long term (current) drug therapy: Secondary | ICD-10-CM | POA: Diagnosis not present

## 2016-01-01 DIAGNOSIS — Z7982 Long term (current) use of aspirin: Secondary | ICD-10-CM | POA: Diagnosis not present

## 2016-01-01 DIAGNOSIS — I10 Essential (primary) hypertension: Secondary | ICD-10-CM | POA: Insufficient documentation

## 2016-01-01 DIAGNOSIS — S82302A Unspecified fracture of lower end of left tibia, initial encounter for closed fracture: Secondary | ICD-10-CM | POA: Diagnosis not present

## 2016-01-01 DIAGNOSIS — M1990 Unspecified osteoarthritis, unspecified site: Secondary | ICD-10-CM | POA: Diagnosis not present

## 2016-01-01 DIAGNOSIS — S82402A Unspecified fracture of shaft of left fibula, initial encounter for closed fracture: Secondary | ICD-10-CM

## 2016-01-01 DIAGNOSIS — Z8551 Personal history of malignant neoplasm of bladder: Secondary | ICD-10-CM | POA: Diagnosis not present

## 2016-01-01 DIAGNOSIS — M25572 Pain in left ankle and joints of left foot: Secondary | ICD-10-CM | POA: Diagnosis present

## 2016-01-01 DIAGNOSIS — Y92410 Unspecified street and highway as the place of occurrence of the external cause: Secondary | ICD-10-CM | POA: Diagnosis not present

## 2016-01-01 DIAGNOSIS — Y999 Unspecified external cause status: Secondary | ICD-10-CM | POA: Diagnosis not present

## 2016-01-01 DIAGNOSIS — Y9389 Activity, other specified: Secondary | ICD-10-CM | POA: Insufficient documentation

## 2016-01-01 MED ORDER — IBUPROFEN 800 MG PO TABS: 800.0000 mg | ORAL_TABLET | Freq: Three times a day (TID) | ORAL | Status: DC

## 2016-01-01 MED ORDER — HYDROCODONE-ACETAMINOPHEN 5-325 MG PO TABS: 2.0000 | ORAL_TABLET | ORAL | Status: DC | PRN

## 2016-01-01 MED ORDER — HYDROMORPHONE HCL 1 MG/ML IJ SOLN
0.5000 mg | Freq: Once | INTRAMUSCULAR | Status: AC
  Administered 2016-01-01: 0.5 mg via INTRAMUSCULAR
  Filled 2016-01-01: qty 1

## 2016-01-01 MED ORDER — OXYCODONE-ACETAMINOPHEN 5-325 MG PO TABS
2.0000 | ORAL_TABLET | Freq: Once | ORAL | Status: AC
  Administered 2016-01-01: 2 via ORAL
  Filled 2016-01-01: qty 2

## 2016-01-01 MED ORDER — DOCUSATE SODIUM 100 MG PO CAPS: 100.0000 mg | ORAL_CAPSULE | Freq: Two times a day (BID) | ORAL | Status: DC
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
## 2016-01-01 NOTE — Discharge Instructions (Signed)
You are NOT to bear weight on your ankle until you see the surgeon Keep you splint on until you see the orthopedic surgeon, you can see the surgeon of your choice but if you don't have a surgeon, you may call Dr. Percell Mirl Hillery - see phone number attached.  Your foot xray is normal  Keep your foot elevated, ice and ibuprofen.   Hydrocodone for severe pain  Return to the ER immediately for severe pain, swelling, numbness of the foot or leg.

## 2016-01-01 NOTE — ED Notes (Signed)
I called patients wife at [facility] and informed her of patients condition per his wife. He and his wife spoke afterward
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
## 2016-01-02 NOTE — ED Notes (Signed)
Pt has diffuse bruising and swelling noted to left foot, shin, and toes. Pt has feeling to all toes, strong pulse, and <3 cap refill prior to casting.

## 2016-01-02 NOTE — ED Notes (Signed)
Pt has <3 cap refill, feeling in all toes, and full movement of toes.

## 2016-02-09 ENCOUNTER — Ambulatory Visit: Payer: Medicare Other | Admitting: Neurology

## 2016-03-12 ENCOUNTER — Other Ambulatory Visit: Payer: Self-pay | Admitting: Neurology

## 2016-04-06 ENCOUNTER — Other Ambulatory Visit: Payer: Self-pay | Admitting: Neurology

## 2016-10-11 ENCOUNTER — Telehealth: Payer: Self-pay | Admitting: Neurology

## 2016-10-11 ENCOUNTER — Ambulatory Visit (HOSPITAL_COMMUNITY): Payer: No Typology Code available for payment source | Admitting: Physical Therapy

## 2016-10-13 ENCOUNTER — Ambulatory Visit (INDEPENDENT_AMBULATORY_CARE_PROVIDER_SITE_OTHER): Payer: Medicare HMO | Admitting: Nurse Practitioner

## 2016-10-18 ENCOUNTER — Ambulatory Visit (INDEPENDENT_AMBULATORY_CARE_PROVIDER_SITE_OTHER): Payer: Medicare HMO | Admitting: Nurse Practitioner

## 2016-10-18 ENCOUNTER — Encounter: Payer: Self-pay | Admitting: Nurse Practitioner

## 2016-10-18 ENCOUNTER — Other Ambulatory Visit: Payer: Self-pay | Admitting: Neurology

## 2016-10-18 VITALS — BP 160/76 | HR 63 | Resp 20 | Ht 71.0 in | Wt 276.8 lb

## 2016-10-18 DIAGNOSIS — G2581 Restless legs syndrome: Secondary | ICD-10-CM | POA: Diagnosis not present

## 2016-10-18 MED ORDER — GABAPENTIN 300 MG PO CAPS
ORAL_CAPSULE | ORAL | 1 refills | Status: DC
Start: 2016-10-18 — End: 2017-05-24

## 2016-10-18 MED ORDER — PRAMIPEXOLE DIHYDROCHLORIDE 1 MG PO TABS: ORAL_TABLET | ORAL | 1 refills | Status: AC

## 2016-10-18 NOTE — Progress Notes (Signed)
I have read the note, and I agree with the clinical assessment and plan.  Garrett Ferguson KEITH   
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
## 2016-10-18 NOTE — Patient Instructions (Signed)
Will check ferritin and iron studies today Will set up for iron infusion Continue Mirapex at current dose Follow up in 6 months

## 2016-10-19 ENCOUNTER — Telehealth: Payer: Self-pay

## 2016-10-19 ENCOUNTER — Telehealth (HOSPITAL_COMMUNITY): Payer: Self-pay

## 2016-10-19 LAB — IRON AND TIBC
IRON: 73 ug/dL (ref 38–169)
Iron Saturation: 19 % (ref 15–55)
Total Iron Binding Capacity: 377 ug/dL (ref 250–450)
UIBC: 304 ug/dL (ref 111–343)

## 2016-10-19 LAB — FERRITIN: FERRITIN: 35 ng/mL (ref 30–400)

## 2016-10-19 NOTE — Telephone Encounter (Signed)
Rn call patient about his lab work. Rn stated his iron studies and ferritin was at the low end of normal. RN stated IV infusion will be set up. IV nurse will give him a call to set up appt. Pt verbalized understanding.

## 2016-10-19 NOTE — Telephone Encounter (Signed)
-----   Message from Dennie Bible, NP sent at 10/19/2016 10:44 AM EST ----- Ferritin and iron studies low end of normal. Will arrange for IV infusion, IV nurse will be giving you a call to set up the appointment. Please call the pt.

## 2016-10-19 NOTE — Telephone Encounter (Signed)
10/19/16 pt left a message that he was going to cancel because the copay was to expensive.

## 2016-10-20 ENCOUNTER — Ambulatory Visit (HOSPITAL_COMMUNITY): Payer: No Typology Code available for payment source | Admitting: Physical Therapy

## 2016-10-20 ENCOUNTER — Encounter (HOSPITAL_COMMUNITY): Payer: Self-pay

## 2016-11-15 ENCOUNTER — Telehealth: Payer: Self-pay

## 2016-11-15 NOTE — Telephone Encounter (Signed)
Discussed with Otila Kluver, infusion RN. She stated she had left patient VM but had not heard from him. Called patient who stated he got the message but had not called back that day. This RN stated Otila Kluver needs to know what day and time of day is best. She will then schedule him and call him back. Patient stated any day but in the afternoons. Patient verbalized appreciation for call. information given to [place].

## 2016-11-15 NOTE — Telephone Encounter (Signed)
Message from answering service:  [place]
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
## 2017-04-23 ENCOUNTER — Ambulatory Visit: Payer: Medicare HMO | Admitting: Nurse Practitioner

## 2017-05-24 ENCOUNTER — Other Ambulatory Visit: Payer: Self-pay | Admitting: Nurse Practitioner

## 2017-10-10 NOTE — Telephone Encounter (Signed)
Error

## 2017-10-31 ENCOUNTER — Other Ambulatory Visit: Payer: Self-pay | Admitting: Nurse Practitioner

## 2017-10-31 ENCOUNTER — Other Ambulatory Visit: Payer: Self-pay | Admitting: Neurology

## 2017-11-01 NOTE — Telephone Encounter (Signed)
Note to pharmacy: have patient call and schedule FU.

## 2019-08-16 ENCOUNTER — Other Ambulatory Visit: Payer: Self-pay

## 2019-08-16 ENCOUNTER — Ambulatory Visit (INDEPENDENT_AMBULATORY_CARE_PROVIDER_SITE_OTHER): Payer: Medicare HMO

## 2019-08-16 ENCOUNTER — Ambulatory Visit
Admission: EM | Admit: 2019-08-16 | Discharge: 2019-08-16 | Disposition: A | Discharge status: Home / Self Care | Payer: Medicare HMO | Attending: Emergency Medicine | Admitting: Emergency Medicine

## 2019-08-16 DIAGNOSIS — R531 Weakness: Secondary | ICD-10-CM

## 2019-08-16 DIAGNOSIS — Z20822 Contact with and (suspected) exposure to covid-19: Secondary | ICD-10-CM

## 2019-08-16 DIAGNOSIS — R05 Cough: Secondary | ICD-10-CM

## 2019-08-16 MED ORDER — BENZONATATE 100 MG PO CAPS: 100.0000 mg | ORAL_CAPSULE | Freq: Three times a day (TID) | ORAL | 0 refills | Status: DC

## 2019-08-16 MED ORDER — FLUTICASONE PROPIONATE 50 MCG/ACT NA SUSP: 1.0000 | Freq: Every day | NASAL | 0 refills | Status: DC

## 2019-08-16 NOTE — ED Triage Notes (Signed)
Pt presents to UC w/ c/o runny nose,cough x2-3 days. Pt states wife at home has covid.

## 2019-08-16 NOTE — Discharge Instructions (Addendum)
Chest x-ray showed possible viral pneumonia  COVID testing ordered.  It will take between 2-7 days for test results.  Someone will contact you regarding abnormal results.    In the meantime: You should remain isolated in your home for 10 days from symptom onset  Get plenty of rest and push fluids Use medications daily for symptom relief Use OTC medications like ibuprofen or tylenol as needed fever or pain Call or go to the ED if you have any new or worsening symptoms such as fever, worsening cough, shortness of breath, chest tightness, chest pain, turning blue, changes in mental status, etc..Marland Kitchen

## 2019-08-16 NOTE — ED Provider Notes (Signed)
[facility]    CSN: [id number] Arrival date & time: 08/16/19  1357      History   Chief Complaint Chief Complaint  Patient presents with  . covid test/cough    HPI Garrett Ferguson is a 74 y.o. male.   Garrett Ferguson 74 years old man presented to the urgent care with a complaint of cough, runny nose for the past 2 to 3 days.  Report wife tested positive for COVID-19.  Denies sick exposure to flu or strep.  Denies recent travel.  Denies aggravating or alleviating symptoms.  Denies previous COVID infection.   Denies fever, chills, fatigue, nasal congestion, rh, sore throat, SOB, wheezing, chest pain, nausea, vomiting, changes in bowel or bladder habits.    The history is provided by the patient. A language interpreter was used.    Past Medical History:  Diagnosis Date  . Arthritis   . Baker's cyst, ruptured   . Cancer [facility])    bladder cancer  . Chronic kidney disease    recent UTI / HX bladder cancer  . Dysrhythmia    saw cardiologist 2006 for rt bundle branch block - has not seen since  . 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 GERD (gastroesophageal reflux disease)   . H/O: rheumatic fever    age 70  . Hearing loss   . History of ulcer disease 1970's  . Hypertension   . Incontinence of urine   . Restless leg syndrome   . RSD (reflex sympathetic dystrophy)    L hand    Patient Active Problem List   Diagnosis Date Noted  . Restless legs syndrome (RLS) 08/08/2013  . 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 BLADDER SURGERY     transitional cell carcinoma  . carpall tunnel     L hand  . CHOLECYSTECTOMY  1979  . FOOT SURGERY     x5  . JOINT REPLACEMENT  replacements x3   l knee with multiple surg on same knee  . NASAL SINUS SURGERY  1990's  . PROSTATE BIOPSY    . TOTAL KNEE REVISION  04/23/2012   Procedure: TOTAL KNEE REVISION;  Surgeon: Johnny Bridge, MD;  Location:  [facility];  Service: Orthopedics;  Laterality: Left;  . TRANSURETHRAL RESECTION OF PROSTATE         Home Medications    Prior to Admission medications   Medication Sig Start Date End Date Taking? Authorizing Provider  aspirin EC 81 MG tablet Take 81 mg by mouth daily.    [provider]  buPROPion (WELLBUTRIN XL) 150 MG 24 hr tablet  10/13/16   [provider]  furosemide (LASIX) 20 MG tablet Take 20 mg by mouth. 08/18/16   [provider]  gabapentin (NEURONTIN) 300 MG capsule TAKE 1 CAPSULE BY MOUTH IN THE MORNING, ONE CAPSULE AT [facility] AND TWO CAPSULES AT NIGHT 05/24/17   Dennie Bible, NP  metoprolol succinate (TOPROL-XL) 100 MG 24 hr tablet Take 100 mg by mouth daily.  03/30/14   [provider]  Multiple Vitamin (MULTIVITAMIN WITH MINERALS) TABS Take 1 tablet by mouth daily.    [provider]  nitrofurantoin (MACRODANTIN) 100 MG capsule Take 100 mg by mouth at bedtime.    [provider]  pramipexole (MIRAPEX) 1 MG tablet TAKE 1 TABLET BY MOUTH TWICE DURING THE DAY AND ONE AND ONE-HALF TABLETS AT NIGHT 10/18/16  Dennie Bible, NP  quinapril (ACCUPRIL) 40 MG tablet Take 40 mg by mouth every morning.    [provider]  ranitidine (ZANTAC) 150 MG tablet Take 150 mg by mouth daily.    [provider]  sertraline (ZOLOFT) 100 MG tablet Take 200 mg by mouth daily.  06/10/14 06/10/15  [provider]  sertraline (ZOLOFT) 100 MG tablet Take 200 mg by mouth daily.    [provider]  traMADol (ULTRAM) 50 MG tablet Take 50 mg by mouth every 6 (six) hours as needed for moderate pain.    [provider]    Family History Family History  Problem Relation Age of Onset  . Cancer Father   . Melanoma Father   . Heart disease Sister   . Cancer Other   . Melanoma Other   . High blood pressure Other     Social History Social History   Tobacco Use  . Smoking status: Never Smoker  .  Smokeless tobacco: Never Used  Substance Use Topics  . Alcohol use: No  . Drug use: No     Allergies   Penicillins   Review of Systems Review of Systems  Constitutional: Negative.   HENT: Positive for rhinorrhea.   Respiratory: Positive for cough.   Cardiovascular: Negative.   Gastrointestinal: Negative.   Neurological: Negative.      Physical Exam Triage Vital Signs ED Triage Vitals  Enc Vitals Group     BP 08/16/19 1509 (!) 176/77     Pulse Rate 08/16/19 1509 100     Resp 08/16/19 1509 20     Temp 08/16/19 1509 98.9 F (37.2 C)     Temp Source 08/16/19 1509 Oral     SpO2 08/16/19 1509 92 %     Weight --      Height --      Head Circumference --      Peak Flow --      Pain Score 08/16/19 1518 0     Pain Loc --      Pain Edu? --      Excl. in [place]? --    No data found.  Updated Vital Signs BP (!) 176/77 (BP Location: Left Arm)   Pulse 100   Temp 98.9 F (37.2 C) (Oral)   Resp 20   SpO2 92%   Visual Acuity Right Eye Distance:   Left Eye Distance:   Bilateral Distance:    Right Eye Near:   Left Eye Near:    Bilateral Near:     Physical Exam Vitals and nursing note reviewed.  Constitutional:      General: He is not in acute distress.    Appearance: Normal appearance. He is normal weight. He is not ill-appearing or toxic-appearing.  HENT:     Head: Normocephalic.     Right Ear: Tympanic membrane, ear canal and external ear normal. There is no impacted cerumen.     Left Ear: Ear canal and external ear normal. There is no impacted cerumen.     Nose: Nose normal. No congestion.     Mouth/Throat:     Mouth: Mucous membranes are moist.     Pharynx: No oropharyngeal exudate or posterior oropharyngeal erythema.  Cardiovascular:     Rate and Rhythm: Normal rate and regular rhythm.     Pulses: Normal pulses.     Heart sounds: Normal heart sounds. No murmur.  Pulmonary:     Effort: Pulmonary effort is normal. No respiratory  distress.     Breath sounds:  No wheezing or rhonchi.  Chest:     Chest wall: No tenderness.  Abdominal:     General: Abdomen is flat. Bowel sounds are normal. There is no distension.     Palpations: There is no mass.  Skin:    Capillary Refill: Capillary refill takes less than 2 seconds.  Neurological:     Mental Status: He is alert and oriented to person, place, and time.      UC Treatments / Results  Labs (all labs ordered are listed, but only abnormal results are displayed) Labs Reviewed  NOVEL CORONAVIRUS, NAA    EKG   Radiology DG Chest 2 View  Result Date: 08/16/2019 CLINICAL DATA:  Cough and weakness, recent COVID-19 exposure EXAM: CHEST - 2 VIEW COMPARISON:  04/07/2019 FINDINGS: Cardiac shadows within normal limits. Lungs are well aerated bilaterally. Mild interstitial changes are seen without focal infiltrate. This may represent some bronchitis although viral pneumonia could not be totally excluded. No bony abnormality is noted. IMPRESSION: Bilateral interstitial changes without significant edema. This could represent atypical/viral pneumonia. Electronically Signed   By: Inez Catalina M.D.   On: 08/16/2019 16:15    Procedures Procedures (including critical care time)  Medications Ordered in UC Medications - No data to display  Initial Impression / Assessment and Plan / UC Course  I have reviewed the triage vital signs and the nursing notes.  Pertinent labs & imaging results that were available during my care of the patient were reviewed by me and considered in my medical decision making (see chart for details).    Patient has been constantly coughing.  Chest x-ray was ordered and result review and patient was advised to go to ED for worsening of symptoms..  Advised patient to quarantine until COVID-19 test result become available.  To go to ED for worsening of symptoms patient verbalized understanding of the plan of care.  Final Clinical Impressions(s) / UC Diagnoses   Final diagnoses:    Suspected COVID-19 virus infection     Discharge Instructions     Chest x-ray showed possible viral pneumonia  COVID testing ordered.  It will take between 2-7 days for test results.  Someone will contact you regarding abnormal results.    In the meantime: You should remain isolated in your home for 10 days from symptom onset  Get plenty of rest and push fluids Use medications daily for symptom relief Use OTC medications like ibuprofen or tylenol as needed fever or pain Call or go to the ED if you have any new or worsening symptoms such as fever, worsening cough, shortness of breath, chest tightness, chest pain, turning blue, changes in mental status, etc...     ED Prescriptions    None     PDMP not reviewed this encounter.   Emerson Monte, [place] 08/16/19 [phone]

## 2019-08-17 LAB — NOVEL CORONAVIRUS, NAA: SARS-CoV-2, NAA: DETECTED — AB

## 2019-08-18 ENCOUNTER — Other Ambulatory Visit: Payer: Self-pay | Admitting: Nurse Practitioner

## 2019-08-18 DIAGNOSIS — U071 COVID-19: Secondary | ICD-10-CM

## 2019-08-18 DIAGNOSIS — I10 Essential (primary) hypertension: Secondary | ICD-10-CM

## 2019-08-18 NOTE — Progress Notes (Signed)
  I connected by phone with Helene Shoe on 08/18/2019 at 9:51 AM to discuss the potential use of an new treatment for mild to moderate COVID-19 viral infection in non-hospitalized patients.  This patient is a 74 y.o. male that meets the FDA criteria for Emergency Use Authorization of bamlanivimab or casirivimab\imdevimab.  Has a (+) direct SARS-CoV-2 viral test result  Has mild or moderate COVID-19   Is ? 75 years of age and weighs ? 40 kg  Is NOT hospitalized due to COVID-19  Is NOT requiring oxygen therapy or requiring an increase in baseline oxygen flow rate due to COVID-19  Is within 10 days of symptom onset  Has at least one of the high risk factor(s) for progression to severe COVID-19 and/or hospitalization as defined in EUA.  Specific high risk criteria : Hypertension   I have spoken and communicated the following to the patient or parent/caregiver:  1. FDA has authorized the emergency use of bamlanivimab and casirivimab\imdevimab for the treatment of mild to moderate COVID-19 in adults and pediatric patients with positive results of direct SARS-CoV-2 viral testing who are 1 years of age and older weighing at least 40 kg, and who are at high risk for progressing to severe COVID-19 and/or hospitalization.  2. The significant known and potential risks and benefits of bamlanivimab and casirivimab\imdevimab, and the extent to which such potential risks and benefits are unknown.  3. Information on available alternative treatments and the risks and benefits of those alternatives, including clinical trials.  4. Patients treated with bamlanivimab and casirivimab\imdevimab should continue to self-isolate and use infection control measures (e.g., wear mask, isolate, social distance, avoid sharing personal items, clean and disinfect "high touch" surfaces, and frequent handwashing) according to CDC guidelines.   5. The patient or parent/caregiver has the option to accept or refuse  bamlanivimab or casirivimab\imdevimab .  After reviewing this information with the patient, The patient agreed to proceed with receiving the bamlanimivab infusion and will be provided a copy of the Fact sheet prior to receiving the infusion.Fenton Foy 08/18/2019 9:51 AM

## 2019-08-19 ENCOUNTER — Telehealth: Payer: Self-pay | Admitting: Emergency Medicine

## 2019-08-19 NOTE — Telephone Encounter (Signed)

## 2019-08-20 ENCOUNTER — Ambulatory Visit (HOSPITAL_COMMUNITY)
Admission: RE | Admit: 2019-08-20 | Discharge: 2019-08-20 | Disposition: A | Discharge status: Home / Self Care | Payer: Medicare Other | Source: Ambulatory Visit | Attending: Pulmonary Disease | Admitting: Pulmonary Disease

## 2019-08-20 DIAGNOSIS — U071 COVID-19: Secondary | ICD-10-CM | POA: Diagnosis present

## 2019-08-20 DIAGNOSIS — Z23 Encounter for immunization: Secondary | ICD-10-CM | POA: Insufficient documentation

## 2019-08-20 DIAGNOSIS — I10 Essential (primary) hypertension: Secondary | ICD-10-CM

## 2019-08-20 MED ORDER — EPINEPHRINE 0.3 MG/0.3ML IJ SOAJ: 0.3000 mg | Freq: Once | INTRAMUSCULAR | Status: DC | PRN

## 2019-08-20 MED ORDER — ALBUTEROL SULFATE HFA 108 (90 BASE) MCG/ACT IN AERS: 2.0000 | INHALATION_SPRAY | Freq: Once | RESPIRATORY_TRACT | Status: DC | PRN

## 2019-08-20 MED ORDER — DIPHENHYDRAMINE HCL 50 MG/ML IJ SOLN: 50.0000 mg | Freq: Once | INTRAMUSCULAR | Status: DC | PRN

## 2019-08-20 MED ORDER — METHYLPREDNISOLONE SODIUM SUCC 125 MG IJ SOLR: 125.0000 mg | Freq: Once | INTRAMUSCULAR | Status: DC | PRN

## 2019-08-20 MED ORDER — FAMOTIDINE IN NACL 20-0.9 MG/50ML-% IV SOLN: 20.0000 mg | Freq: Once | INTRAVENOUS | Status: DC | PRN

## 2019-08-20 MED ORDER — SODIUM CHLORIDE 0.9 % IV SOLN
700.0000 mg | Freq: Once | INTRAVENOUS | Status: AC
  Administered 2019-08-20: 700 mg via INTRAVENOUS
  Filled 2019-08-20: qty 20

## 2019-08-20 MED ORDER — SODIUM CHLORIDE 0.9 % IV SOLN
INTRAVENOUS | Status: DC | PRN
  Administered 2019-08-20: 17:00:00 250 mL via INTRAVENOUS

## 2019-08-20 NOTE — Progress Notes (Signed)
  Diagnosis: COVID-19  Physician: Dr. Wright  Procedure: [facility] Med: bamlanivimab infusion - Provided patient with bamlanimivab fact sheet for patients, parents and caregivers prior to infusion.  Complications: No immediate complications noted.  Discharge: Discharged home   Kalli Greenfield 08/20/2019  

## 2019-08-20 NOTE — Discharge Instructions (Signed)

## 2020-08-24 ENCOUNTER — Other Ambulatory Visit: Payer: Self-pay | Admitting: Unknown Physician Specialty

## 2020-08-24 ENCOUNTER — Ambulatory Visit
Admission: RE | Admit: 2020-08-24 | Discharge: 2020-08-24 | Disposition: A | Discharge status: Home / Self Care | Payer: Medicare HMO | Source: Ambulatory Visit | Attending: Unknown Physician Specialty | Admitting: Unknown Physician Specialty

## 2020-08-24 ENCOUNTER — Other Ambulatory Visit: Payer: Self-pay

## 2020-08-24 ENCOUNTER — Ambulatory Visit
Admission: RE | Admit: 2020-08-24 | Discharge: 2020-08-24 | Disposition: A | Discharge status: Home / Self Care | Payer: Medicare HMO | Attending: Unknown Physician Specialty | Admitting: Unknown Physician Specialty

## 2020-08-24 DIAGNOSIS — J019 Acute sinusitis, unspecified: Secondary | ICD-10-CM

## 2020-08-24 DIAGNOSIS — J3489 Other specified disorders of nose and nasal sinuses: Secondary | ICD-10-CM | POA: Diagnosis present

## 2021-08-22 NOTE — Progress Notes (Deleted)
[facility] [place]  ASSESSMENT / PLAN: 76 y.o. male with *** - ***  CHIEF COMPLAINT: ***  HISTORY OF PRESENT ILLNESS: Garrett Ferguson is a 76 y.o. male ***  VASCULAR SURGICAL HISTORY: ***  VASCULAR RISK FACTORS: {FINDINGS; POSITIVE NEGATIVE:[id number]} history of stroke / transient ischemic attack. {FINDINGS; POSITIVE NEGATIVE:[id number]} history of coronary artery disease. *** history of PCI. *** history of CABG.  {FINDINGS; POSITIVE NEGATIVE:[id number]} history of diabetes mellitus. Last A1c ***. {FINDINGS; POSITIVE NEGATIVE:[id number]} history of smoking. *** actively smoking. {FINDINGS; POSITIVE NEGATIVE:[id number]} history of hypertension. *** drug regimen with *** control. {FINDINGS; POSITIVE NEGATIVE:[id number]} history of chronic kidney disease.  Last GFR ***. CKD {stage:30421363}. {FINDINGS; POSITIVE NEGATIVE:[phone]} history of chronic obstructive pulmonary disease, treated with ***.  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   GERD (gastroesophageal reflux disease)    H/O: rheumatic fever    age 13   Hearing loss    History of ulcer disease 1970's   Hypertension    Incontinence of urine    Restless leg syndrome    RSD (reflex sympathetic dystrophy)    L hand    Past Surgical History:  Procedure Laterality Date   BLADDER SURGERY     transitional cell carcinoma   carpall tunnel     L hand   CHOLECYSTECTOMY  1979   FOOT SURGERY     x5   JOINT REPLACEMENT  replacements x3   l knee with multiple surg on same knee   NASAL SINUS SURGERY   1990's   PROSTATE BIOPSY     TOTAL KNEE REVISION  04/23/2012   Procedure: TOTAL KNEE REVISION;  Surgeon: Johnny Bridge, MD;  Location: [facility];  Service: Orthopedics;  Laterality: Left;   TRANSURETHRAL RESECTION OF PROSTATE      Family History  Problem Relation Age of Onset   Cancer Father    Melanoma Father    Heart disease Sister    Cancer Other    Melanoma Other    High blood pressure Other     Social History   Socioeconomic History   Marital status: Married    Spouse name: Inez Catalina   Number of children: 1   Years of education: college   Highest education level: Not on file  Occupational History    Employer: [employer]  Tobacco Use   Smoking status: Never   Smokeless tobacco: Never  Substance and Sexual Activity   Alcohol use: No   Drug use: No   Sexual activity: Not on file  Other Topics Concern   Not on file  Social History Narrative   Patient is married and lives with his wife([name]).   Patient has a degree from a divinity school.   Patient is a retired Company secretary.   Patient has one child.   Patient drinks some tea and diet soda occasionally.   Patient is right handed.   Social Determinants of Health   Financial Resource Strain: Not on file  Food Insecurity: Not on file  Transportation Needs: Not on file  Physical Activity: Not on  file  Stress: Not on file  Social Connections: Not on file  Intimate Partner Violence: Not on file    Allergies  Allergen Reactions   Penicillins Rash    Current Outpatient Medications  Medication Sig Dispense Refill   aspirin EC 81 MG tablet Take 81 mg by mouth daily.     benzonatate (TESSALON) 100 MG capsule Take 1 capsule (100 mg total) by mouth every 8 (eight) hours. 21 capsule 0   buPROPion (WELLBUTRIN XL) 150 MG 24 hr tablet      fluticasone (FLONASE) 50 MCG/ACT nasal spray Place 1 spray into both nostrils daily for 14 days. 16 g 0   furosemide (LASIX) 20 MG tablet Take 20 mg by mouth.     gabapentin  (NEURONTIN) 300 MG capsule TAKE 1 CAPSULE BY MOUTH IN THE MORNING, ONE CAPSULE AT NOON AND TWO CAPSULES AT NIGHT 360 capsule 0   metoprolol succinate (TOPROL-XL) 100 MG 24 hr tablet Take 100 mg by mouth daily.      Multiple Vitamin (MULTIVITAMIN WITH MINERALS) TABS Take 1 tablet by mouth daily.     nitrofurantoin (MACRODANTIN) 100 MG capsule Take 100 mg by mouth at bedtime.     pramipexole (MIRAPEX) 1 MG tablet TAKE 1 TABLET BY MOUTH TWICE DURING THE DAY AND ONE AND ONE-HALF TABLETS AT NIGHT 315 tablet 1   quinapril (ACCUPRIL) 40 MG tablet Take 40 mg by mouth every morning.     ranitidine (ZANTAC) 150 MG tablet Take 150 mg by mouth daily.     sertraline (ZOLOFT) 100 MG tablet Take 200 mg by mouth daily.      sertraline (ZOLOFT) 100 MG tablet Take 200 mg by mouth daily.     traMADol (ULTRAM) 50 MG tablet Take 50 mg by mouth every 6 (six) hours as needed for moderate pain.     No current facility-administered medications for this visit.    REVIEW OF SYSTEMS:  [X]  denotes positive finding, [ ]  denotes negative finding Cardiac  Comments:  Chest pain or chest pressure: ***   Shortness of breath upon exertion:    Short of breath when lying flat:    Irregular heart rhythm:        Vascular    Pain in calf, thigh, or hip brought on by ambulation:    Pain in feet at night that wakes you up from your sleep:     Blood clot in your veins:    Leg swelling:         Pulmonary    Oxygen at home:    Productive cough:     Wheezing:         Neurologic    Sudden weakness in arms or legs:     Sudden numbness in arms or legs:     Sudden onset of difficulty speaking or slurred speech:    Temporary loss of vision in one eye:     Problems with dizziness:         Gastrointestinal    Blood in stool:     Vomited blood:         Genitourinary    Burning when urinating:     Blood in urine:        Psychiatric    Major depression:         Hematologic    Bleeding problems:    Problems with blood  clotting too easily:        Skin    Rashes or ulcers:  Constitutional    Fever or chills:      PHYSICAL EXAM There were no vitals filed for this visit.  Constitutional: *** appearing. *** distress. Appears *** nourished.  Neurologic: CN ***. *** focal findings. *** sensory loss. Psychiatric: *** Mood and affect symmetric and appropriate. Eyes: *** No icterus. No conjunctival pallor. Ears, nose, throat: *** mucous membranes moist. Midline trachea.  Cardiac: *** rate and rhythm.  Respiratory: *** unlabored. Abdominal: *** soft, non-tender, non-distended.  Peripheral vascular: *** Extremity: *** edema. *** cyanosis. *** pallor.  Skin: *** gangrene. *** ulceration.  Lymphatic: *** Stemmer's sign. *** palpable lymphadenopathy.  PERTINENT LABORATORY AND RADIOLOGIC DATA  Most recent CBC CBC Latest Ref Rng & Units 04/26/2012 04/25/2012 04/24/2012  WBC 4.0 - 10.5 K/uL 10.2 13.3(H) 15.0(H)  Hemoglobin 13.0 - 17.0 g/dL 11.3(L) 11.1(L) 12.1(L)  Hematocrit 39.0 - 52.0 % 35.2(L) 34.3(L) 36.6(L)  Platelets 150 - 400 K/uL 269 271 284     Most recent CMP CMP Latest Ref Rng & Units 04/24/2012 04/19/2012 04/05/2012  Glucose 70 - 99 mg/dL 130(H) 101(H) 102(H)  BUN 6 - 23 mg/dL 16 20 16   Creatinine 0.50 - 1.35 mg/dL 0.72 0.72 0.93  Sodium 135 - 145 mEq/L 132(L) 135 135(L)  Potassium 3.5 - 5.1 mEq/L 4.5 4.6 4.5  Chloride 96 - 112 mEq/L 99 97 100  CO2 19 - 32 mEq/L 27 31 30   Calcium 8.4 - 10.5 mg/dL 8.5 9.6 9.7  Total Protein 6.4 - 8.2 g/dL - - 8.3(H)  Total Bilirubin 0.2 - 1.0 mg/dL - - 0.5  Alkaline Phos 50 - 136 Unit/L - - 87  AST 15 - 37 Unit/L - - 28  ALT 12 - 78 U/L - - 36    Renal function CrCl cannot be calculated (Patient's most recent lab result is older than the maximum 21 days allowed.).  No results found for: HGBA1C  No results found for: LDLCALC, LDLC, HIRISKLDL, POCLDL, LDLDIRECT, REALLDLC, TOTLDLC   Vascular Imaging: Outside ABI December 2022  Right ABI:  0.92    Left ABI:  0.83    Outside venous duplex:  No evidence of deep venous thrombosis in either lower extremity.   Yevonne Aline. Stanford Breed, MD [facility] Phone Number: [phone] 08/22/2021 7:40 PM  Total time spent on preparing this encounter including chart review, data review, collecting history, examining the patient, coordinating care for this {tnhtimebilling:26202}  Portions of this report may have been transcribed using voice recognition software.  Every effort has been made to ensure accuracy; however, inadvertent computerized transcription errors may still be present.

## 2021-08-23 ENCOUNTER — Encounter: Payer: Medicare HMO | Admitting: Vascular Surgery

## 2021-09-21 ENCOUNTER — Encounter: Payer: Medicare HMO | Admitting: Vascular Surgery

## 2021-11-22 ENCOUNTER — Encounter: Payer: Self-pay | Admitting: Emergency Medicine

## 2021-11-22 ENCOUNTER — Ambulatory Visit
Admission: EM | Admit: 2021-11-22 | Discharge: 2021-11-22 | Disposition: A | Discharge status: Home / Self Care | Payer: Medicare HMO

## 2021-11-22 DIAGNOSIS — Z8551 Personal history of malignant neoplasm of bladder: Secondary | ICD-10-CM

## 2021-11-22 DIAGNOSIS — N3001 Acute cystitis with hematuria: Secondary | ICD-10-CM | POA: Diagnosis not present

## 2021-11-22 DIAGNOSIS — Z87440 Personal history of urinary (tract) infections: Secondary | ICD-10-CM

## 2021-11-22 LAB — POCT URINALYSIS DIP (MANUAL ENTRY)
Bilirubin, UA: NEGATIVE
Glucose, UA: NEGATIVE mg/dL
Nitrite, UA: NEGATIVE
Protein Ur, POC: 100 mg/dL — AB
Spec Grav, UA: >=1.03 — AB (ref 1.010–1.025)
Urobilinogen, UA: 1 E.U./dL
pH, UA: 5 (ref 5.0–8.0)

## 2021-11-22 MED ORDER — CEPHALEXIN 500 MG PO CAPS: 500.0000 mg | ORAL_CAPSULE | Freq: Three times a day (TID) | ORAL | 0 refills | Status: DC

## 2021-11-22 NOTE — ED Provider Notes (Signed)
?[place] ? ? ?MRN: [id number] DOB: Sep 22, 1945 ? ?Subjective:  ? ?Garrett Ferguson is a 76 y.o. male presenting for 3 Garrett history of acute onset dysuria, urinary frequency. Has a history of UTI.  Presents very similarly.  Also has a history of pyelonephritis and has previously had to be hospitalized for this.  Has a history of bladder cancer and is followed by his oncologist for this.  Currently is in remission.  Last creatinine was 0.74 from 04/08/2021. ? ?No current facility-administered medications for this encounter. ? ?Current Outpatient Medications:  ?  cholecalciferol (VITAMIN D3) 25 MCG (1000 UNIT) tablet, Take 1,000 Units by mouth daily., Disp: , Rfl:  ?  zinc gluconate 50 MG tablet, Take 50 mg by mouth daily., Disp: , Rfl:  ?  aspirin EC 81 MG tablet, Take 81 mg by mouth daily., Disp: , Rfl:  ?  benzonatate (TESSALON) 100 MG capsule, Take 1 capsule (100 mg total) by mouth every 8 (eight) hours., Disp: 21 capsule, Rfl: 0 ?  buPROPion (WELLBUTRIN XL) 150 MG 24 hr tablet, , Disp: , Rfl:  ?  fluticasone (FLONASE) 50 MCG/ACT nasal spray, Place 1 spray into both nostrils daily for 14 days., Disp: 16 g, Rfl: 0 ?  furosemide (LASIX) 20 MG tablet, Take 20 mg by mouth., Disp: , Rfl:  ?  gabapentin (NEURONTIN) 300 MG capsule, TAKE 1 CAPSULE BY MOUTH IN THE MORNING, ONE CAPSULE AT NOON AND TWO CAPSULES AT NIGHT, Disp: 360 capsule, Rfl: 0 ?  metoprolol succinate (TOPROL-XL) 100 MG 24 hr tablet, Take 100 mg by mouth daily. , Disp: , Rfl:  ?  Multiple Vitamin (MULTIVITAMIN WITH MINERALS) TABS, Take 1 tablet by mouth daily., Disp: , Rfl:  ?  nitrofurantoin (MACRODANTIN) 100 MG capsule, Take 100 mg by mouth at bedtime., Disp: , Rfl:  ?  pramipexole (MIRAPEX) 1 MG tablet, TAKE 1 TABLET BY MOUTH TWICE DURING THE Garrett AND ONE AND ONE-HALF TABLETS AT NIGHT, Disp: 315 tablet, Rfl: 1 ?  quinapril (ACCUPRIL) 40 MG tablet, Take 40 mg by mouth every morning., Disp: , Rfl:  ?  ranitidine (ZANTAC) 150 MG tablet,  Take 150 mg by mouth daily., Disp: , Rfl:  ?  sertraline (ZOLOFT) 100 MG tablet, Take 200 mg by mouth daily. , Disp: , Rfl:  ?  sertraline (ZOLOFT) 100 MG tablet, Take 200 mg by mouth daily., Disp: , Rfl:  ?  traMADol (ULTRAM) 50 MG tablet, Take 50 mg by mouth every 6 (six) hours as needed for moderate pain., Disp: , Rfl:   ? ?Allergies  ?Allergen Reactions  ? Penicillins Rash  ? ? ?Past Medical History:  ?Diagnosis Date  ? Arthritis   ? Baker's cyst, ruptured   ? Cancer [facility])   ? bladder cancer  ? Chronic kidney disease   ? recent UTI / HX bladder cancer  ? Dysrhythmia   ? saw cardiologist 2006 for rt bundle branch block - has not seen since  ? 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 GERD (gastroesophageal reflux disease)   ? H/O: rheumatic fever   ? age 6  ? Hearing loss   ? History of ulcer disease 1970's  ? Hypertension   ? Incontinence of urine   ? Restless leg syndrome   ? RSD (reflex sympathetic dystrophy)   ? L hand  ?  ? ?Past Surgical History:  ?Procedure Laterality Date  ? BLADDER SURGERY    ? transitional cell carcinoma  ? carpall  tunnel    ? L hand  ? CHOLECYSTECTOMY  1979  ? FOOT SURGERY    ? x5  ? JOINT REPLACEMENT  replacements x3  ? l knee with multiple surg on same knee  ? NASAL SINUS SURGERY  1990's  ? PROSTATE BIOPSY    ? TOTAL KNEE REVISION  04/23/2012  ? Procedure: TOTAL KNEE REVISION;  Surgeon: Johnny Bridge, MD;  Location: [facility];  Service: Orthopedics;  Laterality: Left;  ? TRANSURETHRAL RESECTION OF PROSTATE    ? ? ?Family History  ?Problem Relation Age of Onset  ? Cancer Father   ? Melanoma Father   ? Heart disease Sister   ? Cancer Other   ? Melanoma Other   ? High blood pressure Other   ? ? ?Social History  ? ?Tobacco Use  ? Smoking status: Never  ? Smokeless tobacco: Never  ?Substance Use Topics  ? Alcohol use: No  ? Drug use: No  ? ? ?ROS ? ? ?Objective:  ? ?Vitals: ?BP 135/76 (BP Location: Right Arm)   Pulse (!) 56   Temp (!) 97.4 ?F (36.3 ?C) (Oral)   Resp 18    SpO2 95%  ? ?Physical Exam ?Constitutional:   ?   General: He is not in acute distress. ?   Appearance: Normal appearance. He is well-developed and normal weight. He is not ill-appearing, toxic-appearing or diaphoretic.  ?HENT:  ?   Head: Normocephalic and atraumatic.  ?   Right Ear: External ear normal.  ?   Left Ear: External ear normal.  ?   Nose: Nose normal.  ?   Mouth/Throat:  ?   Pharynx: Oropharynx is clear.  ?Eyes:  ?   General: No scleral icterus.    ?   Right eye: No discharge.     ?   Left eye: No discharge.  ?   Extraocular Movements: Extraocular movements intact.  ?Cardiovascular:  ?   Rate and Rhythm: Normal rate.  ?Pulmonary:  ?   Effort: Pulmonary effort is normal.  ?Abdominal:  ?   General: Bowel sounds are normal. There is no distension.  ?   Palpations: Abdomen is soft. There is no mass.  ?   Tenderness: There is no abdominal tenderness. There is no right CVA tenderness, left CVA tenderness, guarding or rebound.  ?Musculoskeletal:  ?   Cervical back: Normal range of motion.  ?Neurological:  ?   Mental Status: He is alert and oriented to person, place, and time.  ?Psychiatric:     ?   Mood and Affect: Mood normal.     ?   Behavior: Behavior normal.     ?   Thought Content: Thought content normal.     ?   Judgment: Judgment normal.  ? ? ?Results for orders placed or performed during the hospital encounter of 11/22/21 (from the past 24 hour(s))  ?POCT urinalysis dipstick     Status: Abnormal  ? Collection Time: 11/22/21  4:19 PM  ?Result Value Ref Range  ? Color, UA yellow yellow  ? Clarity, UA cloudy (A) clear  ? Glucose, UA negative negative mg/dL  ? Bilirubin, UA negative negative  ? Ketones, POC UA trace (5) (A) negative mg/dL  ? Spec Grav, UA >=1.030 (A) 1.010 - 1.025  ? Blood, UA moderate (A) negative  ? pH, UA 5.0 5.0 - 8.0  ? Protein Ur, POC =100 (A) negative mg/dL  ? Urobilinogen, UA 1.0 0.2 or 1.0 E.U./dL  ? Nitrite,  UA Negative Negative  ? Leukocytes, UA Small (1+) (A) Negative   ? ? ?Assessment and Plan :  ? ?PDMP not reviewed this encounter. ? ?1. Acute cystitis with hematuria   ?2. History of UTI   ?3. History of bladder cancer   ? ?Start Keflex to cover for acute cystitis, urine culture pending.  Recommended aggressive hydration, limiting urinary irritants. Counseled patient on potential for adverse effects with medications prescribed/recommended today, ER and return-to-clinic precautions discussed, patient verbalized understanding. ? ?  ?Jaynee Eagles, PA-C ?11/22/21 1642 ? ?

## 2021-11-22 NOTE — ED Triage Notes (Signed)
Burning on urination since Saturday with frequency.   ?

## 2022-04-15 ENCOUNTER — Ambulatory Visit
Admission: EM | Admit: 2022-04-15 | Discharge: 2022-04-15 | Disposition: A | Discharge status: Home / Self Care | Payer: Medicare HMO | Attending: Nurse Practitioner | Admitting: Nurse Practitioner

## 2022-04-15 ENCOUNTER — Encounter: Payer: Self-pay | Admitting: Emergency Medicine

## 2022-04-15 DIAGNOSIS — R8290 Unspecified abnormal findings in urine: Secondary | ICD-10-CM | POA: Diagnosis present

## 2022-04-15 DIAGNOSIS — R197 Diarrhea, unspecified: Secondary | ICD-10-CM | POA: Diagnosis not present

## 2022-04-15 LAB — POCT URINALYSIS DIP (MANUAL ENTRY)
Glucose, UA: NEGATIVE mg/dL
Nitrite, UA: NEGATIVE
Protein Ur, POC: 100 mg/dL — AB
Spec Grav, UA: >=1.03 — AB (ref 1.010–1.025)
Urobilinogen, UA: 0.2 E.U./dL
pH, UA: 5.5 (ref 5.0–8.0)

## 2022-04-15 MED ORDER — NITROFURANTOIN MONOHYD MACRO 100 MG PO CAPS: 100.0000 mg | ORAL_CAPSULE | Freq: Two times a day (BID) | ORAL | 0 refills | Status: DC

## 2022-04-15 NOTE — ED Triage Notes (Signed)
Diarrhea since Wednesday.   states she has not had diarrhea since last night.

## 2022-04-15 NOTE — ED Provider Notes (Signed)
[facility]    CSN: [id number] Arrival date & time: 04/15/22  1513      History   Chief Complaint No chief complaint on file.   HPI Garrett Ferguson is a 76 y.o. male.   The history is provided by the patient.   Patient presents for complaints of diarrhea that been present for the past 3 days.  Patient states symptoms started late Wednesday night.  He states that he does not recall having eaten anything different or abnormal.  He states since his symptoms started he has been having 2-3 diarrhea episodes per day.  He denies fever, chills, abdominal pain, nausea, vomiting, constipation, or urinary symptoms.  Patient states that he started taking Imodium 1 day ago.  He states his last diarrhea episode was last evening.  He has not had any episodes since he started the Imodium.  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   GERD (gastroesophageal reflux disease)    H/O: rheumatic fever    age 67   Hearing loss    History of ulcer disease 1970's   Hypertension    Incontinence of urine    Restless leg syndrome    RSD (reflex sympathetic dystrophy)    L hand    Patient Active Problem List   Diagnosis Date Noted   Restless legs syndrome (RLS) 08/08/2013   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    Past Surgical History:  Procedure Laterality Date   BLADDER SURGERY     transitional cell carcinoma   carpall tunnel     L hand   CHOLECYSTECTOMY  1979   FOOT SURGERY     x5   JOINT REPLACEMENT  replacements x3   l knee with multiple surg on same knee   NASAL SINUS SURGERY  1990's   PROSTATE BIOPSY     TOTAL KNEE REVISION  04/23/2012   Procedure: TOTAL KNEE REVISION;  Surgeon: Johnny Bridge, MD;  Location: [facility];  Service: Orthopedics;  Laterality: Left;   TRANSURETHRAL RESECTION OF PROSTATE         Home Medications    Prior to Admission medications   Medication Sig Start Date End Date Taking? Authorizing Provider  nitrofurantoin, macrocrystal-monohydrate, (MACROBID) 100 MG capsule Take 1 capsule (100 mg total) by mouth 2 (two) times daily. 04/15/22  Yes Dorella Laster-Warren, Alda Lea, NP  aspirin EC 81 MG tablet Take 81 mg by mouth daily.    [provider]  benzonatate (TESSALON) 100 MG capsule Take 1 capsule (100 mg total) by mouth every 8 (eight) hours. 08/16/19   Avegno, Darrelyn Hillock, FNP  buPROPion (WELLBUTRIN XL) 150 MG 24 hr tablet  10/13/16   [provider]  cephALEXin (KEFLEX) 500 MG capsule Take 1 capsule (500 mg total) by mouth 3 (three) times daily. 11/22/21   Jaynee Eagles, PA-C  cholecalciferol (VITAMIN D3) 25 MCG (1000 UNIT) tablet Take 1,000 Units by mouth daily.    [provider]  fluticasone (FLONASE) 50 MCG/ACT nasal spray Place 1 spray into both nostrils daily for 14 days. 08/16/19 08/30/19  Avegno, Darrelyn Hillock, FNP  furosemide (LASIX) 20 MG  tablet Take 20 mg by mouth. 08/18/16   [provider]  gabapentin (NEURONTIN) 300 MG capsule TAKE 1 CAPSULE BY MOUTH IN THE MORNING, ONE CAPSULE AT [facility] AND TWO CAPSULES AT NIGHT 05/24/17   Dennie Bible, NP  metoprolol succinate (TOPROL-XL) 100 MG 24 hr tablet Take 100 mg by mouth daily.  03/30/14   [provider]  Multiple Vitamin (MULTIVITAMIN WITH MINERALS) TABS Take 1 tablet by mouth daily.    [provider]  nitrofurantoin (MACRODANTIN) 100 MG capsule Take 100 mg by mouth at bedtime.    [provider]  pramipexole (MIRAPEX) 1 MG tablet TAKE 1 TABLET BY MOUTH TWICE DURING THE DAY AND ONE AND ONE-HALF TABLETS AT NIGHT 10/18/16   Dennie Bible, NP  quinapril (ACCUPRIL) 40 MG tablet Take 40 mg by mouth every morning.    [provider]   ranitidine (ZANTAC) 150 MG tablet Take 150 mg by mouth daily.    [provider]  sertraline (ZOLOFT) 100 MG tablet Take 200 mg by mouth daily.  06/10/14 06/10/15  [provider]  sertraline (ZOLOFT) 100 MG tablet Take 200 mg by mouth daily.    [provider]  traMADol (ULTRAM) 50 MG tablet Take 50 mg by mouth every 6 (six) hours as needed for moderate pain.    [provider]  zinc gluconate 50 MG tablet Take 50 mg by mouth daily.    [provider]    Family History Family History  Problem Relation Age of Onset   Cancer Father    Melanoma Father    Heart disease Sister    Cancer Other    Melanoma Other    High blood pressure Other     Social History Social History   Tobacco Use   Smoking status: Never   Smokeless tobacco: Never  Substance Use Topics   Alcohol use: No   Drug use: No     Allergies   Penicillins   Review of Systems Review of Systems Per HPI  Physical Exam Triage Vital Signs ED Triage Vitals  Enc Vitals Group     BP 04/15/22 1538 (!) 120/46     Pulse Rate 04/15/22 1538 70     Resp 04/15/22 1538 18     Temp 04/15/22 1538 98 F (36.7 C)     Temp Source 04/15/22 1538 Oral     SpO2 04/15/22 1538 95 %     Weight --      Height --      Head Circumference --      Peak Flow --      Pain Score 04/15/22 1540 0     Pain Loc --      Pain Edu? --      Excl. in [place]? --    No data found.  Updated Vital Signs BP (!) 120/46 (BP Location: Right Arm)   Pulse 70   Temp 98 F (36.7 C) (Oral)   Resp 18   SpO2 95%   Visual Acuity Right Eye Distance:   Left Eye Distance:   Bilateral Distance:    Right Eye Near:   Left Eye Near:    Bilateral Near:     Physical Exam Vitals and nursing note reviewed.  Constitutional:      General: He is not in acute distress.    Appearance: He is well-developed.  HENT:     Head: Normocephalic and atraumatic.     Nose: Nose normal.  Mouth/Throat:     Mouth:  Mucous membranes are moist.  Eyes:     Extraocular Movements: Extraocular movements intact.     Conjunctiva/sclera: Conjunctivae normal.     Pupils: Pupils are equal, round, and reactive to light.  Cardiovascular:     Rate and Rhythm: Normal rate and regular rhythm.     Pulses: Normal pulses.     Heart sounds: Normal heart sounds. No murmur heard. Pulmonary:     Effort: Pulmonary effort is normal. No respiratory distress.     Breath sounds: Normal breath sounds.  Abdominal:     General: There is no distension.     Palpations: Abdomen is soft.     Tenderness: There is no abdominal tenderness. There is no right CVA tenderness, left CVA tenderness, guarding or rebound.  Musculoskeletal:        General: No swelling.     Cervical back: Normal range of motion.  Lymphadenopathy:     Cervical: No cervical adenopathy.  Skin:    General: Skin is warm and dry.     Capillary Refill: Capillary refill takes less than 2 seconds.  Neurological:     General: No focal deficit present.     Mental Status: He is alert and oriented to person, place, and time.  Psychiatric:        Mood and Affect: Mood normal.        Behavior: Behavior normal.      UC Treatments / Results  Labs (all labs ordered are listed, but only abnormal results are displayed) Labs Reviewed  POCT URINALYSIS DIP (MANUAL ENTRY) - Abnormal; Notable for the following components:      Result Value   Color, UA straw (*)    Clarity, UA cloudy (*)    Bilirubin, UA small (*)    Ketones, POC UA trace (5) (*)    Spec Grav, UA >=1.030 (*)    Blood, UA moderate (*)    Protein Ur, POC =100 (*)    Leukocytes, UA Small (1+) (*)    All other components within normal limits  URINE CULTURE    EKG   Radiology No results found.  Procedures Procedures (including critical care time)  Medications Ordered in UC Medications - No data to display  Initial Impression / Assessment and Plan / UC Course  I have reviewed the triage  vital signs and the nursing notes.  Pertinent labs & imaging results that were available during my care of the patient were reviewed by me and considered in my medical decision making (see chart for details).  Patient presents for complaints of diarrhea that have been present for the past 3 days.  On exam, patient's vitals are mildly stable, he is in no acute distress.  Urinalysis indicates concern for possible urinary tract infection, but this could be likely due to the sample collection.  He does have elevated specific gravity could indicate possible dehydration.  Patient was started on Macrobid for possible urinary tract infection.  Patient was encouraged to increase his fluid intake to prevent dehydration.  Urine culture is pending at this time.  Symptoms have seemed to improve with Imodium, advised patient to continue medication as directed.  Patient was advised to follow-up with his primary care physician if symptoms fail to improve.  Patient was given strict indications of when to go to the emergency department.  Patient verbalizes understanding.  All questions were answered. Final Clinical Impressions(s) / UC Diagnoses   Final diagnoses:  Diarrhea, unspecified  type  Abnormal urinalysis     Discharge Instructions      Your urinalysis suggest that you may have a urinary tract infection.  A urine culture is pending to confirm.  I am starting you on an antibiotic today.  If the culture is positive and the medication needs to be changed, you will be contacted.  You may also be contacted if the urine culture is negative and the medication needs to be stopped. Continue over-the-counter Imodium as directed. Increase your water intake.  Because you are having diarrhea, you are at risk for dehydration.  Try to drink at least 5-6 8 ounce glasses of water daily. Recommend a bland diet or foods they may increase the bulk in your diet until symptoms improve.  See the information provided for tips. Go  to the emergency department immediately if you develop fever, chills, abdominal pain, nausea, vomiting, or worsening diarrhea or other concerns... Please follow-up with your primary care physician within the next 3 to 5 days if symptoms fail to improve.     ED Prescriptions     Medication Sig Dispense Auth. Provider   nitrofurantoin, macrocrystal-monohydrate, (MACROBID) 100 MG capsule Take 1 capsule (100 mg total) by mouth 2 (two) times daily. 10 capsule Macee Venables-Warren, Alda Lea, NP      PDMP not reviewed this encounter.   Tish Men, NP 04/15/22 1620

## 2022-04-15 NOTE — Discharge Instructions (Addendum)
Your urinalysis suggest that you may have a urinary tract infection.  A urine culture is pending to confirm.  I am starting you on an antibiotic today.  If the culture is positive and the medication needs to be changed, you will be contacted.  You may also be contacted if the urine culture is negative and the medication needs to be stopped. Continue over-the-counter Imodium as directed. Increase your water intake.  Because you are having diarrhea, you are at risk for dehydration.  Try to drink at least 5-6 8 ounce glasses of water daily. Recommend a bland diet or foods they may increase the bulk in your diet until symptoms improve.  See the information provided for tips. Go to the emergency department immediately if you develop fever, chills, abdominal pain, nausea, vomiting, or worsening diarrhea or other concerns... Please follow-up with your primary care physician within the next 3 to 5 days if symptoms fail to improve.

## 2022-04-17 LAB — URINE CULTURE: Culture: >=100000 — AB

## 2022-04-18 ENCOUNTER — Telehealth (HOSPITAL_COMMUNITY): Payer: Self-pay | Admitting: Emergency Medicine

## 2022-04-18 MED ORDER — CEFDINIR 300 MG PO CAPS: 300.0000 mg | ORAL_CAPSULE | Freq: Two times a day (BID) | ORAL | 0 refills | Status: AC

## 2022-06-21 ENCOUNTER — Other Ambulatory Visit: Payer: Self-pay

## 2022-06-21 ENCOUNTER — Ambulatory Visit
Admission: EM | Admit: 2022-06-21 | Discharge: 2022-06-21 | Disposition: A | Discharge status: Home / Self Care | Payer: Medicare HMO | Attending: Family Medicine | Admitting: Family Medicine

## 2022-06-21 ENCOUNTER — Encounter: Payer: Self-pay | Admitting: Emergency Medicine

## 2022-06-21 DIAGNOSIS — N390 Urinary tract infection, site not specified: Secondary | ICD-10-CM | POA: Diagnosis present

## 2022-06-21 DIAGNOSIS — Z8551 Personal history of malignant neoplasm of bladder: Secondary | ICD-10-CM | POA: Diagnosis not present

## 2022-06-21 LAB — POCT URINALYSIS DIP (MANUAL ENTRY)
Glucose, UA: NEGATIVE mg/dL
Ketones, POC UA: NEGATIVE mg/dL
Nitrite, UA: NEGATIVE
Protein Ur, POC: 30 mg/dL — AB
Spec Grav, UA: >=1.03 — AB (ref 1.010–1.025)
Urobilinogen, UA: 1 E.U./dL
pH, UA: 6 (ref 5.0–8.0)

## 2022-06-21 MED ORDER — CIPROFLOXACIN HCL 500 MG PO TABS: 500.0000 mg | ORAL_TABLET | Freq: Two times a day (BID) | ORAL | 0 refills | Status: DC

## 2022-06-21 NOTE — ED Provider Notes (Signed)
[facility]    CSN: [id number] Arrival date & time: 06/21/22  1801      History   Chief Complaint Chief Complaint  Patient presents with   Urinary Frequency    HPI Garrett Ferguson is a 76 y.o. male.   Patient presenting today with 4-day history of urinary frequency, dysuria, hematuria with passing of blood clots.  Denies fever, chills, flank pain, nausea, vomiting.  History of bladder cancer and recurrent UTIs followed by urology at [facility].  States he is scheduled for a cystoscopy next month, called them today and was directed to this urgent care for urine testing.  Not trying anything over-the-counter for symptoms.    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   GERD (gastroesophageal reflux disease)    H/O: rheumatic fever    age 29   Hearing loss    History of ulcer disease 1970's   Hypertension    Incontinence of urine    Restless leg syndrome    RSD (reflex sympathetic dystrophy)    L hand    Patient Active Problem List   Diagnosis Date Noted   Restless legs syndrome (RLS) 08/08/2013   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    Past Surgical History:  Procedure Laterality Date   BLADDER SURGERY     transitional cell carcinoma   carpall tunnel     L hand   CHOLECYSTECTOMY  1979   FOOT SURGERY     x5   JOINT REPLACEMENT  replacements x3   l knee with multiple surg on same knee   NASAL SINUS SURGERY  1990's   PROSTATE BIOPSY     TOTAL KNEE REVISION  04/23/2012   Procedure: TOTAL KNEE REVISION;  Surgeon: Johnny Bridge, MD;  Location: [facility];  Service: Orthopedics;  Laterality: Left;   TRANSURETHRAL RESECTION OF PROSTATE         Home Medications     Prior to Admission medications   Medication Sig Start Date End Date Taking? Authorizing Provider  ciprofloxacin (CIPRO) 500 MG tablet Take 1 tablet (500 mg total) by mouth 2 (two) times daily. 06/21/22  Yes Volney American, PA-C  aspirin EC 81 MG tablet Take 81 mg by mouth daily.    [provider]  benzonatate (TESSALON) 100 MG capsule Take 1 capsule (100 mg total) by mouth every 8 (eight) hours. 08/16/19   Avegno, Darrelyn Hillock, FNP  buPROPion (WELLBUTRIN XL) 150 MG 24 hr tablet  10/13/16   [provider]  cephALEXin (KEFLEX) 500 MG capsule Take 1 capsule (500 mg total) by mouth 3 (three) times daily. 11/22/21   Jaynee Eagles, PA-C  cholecalciferol (VITAMIN D3) 25 MCG (1000 UNIT) tablet Take 1,000 Units by mouth daily.    [provider]  fluticasone (FLONASE) 50 MCG/ACT nasal spray Place 1 spray into both nostrils daily for 14 days. 08/16/19 08/30/19  Avegno, Darrelyn Hillock, FNP  furosemide (LASIX) 20 MG tablet Take 20 mg by mouth. 08/18/16   [provider]  gabapentin (NEURONTIN) 300 MG capsule TAKE 1 CAPSULE BY MOUTH IN THE MORNING, ONE CAPSULE AT NOON AND TWO  CAPSULES AT NIGHT 05/24/17   Dennie Bible, NP  metoprolol succinate (TOPROL-XL) 100 MG 24 hr tablet Take 100 mg by mouth daily.  03/30/14   [provider]  Multiple Vitamin (MULTIVITAMIN WITH MINERALS) TABS Take 1 tablet by mouth daily.    [provider]  nitrofurantoin (MACRODANTIN) 100 MG capsule Take 100 mg by mouth at bedtime.    [provider]  pramipexole (MIRAPEX) 1 MG tablet TAKE 1 TABLET BY MOUTH TWICE DURING THE DAY AND ONE AND ONE-HALF TABLETS AT NIGHT 10/18/16   Dennie Bible, NP  quinapril (ACCUPRIL) 40 MG tablet Take 40 mg by mouth every morning.    [provider]  ranitidine (ZANTAC) 150 MG tablet Take 150 mg by mouth daily.    [provider]  sertraline (ZOLOFT) 100 MG tablet Take 200 mg by mouth daily.  06/10/14 06/10/15   [provider]  sertraline (ZOLOFT) 100 MG tablet Take 200 mg by mouth daily.    [provider]  traMADol (ULTRAM) 50 MG tablet Take 50 mg by mouth every 6 (six) hours as needed for moderate pain.    [provider]  zinc gluconate 50 MG tablet Take 50 mg by mouth daily.    [provider]    Family History Family History  Problem Relation Age of Onset   Cancer Father    Melanoma Father    Heart disease Sister    Cancer Other    Melanoma Other    High blood pressure Other     Social History Social History   Tobacco Use   Smoking status: Never   Smokeless tobacco: Never  Substance Use Topics   Alcohol use: No   Drug use: No     Allergies   Penicillins   Review of Systems Review of Systems HPI  Physical Exam Triage Vital Signs ED Triage Vitals [06/21/22 1853]  Enc Vitals Group     BP (!) 177/73     Pulse Rate (!) 57     Resp (!) 22     Temp (!) 97.3 F (36.3 C)     Temp Source Oral     SpO2 98 %     Weight      Height      Head Circumference      Peak Flow      Pain Score 0     Pain Loc      Pain Edu?      Excl. in [place]?    No data found.  Updated Vital Signs BP (!) 177/73 (BP Location: Right Arm)   Pulse (!) 57   Temp (!) 97.3 F (36.3 C) (Oral)   Resp (!) 22   SpO2 98%   Visual Acuity Right Eye Distance:   Left Eye Distance:   Bilateral Distance:    Right Eye Near:   Left Eye Near:    Bilateral Near:     Physical Exam Vitals and nursing note reviewed.  Constitutional:      Appearance: Normal appearance.  HENT:     Head: Atraumatic.     Mouth/Throat:     Mouth: Mucous membranes are moist.  Eyes:     Extraocular Movements: Extraocular movements intact.     Conjunctiva/sclera: Conjunctivae normal.  Cardiovascular:     Rate and Rhythm: Normal rate and regular rhythm.  Pulmonary:     Effort: Pulmonary effort is normal.     Breath sounds: Normal breath sounds.  Abdominal:  General: Bowel  sounds are normal. There is no distension.     Palpations: Abdomen is soft.     Tenderness: There is no abdominal tenderness. There is no right CVA tenderness, left CVA tenderness or guarding.  Musculoskeletal:     Cervical back: Normal range of motion and neck supple.     Comments: Range of motion at baseline  Skin:    General: Skin is warm and dry.  Neurological:     Mental Status: He is oriented to person, place, and time.  Psychiatric:        Mood and Affect: Mood normal.        Thought Content: Thought content normal.        Judgment: Judgment normal.    UC Treatments / Results  Labs (all labs ordered are listed, but only abnormal results are displayed) Labs Reviewed  POCT URINALYSIS DIP (MANUAL ENTRY) - Abnormal; Notable for the following components:      Result Value   Bilirubin, UA small (*)    Spec Grav, UA >=1.030 (*)    Blood, UA trace-intact (*)    Protein Ur, POC =30 (*)    Leukocytes, UA Large (3+) (*)    All other components within normal limits  URINE CULTURE    EKG   Radiology No results found.  Procedures Procedures (including critical care time)  Medications Ordered in UC Medications - No data to display  Initial Impression / Assessment and Plan / UC Course  I have reviewed the triage vital signs and the nursing notes.  Pertinent labs & imaging results that were available during my care of the patient were reviewed by me and considered in my medical decision making (see chart for details).     Urinalysis today showing evidence of a urinary tract infection.  Urine culture pending, treat with Cipro while awaiting results and follow-up closely with urology for reevaluation.  ED for worsening symptoms.  Final Clinical Impressions(s) / UC Diagnoses   Final diagnoses:  Acute lower UTI  History of bladder cancer   Discharge Instructions   None    ED Prescriptions     Medication Sig Dispense Auth. Provider   ciprofloxacin (CIPRO) 500 MG  tablet Take 1 tablet (500 mg total) by mouth 2 (two) times daily. 14 tablet Volney American, [place]      PDMP not reviewed this encounter.   Volney American, [place] 06/21/22 1927

## 2022-06-21 NOTE — ED Triage Notes (Addendum)
Pt reports urinary frequency and dysuria since 2022-11-08. Has passed several blood clots at home.   Pt reports history of bladder cancer and several UTI in the past. Pt reports is scheduled for cystoscopy in December at [facility].  Pt reports contacted pcp and urologist and was told to have urine evaluated at [facility].

## 2022-06-22 LAB — URINE CULTURE: Culture: <10000 — AB

## 2022-10-15 ENCOUNTER — Other Ambulatory Visit: Payer: Self-pay

## 2022-10-15 ENCOUNTER — Encounter (HOSPITAL_COMMUNITY): Payer: Self-pay

## 2022-10-15 ENCOUNTER — Emergency Department (HOSPITAL_COMMUNITY): Payer: Medicare HMO

## 2022-10-15 ENCOUNTER — Emergency Department (HOSPITAL_COMMUNITY)
Admission: EM | Admit: 2022-10-15 | Discharge: 2022-10-15 | Disposition: A | Discharge status: Home / Self Care | Payer: Medicare HMO | Attending: Emergency Medicine | Admitting: Emergency Medicine

## 2022-10-15 DIAGNOSIS — R600 Localized edema: Secondary | ICD-10-CM | POA: Insufficient documentation

## 2022-10-15 DIAGNOSIS — S0101XA Laceration without foreign body of scalp, initial encounter: Secondary | ICD-10-CM | POA: Insufficient documentation

## 2022-10-15 DIAGNOSIS — Y93E1 Activity, personal bathing and showering: Secondary | ICD-10-CM | POA: Diagnosis not present

## 2022-10-15 DIAGNOSIS — Y92002 Bathroom of unspecified non-institutional (private) residence single-family (private) house as the place of occurrence of the external cause: Secondary | ICD-10-CM | POA: Insufficient documentation

## 2022-10-15 DIAGNOSIS — Z79899 Other long term (current) drug therapy: Secondary | ICD-10-CM | POA: Diagnosis not present

## 2022-10-15 DIAGNOSIS — R000 Tachycardia, unspecified: Secondary | ICD-10-CM | POA: Diagnosis not present

## 2022-10-15 DIAGNOSIS — W182XXA Fall in (into) shower or empty bathtub, initial encounter: Secondary | ICD-10-CM | POA: Diagnosis not present

## 2022-10-15 DIAGNOSIS — I10 Essential (primary) hypertension: Secondary | ICD-10-CM | POA: Diagnosis not present

## 2022-10-15 DIAGNOSIS — Z7982 Long term (current) use of aspirin: Secondary | ICD-10-CM | POA: Diagnosis not present

## 2022-10-15 DIAGNOSIS — W19XXXA Unspecified fall, initial encounter: Secondary | ICD-10-CM

## 2022-10-15 LAB — CBC WITH DIFFERENTIAL/PLATELET
Abs Immature Granulocytes: 0.03 10*3/uL (ref 0.00–0.07)
Basophils Absolute: 0 10*3/uL (ref 0.0–0.1)
Basophils Relative: 0 %
Eosinophils Absolute: 0.2 10*3/uL (ref 0.0–0.5)
Eosinophils Relative: 2 %
HCT: 44 % (ref 39.0–52.0)
Hemoglobin: 14.2 g/dL (ref 13.0–17.0)
Immature Granulocytes: 0 %
Lymphocytes Relative: 14 %
Lymphs Abs: 1.3 10*3/uL (ref 0.7–4.0)
MCH: 29.9 pg (ref 26.0–34.0)
MCHC: 32.3 g/dL (ref 30.0–36.0)
MCV: 92.6 fL (ref 80.0–100.0)
Monocytes Absolute: 0.7 10*3/uL (ref 0.1–1.0)
Monocytes Relative: 7 %
Neutro Abs: 7.2 10*3/uL (ref 1.7–7.7)
Neutrophils Relative %: 77 %
Platelets: 248 10*3/uL (ref 150–400)
RBC: 4.75 MIL/uL (ref 4.22–5.81)
RDW: 13.7 % (ref 11.5–15.5)
WBC: 9.5 10*3/uL (ref 4.0–10.5)
nRBC: 0 % (ref 0.0–0.2)

## 2022-10-15 LAB — BASIC METABOLIC PANEL
Anion gap: 10 (ref 5–15)
BUN: 28 mg/dL — ABNORMAL HIGH (ref 8–23)
CO2: 25 mmol/L (ref 22–32)
Calcium: 9 mg/dL (ref 8.9–10.3)
Chloride: 100 mmol/L (ref 98–111)
Creatinine, Ser: 0.79 mg/dL (ref 0.61–1.24)
GFR, Estimated: >60 mL/min (ref >60)
Glucose, Bld: 109 mg/dL — ABNORMAL HIGH (ref 70–99)
Potassium: 4 mmol/L (ref 3.5–5.1)
Sodium: 135 mmol/L (ref 135–145)

## 2022-10-15 MED ORDER — METOPROLOL SUCCINATE ER 50 MG PO TB24
100.0000 mg | ORAL_TABLET | Freq: Once | ORAL | Status: AC
  Administered 2022-10-15: 100 mg via ORAL
  Filled 2022-10-15: qty 2

## 2022-10-15 MED ORDER — PRAMIPEXOLE DIHYDROCHLORIDE 1 MG PO TABS: 1.0000 mg | ORAL_TABLET | Freq: Three times a day (TID) | ORAL | 0 refills | Status: DC

## 2022-10-15 NOTE — ED Triage Notes (Signed)
EMS called out patient fell in shower. No LOC laceration to back of the head. Patient denies blood thinners. VS 194/74, HR 42, 91% RA, 18 R. Patient states he was heading back to bed and got weak but did not faint and hit the shower door with his head.

## 2022-10-15 NOTE — ED Provider Notes (Addendum)
[facility] Provider Note   CSN: [id number] Arrival date & time: 10/15/22  [id number]     History  Chief Complaint  Patient presents with   Garrett Ferguson is a 77 y.o. male.  Patient states that he had messed on himself he was trying to clean up.  He was heading back to bed he got weak and fell striking the back of his head on the shower door glass.  But the glass did not break.  Denies any loss of consciousness.  Denies any neck pain back pain or any hip pain.  Patient states tetanus is up-to-date.  Past medical history significant for restless leg syndrome hypertension right bundle branch block chronic kidney disease history of bladder cancer failed left total knee replacement.  Past surgical history significant for gallbladder removal and 78.  Patient is never used tobacco products.  Patient states that he felt fine all day yesterday.  Has not been feeling ill.       Home Medications Prior to Admission medications   Medication Sig Start Date End Date Taking? Authorizing Provider  aspirin EC 81 MG tablet Take 81 mg by mouth daily.    [provider]  benzonatate (TESSALON) 100 MG capsule Take 1 capsule (100 mg total) by mouth every 8 (eight) hours. 08/16/19   Avegno, Darrelyn Hillock, FNP  buPROPion (WELLBUTRIN XL) 150 MG 24 hr tablet  10/13/16   [provider]  cephALEXin (KEFLEX) 500 MG capsule Take 1 capsule (500 mg total) by mouth 3 (three) times daily. 11/22/21   Jaynee Eagles, PA-C  cholecalciferol (VITAMIN D3) 25 MCG (1000 UNIT) tablet Take 1,000 Units by mouth daily.    [provider]  ciprofloxacin (CIPRO) 500 MG tablet Take 1 tablet (500 mg total) by mouth 2 (two) times daily. 06/21/22   Volney American, PA-C  fluticasone (FLONASE) 50 MCG/ACT nasal spray Place 1 spray into both nostrils daily for 14 days. 08/16/19 08/30/19  Avegno, Darrelyn Hillock, FNP  furosemide (LASIX) 20 MG tablet Take 20 mg by mouth. 08/18/16    [provider]  gabapentin (NEURONTIN) 300 MG capsule TAKE 1 CAPSULE BY MOUTH IN THE MORNING, ONE CAPSULE AT [facility] AND TWO CAPSULES AT NIGHT 05/24/17   Dennie Bible, NP  metoprolol succinate (TOPROL-XL) 100 MG 24 hr tablet Take 100 mg by mouth daily.  03/30/14   [provider]  Multiple Vitamin (MULTIVITAMIN WITH MINERALS) TABS Take 1 tablet by mouth daily.    [provider]  nitrofurantoin (MACRODANTIN) 100 MG capsule Take 100 mg by mouth at bedtime.    [provider]  pramipexole (MIRAPEX) 1 MG tablet TAKE 1 TABLET BY MOUTH TWICE DURING THE DAY AND ONE AND ONE-HALF TABLETS AT NIGHT 10/18/16   Dennie Bible, NP  quinapril (ACCUPRIL) 40 MG tablet Take 40 mg by mouth every morning.    [provider]  ranitidine (ZANTAC) 150 MG tablet Take 150 mg by mouth daily.    [provider]  sertraline (ZOLOFT) 100 MG tablet Take 200 mg by mouth daily.  06/10/14 06/10/15  [provider]  sertraline (ZOLOFT) 100 MG tablet Take 200 mg by mouth daily.    [provider]  traMADol (ULTRAM) 50 MG tablet Take 50 mg by mouth every 6 (six) hours as needed for moderate pain.    [provider]  zinc gluconate 50 MG tablet Take 50 mg by mouth daily.  [provider]      Allergies    Penicillins    Review of Systems   Review of Systems  Constitutional:  Negative for chills and fever.  HENT:  Negative for ear pain and sore throat.   Eyes:  Negative for pain and visual disturbance.  Respiratory:  Negative for cough and shortness of breath.   Cardiovascular:  Positive for leg swelling. Negative for chest pain and palpitations.  Gastrointestinal:  Negative for abdominal pain and vomiting.  Genitourinary:  Negative for dysuria and hematuria.  Musculoskeletal:  Negative for arthralgias and back pain.  Skin:  Negative for color change and rash.  Neurological:  Positive for headaches. Negative for seizures  and syncope.  Hematological:  Does not bruise/bleed easily.  All other systems reviewed and are negative.   Physical Exam Updated Vital Signs BP (!) 204/107   Pulse (!) 116   Temp 97.9 F (36.6 C) (Oral)   Resp 19   Ht 1.803 m ('5\' 11"'$ )   Wt 125.6 kg   SpO2 99%   BMI 38.62 kg/m  Physical Exam Vitals and nursing note reviewed.  Constitutional:      General: He is not in acute distress.    Appearance: Normal appearance. He is well-developed.  HENT:     Head: Normocephalic.     Comments: Top of the head with a superficial laceration measuring about 6 cm.  No active bleeding. Eyes:     Extraocular Movements: Extraocular movements intact.     Conjunctiva/sclera: Conjunctivae normal.     Pupils: Pupils are equal, round, and reactive to light.  Cardiovascular:     Rate and Rhythm: Regular rhythm. Tachycardia present.     Heart sounds: No murmur heard. Pulmonary:     Effort: Pulmonary effort is normal. No respiratory distress.     Breath sounds: Normal breath sounds.  Abdominal:     Palpations: Abdomen is soft.     Tenderness: There is no abdominal tenderness.  Musculoskeletal:        General: No swelling.     Cervical back: Normal range of motion and neck supple. No rigidity or tenderness.     Right lower leg: Edema present.     Left lower leg: Edema present.     Comments: Good movement of all lower and upper extremities.  No concerns for hip injury.  Skin:    General: Skin is warm and dry.     Capillary Refill: Capillary refill takes less than 2 seconds.  Neurological:     General: No focal deficit present.     Mental Status: He is alert and oriented to person, place, and time.  Psychiatric:        Mood and Affect: Mood normal.     ED Results / Procedures / Treatments   Labs (all labs ordered are listed, but only abnormal results are displayed) Labs Reviewed  CBC WITH DIFFERENTIAL/PLATELET  BASIC METABOLIC PANEL    EKG None  Radiology No results  found.  Procedures .Marland KitchenLaceration Repair  Date/Time: 10/15/2022 10:02 AM  Performed by: Fredia Sorrow, MD Authorized by: Fredia Sorrow, MD   Consent:    Consent obtained:  Verbal   Consent given by:  Patient   Risks, benefits, and alternatives were discussed: yes     Risks discussed:  Pain and infection   Alternatives discussed:  No treatment Universal protocol:    Procedure explained and questions answered to patient or proxy's satisfaction: yes     Relevant  documents present and verified: yes     Test results available: yes     Imaging studies available: yes     Immediately prior to procedure, a time out was called: yes     Patient identity confirmed:  Verbally with patient Anesthesia:    Anesthesia method:  None Laceration details:    Location:  Scalp   Length (cm):  4 Exploration:    Limited defect created (wound extended): no     Contaminated: no   Treatment:    Area cleansed with:  Saline   Amount of cleaning:  Standard   Irrigation solution:  Sterile saline   Visualized foreign bodies/material removed: no     Debridement:  None   Undermining:  None   Scar revision: no   Skin repair:    Repair method:  Staples   Number of staples:  3 Repair type:    Repair type:  Simple Post-procedure details:    Dressing:  Antibiotic ointment     Medications Ordered in ED Medications  metoprolol succinate (TOPROL-XL) 24 hr tablet 100 mg (has no administration in time range)    ED Course/ Medical Decision Making/ A&P                             Medical Decision Making Amount and/or Complexity of Data Reviewed Labs: ordered. Radiology: ordered.  Risk Prescription drug management.   Patient states tetanus up-to-date.  Will get head CT and check basic labs CBC basic metabolic panel.  Patient has a longstanding history of right bundle branch block with looking at old EKGs.  But he is tachycardic.  Review of old EKGs show that maybe at 1 time they thought he had  atrial flutter with a 1-1 block.  Patient is not on blood thinners.  Laceration to the top of the head does not appear deep but will reassess.  Graft patient's blood pressure also markedly elevated here he did not get his Toprol XL that he normally takes each morning so we will provide that.  Probably up due to that.  Patient does not appear to be in any distress.  So head CT head important basic labs important rechecking heart rhythm and rechecking blood pressure.  CT head without any acute findings.  CBC basic metabolic panel without any acute findings.  Patient's blood pressure has improved.  But still slightly elevated.  Will need to follow-up with primary regarding that.  Patient heart rate in the low 100s.  Still sinus.  Scalp wound received 4 staples.  Will have the staples removed in 7 to 10 days.   Final Clinical Impression(s) / ED Diagnoses Final diagnoses:  Fall, initial encounter  Laceration of scalp, initial encounter    Rx / DC Orders ED Discharge Orders     None         Fredia Sorrow, MD 10/15/22 [id number]    Fredia Sorrow, MD 10/15/22 1016

## 2022-10-15 NOTE — Discharge Instructions (Addendum)
Keep the scalp wound dry for 24 hours then can wash with soap and water.  Staple removal in the next 7 to 10 days.  Head CT negative labs without significant abnormalities.  Have renewed your restless leg medicine.

## 2023-02-16 ENCOUNTER — Encounter (HOSPITAL_COMMUNITY)
Admission: RE | Admit: 2023-02-16 | Discharge: 2023-02-16 | Disposition: A | Discharge status: Home / Self Care | Payer: Medicare HMO | Source: Ambulatory Visit | Attending: Optometry | Admitting: Optometry

## 2023-02-16 ENCOUNTER — Encounter (HOSPITAL_COMMUNITY): Payer: Self-pay

## 2023-02-16 ENCOUNTER — Other Ambulatory Visit: Payer: Self-pay
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
## 2023-02-23 ENCOUNTER — Ambulatory Visit (HOSPITAL_COMMUNITY)
Admission: RE | Admit: 2023-02-23 | Discharge: 2023-02-23 | Disposition: A | Discharge status: Home / Self Care | Payer: Medicare HMO | Attending: Optometry | Admitting: Optometry

## 2023-02-23 ENCOUNTER — Encounter (HOSPITAL_COMMUNITY): Payer: Self-pay | Admitting: Optometry

## 2023-02-23 ENCOUNTER — Other Ambulatory Visit: Payer: Self-pay

## 2023-02-23 ENCOUNTER — Ambulatory Visit (HOSPITAL_COMMUNITY): Payer: Medicare HMO | Admitting: Anesthesiology

## 2023-02-23 ENCOUNTER — Encounter (HOSPITAL_COMMUNITY)
Admission: RE | Disposition: A | Discharge status: Home / Self Care | Payer: Self-pay | Source: Home / Self Care | Attending: Optometry

## 2023-02-23 DIAGNOSIS — G2581 Restless legs syndrome: Secondary | ICD-10-CM | POA: Diagnosis not present

## 2023-02-23 DIAGNOSIS — I10 Essential (primary) hypertension: Secondary | ICD-10-CM

## 2023-02-23 DIAGNOSIS — Z79899 Other long term (current) drug therapy: Secondary | ICD-10-CM | POA: Insufficient documentation

## 2023-02-23 DIAGNOSIS — K219 Gastro-esophageal reflux disease without esophagitis: Secondary | ICD-10-CM | POA: Insufficient documentation

## 2023-02-23 DIAGNOSIS — M1990 Unspecified osteoarthritis, unspecified site: Secondary | ICD-10-CM | POA: Diagnosis not present

## 2023-02-23 DIAGNOSIS — I4891 Unspecified atrial fibrillation: Secondary | ICD-10-CM | POA: Insufficient documentation

## 2023-02-23 DIAGNOSIS — H2511 Age-related nuclear cataract, right eye: Secondary | ICD-10-CM | POA: Insufficient documentation

## 2023-02-23 DIAGNOSIS — I890 Lymphedema, not elsewhere classified: Secondary | ICD-10-CM | POA: Insufficient documentation

## 2023-02-23 DIAGNOSIS — H47292 Other optic atrophy, left eye: Secondary | ICD-10-CM | POA: Diagnosis not present

## 2023-02-23 DIAGNOSIS — H02403 Unspecified ptosis of bilateral eyelids: Secondary | ICD-10-CM | POA: Insufficient documentation

## 2023-02-23 DIAGNOSIS — H5213 Myopia, bilateral: Secondary | ICD-10-CM | POA: Insufficient documentation

## 2023-02-23 HISTORY — PX: CATARACT EXTRACTION W/PHACO: SHX586

## 2023-02-23 SURGERY — PHACOEMULSIFICATION, CATARACT, WITH IOL INSERTION
Anesthesia: Monitor Anesthesia Care | Site: Eye | Laterality: Right

## 2023-02-23 MED ORDER — SIGHTPATH DOSE#1 NA HYALUR & NA CHOND-NA HYALUR IO KIT
PACK | INTRAOCULAR | Status: DC | PRN
  Administered 2023-02-23: 1 via OPHTHALMIC

## 2023-02-23 MED ORDER — MIDAZOLAM HCL 2 MG/2ML IJ SOLN
INTRAMUSCULAR | Status: AC
  Filled 2023-02-23: qty 2

## 2023-02-23 MED ORDER — MIDAZOLAM HCL 5 MG/5ML IJ SOLN
INTRAMUSCULAR | Status: DC | PRN
  Administered 2023-02-23: 1 mg via INTRAVENOUS

## 2023-02-23 MED ORDER — SODIUM CHLORIDE 0.9% FLUSH
INTRAVENOUS | Status: DC | PRN
  Administered 2023-02-23: 5 mL via INTRAVENOUS

## 2023-02-23 MED ORDER — STERILE WATER FOR IRRIGATION IR SOLN
Status: DC | PRN
  Administered 2023-02-23: 1000 mL

## 2023-02-23 MED ORDER — TRYPAN BLUE 0.06 % IO SOSY
PREFILLED_SYRINGE | INTRAOCULAR | Status: DC | PRN
  Administered 2023-02-23: .5 mL via INTRAOCULAR

## 2023-02-23 MED ORDER — LIDOCAINE HCL (PF) 1 % IJ SOLN
INTRAMUSCULAR | Status: DC | PRN
  Administered 2023-02-23: 1 mL

## 2023-02-23 MED ORDER — TETRACAINE HCL 0.5 % OP SOLN
1.0000 [drp] | OPHTHALMIC | Status: AC
  Administered 2023-02-23 (×3): 1 [drp] via OPHTHALMIC

## 2023-02-23 MED ORDER — POVIDONE-IODINE 5 % OP SOLN
OPHTHALMIC | Status: DC | PRN
  Administered 2023-02-23: 1 via OPHTHALMIC

## 2023-02-23 MED ORDER — TRYPAN BLUE 0.06 % IO SOSY
PREFILLED_SYRINGE | INTRAOCULAR | Status: AC
  Filled 2023-02-23: qty 0.5

## 2023-02-23 MED ORDER — NEOMYCIN-POLYMYXIN-DEXAMETH 3.5-10000-0.1 OP SUSP
OPHTHALMIC | Status: DC | PRN
  Administered 2023-02-23: 2 [drp] via OPHTHALMIC

## 2023-02-23 MED ORDER — PHENYLEPHRINE HCL 2.5 % OP SOLN
1.0000 [drp] | OPHTHALMIC | Status: AC
  Administered 2023-02-23 (×3): 1 [drp] via OPHTHALMIC

## 2023-02-23 MED ORDER — TROPICAMIDE 1 % OP SOLN
1.0000 [drp] | OPHTHALMIC | Status: AC
  Administered 2023-02-23 (×3): 1 [drp] via OPHTHALMIC

## 2023-02-23 MED ORDER — LIDOCAINE HCL 3.5 % OP GEL
1.0000 | Freq: Once | OPHTHALMIC | Status: AC
  Administered 2023-02-23: 1 via OPHTHALMIC

## 2023-02-23 MED ORDER — BSS IO SOLN
INTRAOCULAR | Status: DC | PRN
  Administered 2023-02-23: 15 mL via INTRAOCULAR

## 2023-02-23 MED ORDER — PHENYLEPHRINE-KETOROLAC 1-0.3 % IO SOLN
INTRAOCULAR | Status: DC | PRN
  Administered 2023-02-23: 500 mL via OPHTHALMIC

## 2023-02-23 SURGICAL SUPPLY — 15 items
CATARACT SUITE SIGHTPATH (MISCELLANEOUS) ×1 IMPLANT
CLOTH BEACON ORANGE TIMEOUT ST (SAFETY) ×2 IMPLANT
DRSG TEGADERM 4X4.75 (GAUZE/BANDAGES/DRESSINGS) ×2 IMPLANT
EYE SHIELD UNIVERSAL CLEAR (GAUZE/BANDAGES/DRESSINGS) IMPLANT
FEE CATARACT SUITE SIGHTPATH (MISCELLANEOUS) ×2 IMPLANT
GLOVE BIOGEL PI IND STRL 7.0 (GLOVE) ×4 IMPLANT
LENS IOL TECNIS EYHANCE 15.0 (Intraocular Lens) IMPLANT
NDL HYPO 18GX1.5 BLUNT FILL (NEEDLE) ×2 IMPLANT
NEEDLE HYPO 18GX1.5 BLUNT FILL (NEEDLE) ×1 IMPLANT
PAD ARMBOARD 7.5X6 YLW CONV (MISCELLANEOUS) ×2 IMPLANT
POSITIONER HEAD 8X9X4 ADT (SOFTGOODS) ×2 IMPLANT
RING MALYGIN 7.0 (MISCELLANEOUS) IMPLANT
SYR TB 1ML LL NO SAFETY (SYRINGE) ×2 IMPLANT
TAPE SURG TRANSPORE 1 IN (GAUZE/BANDAGES/DRESSINGS) IMPLANT
WATER STERILE IRR 250ML POUR (IV SOLUTION) ×2 IMPLANT

## 2023-02-23 NOTE — Interval H&P Note (Signed)
History and Physical Interval Note:  02/23/2023 8:32 AM  The H and P was reviewed and updated. The patient was examined.  No changes were found after exam.  The surgical eye was marked.  Garrett Ferguson

## 2023-02-23 NOTE — Discharge Instructions (Signed)
Please discharge patient when stable, will follow up today with Dr. Ezri Landers at the [facility] office immediately following discharge.  Leave shield in place until visit.  All paperwork with discharge instructions will be given at the office.  [facility] Address:  [address]  [place], [place] [address]  Dr. Prisca Gearing's Phone: [phone]  

## 2023-02-23 NOTE — Anesthesia Preprocedure Evaluation (Signed)
Anesthesia Evaluation  Patient identified by MRN, date of birth, ID band Patient awake    Reviewed: Allergy & Precautions, NPO status , Patient's Chart, lab work & pertinent test results, reviewed documented beta blocker date and time   Airway Mallampati: II  TM Distance: >3 FB Neck ROM: Full    Dental  (+) Edentulous Upper, Edentulous Lower   Pulmonary    Pulmonary exam normal breath sounds clear to auscultation       Cardiovascular Exercise Tolerance: Poor hypertension, Pt. on medications and Pt. on home beta blockers Normal cardiovascular exam+ dysrhythmias Atrial Fibrillation  Rhythm:Regular Rate:Normal  15-Oct-2022 07:46:55 [facility] ROUTINE RECORD August 06, 1946 (48 yr) Male Caucasian Vent. rate 119 BPM PR interval 109 ms QRS duration 157 ms QT/QTcB 334/470 ms P-R-T axes 53 6 -20 Sinus tachycardia Ventricular premature complex Right bundle branch block Artifact in lead(s) I III aVL V1 V2 Confirmed by Vanetta Mulders [phone]) on 10/15/2022 8:04:55 AM   Neuro/Psych  Neuromuscular disease (RLS)    GI/Hepatic ,GERD  Medicated and Controlled,,  Endo/Other    Renal/GU Renal disease     Musculoskeletal  (+) Arthritis , Osteoarthritis,    Abdominal   Peds  Hematology   Anesthesia Other Findings Bilateral LE lymphedema   Reproductive/Obstetrics                             Anesthesia Physical Anesthesia Plan  ASA: 3  Anesthesia Plan: MAC   Post-op Pain Management: Minimal or no pain anticipated   Induction: Intravenous  PONV Risk Score and Plan: 1 and Treatment may vary due to age or medical condition  Airway Management Planned: Nasal Cannula and Natural Airway  Additional Equipment:   Intra-op Plan:   Post-operative Plan:   Informed Consent: I have reviewed the patients History and Physical, chart, labs and discussed the procedure including the risks,  benefits and alternatives for the proposed anesthesia with the patient or authorized representative who has indicated his/her understanding and acceptance.     Dental advisory given  Plan Discussed with: CRNA and Surgeon  Anesthesia Plan Comments:         Anesthesia Quick Evaluation

## 2023-02-23 NOTE — Anesthesia Postprocedure Evaluation (Signed)
Anesthesia Post Note  Patient: Garrett Ferguson  Procedure(s) Performed: CATARACT EXTRACTION PHACO AND INTRAOCULAR LENS PLACEMENT (IOC) (Right: Eye)  Patient location during evaluation: Short Stay Anesthesia Type: MAC Level of consciousness: awake and alert Pain management: pain level controlled Vital Signs Assessment: post-procedure vital signs reviewed and stable Respiratory status: spontaneous breathing Cardiovascular status: stable Postop Assessment: no apparent nausea or vomiting Anesthetic complications: no   No notable events documented.   Last Vitals:  Vitals:   02/23/23 0843 02/23/23 0931  BP: (!) 198/88 (!) 172/70  Pulse: 79 76  Resp: 20 (!) 24  Temp: 36.4 C 37.1 C  SpO2: 100% 94%    Last Pain:  Vitals:   02/23/23 0931  TempSrc:   PainSc: 0-No pain                 Maddax Palinkas

## 2023-02-23 NOTE — Op Note (Addendum)
Date of procedure: 02/23/23  Pre-operative diagnosis: Mature Visually significant age-related cataract, Right Eye (H25.21)  Post-operative diagnosis: Mature Visually significant age-related cataract, Right Eye  Procedure: Complex Removal of cataract via phacoemulsification and insertion of intra-ocular lens J&J DIBOO +15.0D  into the capsular bag of the Right Eye  Attending surgeon: Pecolia Ades, MD  Anesthesia: MAC, Topical Akten  Complications: None  Estimated Blood Loss: <63mL (minimal)  Specimens: None  Implants:  Implant Name Type Inv. Item Serial No. Manufacturer Lot No. LRB No. Used Action  LENS IOL TECNIS EYHANCE 15.0 - [id number] Intraocular Lens LENS IOL TECNIS EYHANCE 15.0 [id number] [employer]  Right 1 Implanted    Indications:  Mature Visually significant age-related cataract, Right Eye  Procedure:  The patient was seen and identified in the pre-operative area. The operative eye was identified and dilated.  The operative eye was marked.  Topical anesthesia was administered to the operative eye.     The patient was then to the operative suite and placed in the supine position.  A timeout was performed confirming the patient, procedure to be performed, and all other relevant information.   The patient's face was prepped and draped in the usual fashion for intra-ocular surgery.  A lid speculum was placed into the operative eye and the surgical microscope moved into place and focused.  A lack of red reflex due to a mature cataract was confirmed.  A superotemporal paracentesis was created using a 20 gauge paracentesis blade.  Vision blue was injected into the anterior chamber.  BSS mixed with Omidria, followed by 1% lidocaine was injected into the anterior chamber.  Viscoelastic was injected into the anterior chamber.  A temporal clear-corneal main wound incision was created using a 2.50mm microkeratome.  A continuous curvilinear capsulorrhexis was initiated using an irrigating  cystitome and completed using capsulorrhexis forceps.  Hydrodissection and hydrodeliniation were performed.  Viscoelastic was injected into the anterior chamber.  A phacoemulsification handpiece and a chopper as a second instrument were used to remove the nucleus and epinucleus. The irrigation/aspiration handpiece was used to remove any remaining cortical material.   The capsular bag was reinflated with viscoelastic, checked, and found to be intact. The intraocular lens was inserted into the capsular bag and dialed into place using a kuglen hook.  The irrigation/aspiration handpiece was used to remove any remaining viscoelastic.  The clear corneal wound and paracentesis wounds were then hydrated and checked with Weck-Cels to be watertight. Two drops of maxitrol were instilled onto the ocular surface. The lid-speculum and drape was removed, and the patient's face was cleaned with a wet and dry 4x4. A clear shield was taped over the eye. The patient was taken to the post-operative care unit in good condition, having tolerated the procedure well.  Post-Op Instructions: The patient will follow up at [facility] for a same day post-operative evaluation and will receive all other orders and instructions.

## 2023-02-23 NOTE — Transfer of Care (Signed)
Immediate Anesthesia Transfer of Care Note  Patient: BHARATH PEEK  Procedure(s) Performed: CATARACT EXTRACTION PHACO AND INTRAOCULAR LENS PLACEMENT (IOC) (Right: Eye)  Patient Location: Short Stay  Anesthesia Type:MAC  Level of Consciousness: awake, alert , and patient cooperative  Airway & Oxygen Therapy: Patient Spontanous Breathing  Post-op Assessment: Report given to RN, Post -op Vital signs reviewed and stable, Patient moving all extremities X 4, and Patient able to stick tongue midline  Post vital signs: Reviewed  Last Vitals:  Vitals Value Taken Time  BP 172/70   Temp 98.0   Pulse 75   Resp 23   SpO2 96     Last Pain:  Vitals:   02/23/23 0843  TempSrc: Oral      Patients Stated Pain Goal: 6 (02/23/23 0843)  Complications: No notable events documented.

## 2023-02-28 ENCOUNTER — Encounter (HOSPITAL_COMMUNITY): Payer: Self-pay | Admitting: Optometry

## 2023-03-21 ENCOUNTER — Encounter (HOSPITAL_COMMUNITY): Payer: Self-pay

## 2023-03-21 ENCOUNTER — Emergency Department (HOSPITAL_COMMUNITY): Payer: Medicare HMO

## 2023-03-21 ENCOUNTER — Other Ambulatory Visit: Payer: Self-pay

## 2023-03-21 ENCOUNTER — Inpatient Hospital Stay (HOSPITAL_COMMUNITY)
Admission: EM | Admit: 2023-03-21 | Discharge: 2023-03-27 | DRG: 871 | Disposition: A | Discharge status: Home / Self Care | Payer: Medicare HMO | Attending: Family Medicine | Admitting: Family Medicine

## 2023-03-21 DIAGNOSIS — R410 Disorientation, unspecified: Secondary | ICD-10-CM | POA: Diagnosis not present

## 2023-03-21 DIAGNOSIS — G9341 Metabolic encephalopathy: Secondary | ICD-10-CM | POA: Diagnosis present

## 2023-03-21 DIAGNOSIS — G9340 Encephalopathy, unspecified: Secondary | ICD-10-CM | POA: Diagnosis not present

## 2023-03-21 DIAGNOSIS — A419 Sepsis, unspecified organism: Secondary | ICD-10-CM | POA: Diagnosis not present

## 2023-03-21 DIAGNOSIS — N390 Urinary tract infection, site not specified: Secondary | ICD-10-CM | POA: Diagnosis present

## 2023-03-21 DIAGNOSIS — Z8249 Family history of ischemic heart disease and other diseases of the circulatory system: Secondary | ICD-10-CM

## 2023-03-21 DIAGNOSIS — Z8551 Personal history of malignant neoplasm of bladder: Secondary | ICD-10-CM

## 2023-03-21 DIAGNOSIS — Z6832 Body mass index (BMI) 32.0-32.9, adult: Secondary | ICD-10-CM | POA: Diagnosis not present

## 2023-03-21 DIAGNOSIS — H9190 Unspecified hearing loss, unspecified ear: Secondary | ICD-10-CM | POA: Diagnosis present

## 2023-03-21 DIAGNOSIS — N3000 Acute cystitis without hematuria: Secondary | ICD-10-CM

## 2023-03-21 DIAGNOSIS — Z808 Family history of malignant neoplasm of other organs or systems: Secondary | ICD-10-CM

## 2023-03-21 DIAGNOSIS — R6520 Severe sepsis without septic shock: Secondary | ICD-10-CM | POA: Diagnosis present

## 2023-03-21 DIAGNOSIS — Z79899 Other long term (current) drug therapy: Secondary | ICD-10-CM | POA: Diagnosis not present

## 2023-03-21 DIAGNOSIS — B9620 Unspecified Escherichia coli [E. coli] as the cause of diseases classified elsewhere: Secondary | ICD-10-CM | POA: Diagnosis present

## 2023-03-21 DIAGNOSIS — K219 Gastro-esophageal reflux disease without esophagitis: Secondary | ICD-10-CM | POA: Diagnosis present

## 2023-03-21 DIAGNOSIS — E669 Obesity, unspecified: Secondary | ICD-10-CM | POA: Diagnosis present

## 2023-03-21 DIAGNOSIS — Z880 Allergy status to penicillin: Secondary | ICD-10-CM

## 2023-03-21 DIAGNOSIS — R509 Fever, unspecified: Principal | ICD-10-CM

## 2023-03-21 DIAGNOSIS — I739 Peripheral vascular disease, unspecified: Secondary | ICD-10-CM | POA: Diagnosis present

## 2023-03-21 DIAGNOSIS — I517 Cardiomegaly: Secondary | ICD-10-CM | POA: Diagnosis present

## 2023-03-21 DIAGNOSIS — A4151 Sepsis due to Escherichia coli [E. coli]: Secondary | ICD-10-CM | POA: Diagnosis not present

## 2023-03-21 DIAGNOSIS — G2581 Restless legs syndrome: Secondary | ICD-10-CM

## 2023-03-21 DIAGNOSIS — E871 Hypo-osmolality and hyponatremia: Secondary | ICD-10-CM | POA: Diagnosis present

## 2023-03-21 DIAGNOSIS — I890 Lymphedema, not elsewhere classified: Secondary | ICD-10-CM | POA: Diagnosis present

## 2023-03-21 DIAGNOSIS — M25532 Pain in left wrist: Secondary | ICD-10-CM | POA: Diagnosis present

## 2023-03-21 DIAGNOSIS — I10 Essential (primary) hypertension: Secondary | ICD-10-CM

## 2023-03-21 DIAGNOSIS — Z7982 Long term (current) use of aspirin: Secondary | ICD-10-CM | POA: Diagnosis not present

## 2023-03-21 DIAGNOSIS — N3001 Acute cystitis with hematuria: Secondary | ICD-10-CM | POA: Diagnosis not present

## 2023-03-21 DIAGNOSIS — I499 Cardiac arrhythmia, unspecified: Secondary | ICD-10-CM | POA: Diagnosis present

## 2023-03-21 DIAGNOSIS — I4510 Unspecified right bundle-branch block: Secondary | ICD-10-CM | POA: Diagnosis present

## 2023-03-21 LAB — CULTURE, BLOOD (ROUTINE X 2)
Special Requests: ADEQUATE
Special Requests: ADEQUATE

## 2023-03-21 LAB — CBC WITH DIFFERENTIAL/PLATELET
Abs Immature Granulocytes: 0.06 10*3/uL (ref 0.00–0.07)
Basophils Absolute: 0.1 10*3/uL (ref 0.0–0.1)
Basophils Relative: 0 %
Eosinophils Absolute: 0.1 10*3/uL (ref 0.0–0.5)
Eosinophils Relative: 0 %
HCT: 44.7 % (ref 39.0–52.0)
Hemoglobin: 14.3 g/dL (ref 13.0–17.0)
Immature Granulocytes: 0 %
Lymphocytes Relative: 4 %
Lymphs Abs: 0.8 10*3/uL (ref 0.7–4.0)
MCH: 30.2 pg (ref 26.0–34.0)
MCHC: 32 g/dL (ref 30.0–36.0)
MCV: 94.3 fL (ref 80.0–100.0)
Monocytes Absolute: 0.8 10*3/uL (ref 0.1–1.0)
Monocytes Relative: 4 %
Neutro Abs: 15.6 10*3/uL — ABNORMAL HIGH (ref 1.7–7.7)
Neutrophils Relative %: 92 %
Platelets: 217 10*3/uL (ref 150–400)
RBC: 4.74 MIL/uL (ref 4.22–5.81)
RDW: 13.2 % (ref 11.5–15.5)
WBC: 17.3 10*3/uL — ABNORMAL HIGH (ref 4.0–10.5)
nRBC: 0 % (ref 0.0–0.2)

## 2023-03-21 LAB — URINALYSIS, W/ REFLEX TO CULTURE (INFECTION SUSPECTED)
Bilirubin Urine: NEGATIVE
Glucose, UA: NEGATIVE mg/dL
Ketones, ur: NEGATIVE mg/dL
Nitrite: POSITIVE — AB
Protein, ur: 100 mg/dL — AB
Specific Gravity, Urine: 1.013 (ref 1.005–1.030)
WBC, UA: >50 WBC/hpf (ref 0–5)
pH: 7 (ref 5.0–8.0)

## 2023-03-21 LAB — COMPREHENSIVE METABOLIC PANEL
ALT: 14 U/L (ref 0–44)
AST: 16 U/L (ref 15–41)
Albumin: 4.3 g/dL (ref 3.5–5.0)
Alkaline Phosphatase: 83 U/L (ref 38–126)
Anion gap: 12 (ref 5–15)
BUN: 21 mg/dL (ref 8–23)
CO2: 25 mmol/L (ref 22–32)
Calcium: 9.3 mg/dL (ref 8.9–10.3)
Chloride: 98 mmol/L (ref 98–111)
Creatinine, Ser: 0.76 mg/dL (ref 0.61–1.24)
GFR, Estimated: >60 mL/min (ref >60)
Glucose, Bld: 116 mg/dL — ABNORMAL HIGH (ref 70–99)
Potassium: 3.9 mmol/L (ref 3.5–5.1)
Sodium: 135 mmol/L (ref 135–145)
Total Bilirubin: 1.6 mg/dL — ABNORMAL HIGH (ref 0.3–1.2)
Total Protein: 8.6 g/dL — ABNORMAL HIGH (ref 6.5–8.1)

## 2023-03-21 LAB — PROTIME-INR
INR: 1.1 (ref 0.8–1.2)
Prothrombin Time: 13.9 seconds (ref 11.4–15.2)

## 2023-03-21 LAB — LACTIC ACID, PLASMA
Lactic Acid, Venous: 1.8 mmol/L (ref 0.5–1.9)
Lactic Acid, Venous: 1.1 mmol/L (ref 0.5–1.9)

## 2023-03-21 MED ORDER — SODIUM CHLORIDE 0.9 % IV SOLN: INTRAVENOUS | Status: AC

## 2023-03-21 MED ORDER — PRAMIPEXOLE DIHYDROCHLORIDE 1 MG PO TABS
1.0000 mg | ORAL_TABLET | Freq: Three times a day (TID) | ORAL | Status: DC
  Administered 2023-03-22 – 2023-03-27 (×16): 1 mg via ORAL
  Filled 2023-03-21 (×16): qty 1

## 2023-03-21 MED ORDER — ACETAMINOPHEN 325 MG PO TABS
650.0000 mg | ORAL_TABLET | Freq: Four times a day (QID) | ORAL | Status: DC | PRN
  Administered 2023-03-22 – 2023-03-26 (×6): 650 mg via ORAL
  Filled 2023-03-21 (×7): qty 2

## 2023-03-21 MED ORDER — SODIUM CHLORIDE 0.9 % IV SOLN
2.0000 g | INTRAVENOUS | Status: DC
  Administered 2023-03-22: 2 g via INTRAVENOUS
  Filled 2023-03-21: qty 20

## 2023-03-21 MED ORDER — POLYETHYLENE GLYCOL 3350 17 G PO PACK: 17.0000 g | PACK | Freq: Every day | ORAL | Status: DC | PRN

## 2023-03-21 MED ORDER — ENOXAPARIN SODIUM 40 MG/0.4ML IJ SOSY
40.0000 mg | PREFILLED_SYRINGE | INTRAMUSCULAR | Status: DC
  Administered 2023-03-22 – 2023-03-27 (×6): 40 mg via SUBCUTANEOUS
  Filled 2023-03-21 (×5): qty 0.4

## 2023-03-21 MED ORDER — SODIUM CHLORIDE 0.9% FLUSH
3.0000 mL | Freq: Two times a day (BID) | INTRAVENOUS | Status: DC
  Administered 2023-03-22 – 2023-03-27 (×12): 3 mL via INTRAVENOUS

## 2023-03-21 MED ORDER — SODIUM CHLORIDE 0.9 % IV BOLUS
1000.0000 mL | Freq: Once | INTRAVENOUS | Status: AC
  Administered 2023-03-21: 1000 mL via INTRAVENOUS

## 2023-03-21 MED ORDER — ACETAMINOPHEN 650 MG RE SUPP: 650.0000 mg | Freq: Four times a day (QID) | RECTAL | Status: DC | PRN

## 2023-03-21 MED ORDER — SODIUM CHLORIDE 0.9 % IV SOLN: INTRAVENOUS | Status: DC

## 2023-03-21 MED ORDER — GABAPENTIN 300 MG PO CAPS
300.0000 mg | ORAL_CAPSULE | Freq: Two times a day (BID) | ORAL | Status: DC
  Administered 2023-03-22 – 2023-03-27 (×12): 300 mg via ORAL
  Filled 2023-03-21 (×4): qty 1
  Filled 2023-03-21: qty 3
  Filled 2023-03-21 (×7): qty 1

## 2023-03-21 MED ORDER — SODIUM CHLORIDE 0.9 % IV SOLN: 1.0000 g | Freq: Once | INTRAVENOUS | Status: DC

## 2023-03-21 MED ORDER — ACETAMINOPHEN 325 MG PO TABS
650.0000 mg | ORAL_TABLET | Freq: Once | ORAL | Status: AC
  Administered 2023-03-21: 650 mg via ORAL
  Filled 2023-03-21: qty 2

## 2023-03-21 MED ORDER — GABAPENTIN 300 MG PO CAPS
600.0000 mg | ORAL_CAPSULE | Freq: Every day | ORAL | Status: DC
  Administered 2023-03-22 – 2023-03-26 (×6): 600 mg via ORAL
  Filled 2023-03-21 (×6): qty 2

## 2023-03-21 MED ORDER — METOPROLOL SUCCINATE ER 50 MG PO TB24
100.0000 mg | ORAL_TABLET | Freq: Every day | ORAL | Status: DC
  Administered 2023-03-22 – 2023-03-27 (×6): 100 mg via ORAL
  Filled 2023-03-21 (×6): qty 2

## 2023-03-21 MED ORDER — PRAMIPEXOLE DIHYDROCHLORIDE 0.25 MG PO TABS
1.0000 mg | ORAL_TABLET | Freq: Once | ORAL | Status: AC
  Administered 2023-03-21: 1 mg via ORAL
  Filled 2023-03-21: qty 4

## 2023-03-21 NOTE — ED Notes (Signed)
Pt is asking for his RLS medication, Dr. Deretha Emory aware and he wants to hold medication, pt informed.

## 2023-03-21 NOTE — Progress Notes (Signed)
Went in to check on patient.  Patient was on RA with sat of 93 to 94%.  BS when breathing in through lungs are clear and diminished.  RN and NT were at bedside changing patient and settling him in room.  Asked RN to place patient on 2L if his sats drop and he needs it and to call if she needed anything further.

## 2023-03-21 NOTE — ED Provider Notes (Addendum)
[facility] AT [facility] Provider Note   CSN: [id number] Arrival date & time: 03/21/23  1913     History  Chief Complaint  Patient presents with   Fever    Garrett Ferguson is a 77 y.o. male.  Patient probably started with urinary tract infection yesterday.  Today there was some confusion fever.  EMS noted the temp was 103.4 blood pressures were good 181/96 heart rate around 114 EMS gave him a gram of Rocephin.  Patient gets urinary tract infection about every 3 months.  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  Patient has an allergy to penicillin but he says he does fine with cephalosporins.  Obviously he had it by EMS.  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  Does not appear that has had any follow-up since then.       Home Medications Prior to Admission medications   Medication Sig Start Date End Date Taking? Authorizing Provider  aspirin EC 81 MG tablet Take 81 mg by mouth daily.    [provider]  benzonatate (TESSALON) 100 MG capsule Take 1 capsule (100 mg total) by mouth every 8 (eight) hours. 08/16/19   Avegno, Zachery Dakins, FNP  buPROPion (WELLBUTRIN XL) 150 MG 24 hr tablet  10/13/16   [provider]  cephALEXin (KEFLEX) 500 MG capsule Take 1 capsule (500 mg total) by mouth 3 (three) times daily. 11/22/21   Wallis Bamberg, PA-C  cholecalciferol (VITAMIN D3) 25 MCG (1000 UNIT) tablet Take 1,000 Units by mouth daily.    [provider]  ciprofloxacin (CIPRO) 500 MG tablet Take 1 tablet (500 mg total) by mouth 2 (two) times daily. 06/21/22   Particia Nearing, PA-C   fluticasone (FLONASE) 50 MCG/ACT nasal spray Place 1 spray into both nostrils daily for 14 days. 08/16/19 08/30/19  Avegno, Zachery Dakins, FNP  furosemide (LASIX) 20 MG tablet Take 20 mg by mouth. 08/18/16   [provider]  gabapentin (NEURONTIN) 300 MG capsule TAKE 1 CAPSULE BY MOUTH IN THE MORNING, ONE CAPSULE AT [facility] AND TWO CAPSULES AT NIGHT 05/24/17   Nilda Riggs, NP  metoprolol succinate (TOPROL-XL) 100 MG 24 hr tablet Take 100 mg by mouth daily.  03/30/14   [provider]  Multiple Vitamin (MULTIVITAMIN WITH MINERALS) TABS Take 1 tablet by mouth daily.    [provider]  nitrofurantoin (MACRODANTIN) 100 MG capsule Take 100 mg by mouth at bedtime.    [provider]  pramipexole (MIRAPEX) 1 MG tablet TAKE 1 TABLET BY MOUTH TWICE DURING THE DAY AND ONE AND ONE-HALF TABLETS AT NIGHT 10/18/16   Nilda Riggs, NP  pramipexole (MIRAPEX) 1 MG tablet Take 1 tablet (1 mg total) by mouth 3 (three) times daily. 10/15/22   Vanetta Mulders, MD  quinapril (ACCUPRIL) 40 MG tablet Take 40 mg by mouth every morning.    [provider]  ranitidine (ZANTAC) 150 MG tablet Take 150 mg by mouth daily.    [provider]  sertraline (ZOLOFT) 100 MG tablet Take 200 mg by mouth daily.  06/10/14 06/10/15  [provider]  sertraline (ZOLOFT) 100 MG tablet Take 200 mg by mouth daily.    [provider]  traMADol (ULTRAM) 50 MG tablet Take 50 mg by mouth every 6 (six) hours as needed for moderate pain.    [provider]  zinc gluconate 50 MG tablet Take 50 mg by mouth daily.    [provider]      Allergies    Penicillins    Review of Systems   Review of Systems  Constitutional:  Positive for fever. Negative for chills.  HENT:  Negative for ear pain and sore throat.   Eyes:  Negative for pain and visual disturbance.  Respiratory:  Negative for cough and shortness of breath.   Cardiovascular:  Negative for chest  pain and palpitations.  Gastrointestinal:  Negative for abdominal pain and vomiting.  Genitourinary:  Positive for dysuria and frequency. Negative for hematuria.  Musculoskeletal:  Negative for arthralgias and back pain.  Skin:  Negative for color change and rash.  Neurological:  Negative for seizures and syncope.  All other systems reviewed and are negative.   Physical Exam Updated Vital Signs BP (!) 157/64   Pulse 100   Temp 99.1 F (37.3 C) (Oral)   Resp (!) 27   Ht 1.803 m (5\' 11" )   Wt 113 kg   SpO2 92%   BMI 34.75 kg/m  Physical Exam Vitals and nursing note reviewed.  Constitutional:      General: He is not in acute distress.    Appearance: Normal appearance. He is well-developed. He is ill-appearing.  HENT:     Head: Normocephalic and atraumatic.     Mouth/Throat:     Mouth: Mucous membranes are dry.  Eyes:     Extraocular Movements: Extraocular movements intact.     Conjunctiva/sclera: Conjunctivae normal.     Pupils: Pupils are equal, round, and reactive to light.  Cardiovascular:     Rate and Rhythm: Regular rhythm. Tachycardia present.     Heart sounds: No murmur heard. Pulmonary:     Effort: Pulmonary effort is normal. No respiratory distress.     Breath sounds: Normal breath sounds.  Abdominal:     General: There is no distension.     Palpations: Abdomen is soft.     Tenderness: There is no abdominal tenderness.  Musculoskeletal:        General: No swelling.     Cervical back: Neck supple.     Right lower leg: Edema present.     Left lower leg: Edema present.     Comments: Chronic edema to both lower extremities with erythema chronic skin changes.  Skin:    General: Skin is warm and dry.     Capillary Refill: Capillary refill takes less than 2 seconds.  Neurological:     General: No focal deficit present.     Mental Status: He is alert and oriented to person, place, and time.  Psychiatric:        Mood and Affect: Mood normal.     ED Results /  Procedures / Treatments   Labs (all labs ordered are listed, but only abnormal results are displayed) Labs Reviewed  COMPREHENSIVE METABOLIC PANEL - Abnormal; Notable for the following components:      Result Value   Glucose, Bld 116 (*)    Total Protein 8.6 (*)    Total Bilirubin 1.6 (*)    All other components within normal limits  CBC WITH DIFFERENTIAL/PLATELET - Abnormal; Notable for the following components:   WBC 17.3 (*)    Neutro Abs 15.6 (*)    All other components  within normal limits  URINALYSIS, W/ REFLEX TO CULTURE (INFECTION SUSPECTED) - Abnormal; Notable for the following components:   APPearance CLOUDY (*)    Hgb urine dipstick MODERATE (*)    Protein, ur 100 (*)    Nitrite POSITIVE (*)    Leukocytes,Ua LARGE (*)    Bacteria, UA RARE (*)    All other components within normal limits  CULTURE, BLOOD (ROUTINE X 2)  CULTURE, BLOOD (ROUTINE X 2)  URINE CULTURE  LACTIC ACID, PLASMA  PROTIME-INR  LACTIC ACID, PLASMA    EKG EKG Interpretation Date/Time:  Wednesday March 21 2023 19:19:44 EDT Ventricular Rate:  116 PR Interval:  141 QRS Duration:  174 QT Interval:  351 QTC Calculation: 488 R Axis:   16  Text Interpretation: Sinus tachycardia Right bundle branch block Artifact in lead(s) I II III aVR aVL aVF V2 V3 Confirmed by Vanetta Mulders [id number]) on 03/21/2023 10:10:43 PM  Radiology DG Chest Port 1 View  Result Date: 03/21/2023 CLINICAL DATA:  Sepsis EXAM: PORTABLE CHEST 1 VIEW COMPARISON:  04/05/2021 FINDINGS: Cardiomegaly. No confluent opacities, effusions or edema. No acute bony abnormality. IMPRESSION: Cardiomegaly.  No active disease. Electronically Signed   By: Charlett Nose M.D.   On: 03/21/2023 19:56    Procedures Procedures    Medications Ordered in ED Medications  cefTRIAXone (ROCEPHIN) 1 g in sodium chloride 0.9 % 100 mL IVPB (has no administration in time range)  0.9 %  sodium chloride infusion (has no administration in time range)  sodium  chloride 0.9 % bolus 1,000 mL (has no administration in time range)  acetaminophen (TYLENOL) tablet 650 mg (650 mg Oral Given 03/21/23 1943)    ED Course/ Medical Decision Making/ A&P                                 Medical Decision Making Amount and/or Complexity of Data Reviewed Labs: ordered. Radiology: ordered.  Risk OTC drugs. Prescription drug management. Decision regarding hospitalization.  Patient with fever.  Tachycardic but not hypotensive.  White blood cell count is elevated at 17 but lactic acid is normal.  Patient received Tylenol here still tachycardic did receive 1 g of Rocephin by EMS.  Blood cultures done urine culture sent.  Will give a liter of fluid and then 100 cc an hour.  Will get hospitalist to admit.  Does not seem to strictly meet criteria for sepsis protocol.  CRITICAL CARE Performed by: Vanetta Mulders Total critical care time: 35 minutes Critical care time was exclusive of separately billable procedures and treating other patients. Critical care was necessary to treat or prevent imminent or life-threatening deterioration. Critical care was time spent personally by me on the following activities: development of treatment plan with patient and/or surrogate as well as nursing, discussions with consultants, evaluation of patient's response to treatment, examination of patient, obtaining history from patient or surrogate, ordering and performing treatments and interventions, ordering and review of laboratory studies, ordering and review of radiographic studies, pulse oximetry and re-evaluation of patient's condition.   Final Clinical Impression(s) / ED Diagnoses Final diagnoses:  Fever, unspecified fever cause  Acute cystitis without hematuria  Confusion    Rx / DC Orders ED Discharge Orders     None         Vanetta Mulders, MD 03/21/23 2229    Vanetta Mulders, MD 03/21/23 2230    Vanetta Mulders, MD 03/21/23 2231

## 2023-03-21 NOTE — ED Notes (Signed)
Pt and family updated about delays, warm blankets offered to family members at the bedside.

## 2023-03-21 NOTE — ED Notes (Signed)
Pt requested coke, it was given.

## 2023-03-21 NOTE — ED Triage Notes (Signed)
Pt reports burning with urination and fever this evening.

## 2023-03-21 NOTE — ED Notes (Signed)
Family is asking for an update, Dr. Deretha Emory informed.

## 2023-03-21 NOTE — ED Notes (Signed)
Pt room assignment changed to 317.  I tried to call family to inform them but it went to voicemail and the mailbox was full so I was unable to leave a message.

## 2023-03-21 NOTE — H&P (Addendum)
History and Physical    Garrett Ferguson WGN:[id number] DOB: 04-06-1946 DOA: 03/21/2023  PCP: [facility]   Patient coming from: Home   Chief Complaint: Dysuria, fever, confusion   HPI: Garrett Ferguson is a pleasant 77 y.o. male with medical history significant for hypertension, restless leg syndrome, and bladder cancer, now presenting for evaluation of dysuria, fever, and confusion.  Patient reports that he has been experiencing dysuria as well as bilateral flank pain, and then developed a fever this morning.  His wife noted that he seemed to be confused.  He denies cough, shortness of breath, or chest pain.  He was febrile and tachycardic with EMS, and they administered 1 g of Rocephin prior to arrival in the ED.  ED Course: Upon arrival to the ED, patient is found to be febrile to 39.7 C and slightly tachycardic with elevated blood pressure.  EKG demonstrates sinus tachycardia with RBBB.  Chest x-ray is negative for acute findings.  Labs are most notable for WBC 17,200 and normal lactic acid.  Blood and urine cultures were collected in the ED and the patient was given a liter of normal saline and acetaminophen.  Review of Systems:  All other systems reviewed and apart from HPI, are negative.  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   GERD (gastroesophageal reflux disease)    H/O: rheumatic fever    age 73   Hearing loss    History of ulcer disease 04/14/1969   Hypertension    Incontinence of urine    Restless leg syndrome    Restless legs syndrome (RLS) 08/08/2013   RSD (reflex sympathetic dystrophy)    L hand    Past Surgical History:  Procedure Laterality Date   BLADDER SURGERY      transitional cell carcinoma   carpall tunnel     L hand   CATARACT EXTRACTION W/PHACO Right 02/23/2023   Procedure: CATARACT EXTRACTION PHACO AND INTRAOCULAR LENS PLACEMENT (IOC);  Surgeon: Pecolia Ades, MD;  Location: [facility];  Service: Ophthalmology;  Laterality: Right;  CDE 11.94   CHOLECYSTECTOMY  08/14/1977   FOOT SURGERY     x5   JOINT REPLACEMENT Right replacements x3   l knee with multiple surg on same knee   NASAL SINUS SURGERY  04/14/1989   PROSTATE BIOPSY     TOTAL KNEE REVISION  04/23/2012   Procedure: TOTAL KNEE REVISION;  Surgeon: Eulas Post, MD;  Location: [facility];  Service: Orthopedics;  Laterality: Left;   TRANSURETHRAL RESECTION OF PROSTATE      Social History:   reports that he has never smoked. He has never used smokeless tobacco. He reports that he does not drink alcohol and does not use drugs.  Allergies  Allergen Reactions   Penicillins Rash    Family History  Problem Relation Age of Onset   Cancer Father    Melanoma Father    Heart disease Sister    Cancer Other    Melanoma Other    High blood pressure Other      Prior to Admission medications   Medication Sig Start Date End Date Taking? Authorizing Provider  aspirin EC 81 MG tablet Take 81 mg by mouth daily.    [provider]  benzonatate (TESSALON) 100 MG capsule Take 1 capsule (100 mg total) by mouth every 8 (eight) hours. 08/16/19   Avegno, Zachery Dakins, FNP  buPROPion (WELLBUTRIN XL) 150 MG 24 hr tablet  10/13/16   [provider]  cephALEXin (KEFLEX) 500 MG capsule Take 1 capsule (500 mg total) by mouth 3 (three) times daily. 11/22/21   Wallis Bamberg, PA-C  cholecalciferol (VITAMIN D3) 25 MCG (1000 UNIT) tablet Take 1,000 Units by mouth daily.    [provider]  ciprofloxacin (CIPRO) 500 MG tablet Take 1 tablet (500 mg total) by mouth 2 (two) times daily. 06/21/22   Particia Nearing, PA-C  fluticasone (FLONASE) 50 MCG/ACT nasal spray Place 1 spray into both  nostrils daily for 14 days. 08/16/19 08/30/19  Avegno, Zachery Dakins, FNP  furosemide (LASIX) 20 MG tablet Take 20 mg by mouth. 08/18/16   [provider]  gabapentin (NEURONTIN) 300 MG capsule TAKE 1 CAPSULE BY MOUTH IN THE MORNING, ONE CAPSULE AT [facility] AND TWO CAPSULES AT NIGHT 05/24/17   Nilda Riggs, NP  metoprolol succinate (TOPROL-XL) 100 MG 24 hr tablet Take 100 mg by mouth daily.  03/30/14   [provider]  Multiple Vitamin (MULTIVITAMIN WITH MINERALS) TABS Take 1 tablet by mouth daily.    [provider]  nitrofurantoin (MACRODANTIN) 100 MG capsule Take 100 mg by mouth at bedtime.    [provider]  pramipexole (MIRAPEX) 1 MG tablet TAKE 1 TABLET BY MOUTH TWICE DURING THE DAY AND ONE AND ONE-HALF TABLETS AT NIGHT 10/18/16   Nilda Riggs, NP  pramipexole (MIRAPEX) 1 MG tablet Take 1 tablet (1 mg total) by mouth 3 (three) times daily. 10/15/22   Vanetta Mulders, MD  quinapril (ACCUPRIL) 40 MG tablet Take 40 mg by mouth every morning.    [provider]  ranitidine (ZANTAC) 150 MG tablet Take 150 mg by mouth daily.    [provider]  sertraline (ZOLOFT) 100 MG tablet Take 200 mg by mouth daily.  06/10/14 06/10/15  [provider]  sertraline (ZOLOFT) 100 MG tablet Take 200 mg by mouth daily.    [provider]  traMADol (ULTRAM) 50 MG tablet Take 50 mg by mouth every 6 (six) hours as needed for moderate pain.    [provider]  zinc gluconate 50 MG tablet Take 50 mg by mouth daily.    [provider]    Physical Exam: Vitals:   03/21/23 2130 03/21/23 2145 03/21/23 2200 03/21/23 2230  BP: (!) 157/64 (!) 147/58    Pulse: 100 (!) 103    Resp: (!) 27 19    Temp:      TempSrc:      SpO2: 92%  93% 95%  Weight:      Height:         Constitutional: NAD, calm  Eyes: PERTLA, lids and conjunctivae normal ENMT: Mucous membranes are moist. Posterior pharynx clear of any exudate or lesions.    Neck: supple, no masses  Respiratory: no wheezing, no crackles. No accessory muscle use.  Cardiovascular: S1 & S2 heard, regular rate and rhythm. No significant JVD. Abdomen: No distension, no tenderness, soft. Bowel sounds active.  Musculoskeletal: no clubbing / cyanosis. No joint deformity upper and lower extremities.   Skin: Hyperpigmentation involving b/l lower legs in gaiter distribution. Warm, dry, well-perfused. Neurologic: CN 2-12 grossly intact. Sensation to light  touch intact. Moving all extremities. Alert and oriented to person, place, and situation.  Psychiatric: Pleasant. Cooperative.    Labs and Imaging on Admission: I have personally reviewed following labs and imaging studies  CBC: Recent Labs  Lab 03/21/23 1945  WBC 17.3*  NEUTROABS 15.6*  HGB 14.3  HCT 44.7  MCV 94.3  PLT 217   Basic Metabolic Panel: Recent Labs  Lab 03/21/23 1945  NA 135  K 3.9  CL 98  CO2 25  GLUCOSE 116*  BUN 21  CREATININE 0.76  CALCIUM 9.3   GFR: Estimated Creatinine Clearance: 97.3 mL/min (by C-G formula based on SCr of 0.76 mg/dL). Liver Function Tests: Recent Labs  Lab 03/21/23 1945  AST 16  ALT 14  ALKPHOS 83  BILITOT 1.6*  PROT 8.6*  ALBUMIN 4.3   No results for input(s): "LIPASE", "AMYLASE" in the last 168 hours. No results for input(s): "AMMONIA" in the last 168 hours. Coagulation Profile: Recent Labs  Lab 03/21/23 1945  INR 1.1   Cardiac Enzymes: No results for input(s): "CKTOTAL", "CKMB", "CKMBINDEX", "TROPONINI" in the last 168 hours. BNP (last 3 results) No results for input(s): "PROBNP" in the last 8760 hours. HbA1C: No results for input(s): "HGBA1C" in the last 72 hours. CBG: No results for input(s): "GLUCAP" in the last 168 hours. Lipid Profile: No results for input(s): "CHOL", "HDL", "LDLCALC", "TRIG", "CHOLHDL", "LDLDIRECT" in the last 72 hours. Thyroid Function Tests: No results for input(s): "TSH", "T4TOTAL", "FREET4", "T3FREE", "THYROIDAB"  in the last 72 hours. Anemia Panel: No results for input(s): "VITAMINB12", "FOLATE", "FERRITIN", "TIBC", "IRON", "RETICCTPCT" in the last 72 hours. Urine analysis:    Component Value Date/Time   COLORURINE YELLOW 03/21/2023 1936   APPEARANCEUR CLOUDY (A) 03/21/2023 1936   APPEARANCEUR Cloudy 04/05/2012 1252   LABSPEC 1.013 03/21/2023 1936   LABSPEC 1.015 04/05/2012 1252   PHURINE 7.0 03/21/2023 1936   GLUCOSEU NEGATIVE 03/21/2023 1936   GLUCOSEU Negative 04/05/2012 1252   HGBUR MODERATE (A) 03/21/2023 1936   BILIRUBINUR NEGATIVE 03/21/2023 1936   BILIRUBINUR small (A) 06/21/2022 1903   BILIRUBINUR Negative 04/05/2012 1252   KETONESUR NEGATIVE 03/21/2023 1936   PROTEINUR 100 (A) 03/21/2023 1936   UROBILINOGEN 1.0 06/21/2022 1903   UROBILINOGEN 0.2 04/19/2012 1258   NITRITE POSITIVE (A) 03/21/2023 1936   LEUKOCYTESUR LARGE (A) 03/21/2023 1936   LEUKOCYTESUR 2+ 04/05/2012 1252   Sepsis Labs: @LABRCNTIP (procalcitonin:4,lacticidven:4) ) Recent Results (from the past 240 hour(s))  Culture, blood (Routine x 2)     Status: None (Preliminary result)   Collection Time: 03/21/23  7:45 PM   Specimen: BLOOD  Result Value Ref Range Status   Specimen Description BLOOD BLOOD RIGHT ARM  Final   Special Requests   Final    BOTTLES DRAWN AEROBIC AND ANAEROBIC Blood Culture adequate volume Performed at [facility], [address] [place], [place] [address]    Culture PENDING  Incomplete   Report Status PENDING  Incomplete  Culture, blood (Routine x 2)     Status: None (Preliminary result)   Collection Time: 03/21/23  8:06 PM   Specimen: BLOOD  Result Value Ref Range Status   Specimen Description BLOOD BLOOD LEFT HAND  Final   Special Requests   Final    BOTTLES DRAWN AEROBIC ONLY Blood Culture adequate volume Performed at [facility], [address], [place], [place] [address]    Culture PENDING  Incomplete   Report Status PENDING  Incomplete     Radiological Exams on  Admission: DG  Chest Port 1 View  Result Date: 03/21/2023 CLINICAL DATA:  Sepsis EXAM: PORTABLE CHEST 1 VIEW COMPARISON:  04/05/2021 FINDINGS: Cardiomegaly. No confluent opacities, effusions or edema. No acute bony abnormality. IMPRESSION: Cardiomegaly.  No active disease. Electronically Signed   By: Charlett Nose M.D.   On: 03/21/2023 19:56    EKG: Independently reviewed. Sinus tachycardia, rate 116, RBBB.   Assessment/Plan   1. Sepsis d/t UTI  - Treat with 2 g IV Rocephin q24h, follow cultures and clinical response to treatment    2. Acute encephalopathy  - No acute findings on head CT  - Likely d/t acute infectious process; expand workup if fails to improve as expected with treatment of infection   3. Hypertension  - Continue antihypertensives    4. RLS  - Continue Mirapex     DVT prophylaxis: Lovenox  Code Status: Full  Level of Care: Level of care: Telemetry Family Communication: Wife updated by ED  Disposition Plan:  Patient is from: Home  Anticipated d/c is to: TBD Anticipated d/c date is: 8/9 or 03/24/23  Patient currently: Pending improvement in infection and transition to oral antibiotic  Consults called: None  Admission status: Inpatient     Briscoe Deutscher, MD [facility]  03/21/2023, 11:31 PM

## 2023-03-21 NOTE — ED Notes (Signed)
Pt wife notified via telephone that pt is assigned to room 305.

## 2023-03-22 DIAGNOSIS — N3000 Acute cystitis without hematuria: Secondary | ICD-10-CM | POA: Diagnosis not present

## 2023-03-22 DIAGNOSIS — A419 Sepsis, unspecified organism: Secondary | ICD-10-CM | POA: Diagnosis not present

## 2023-03-22 DIAGNOSIS — G9340 Encephalopathy, unspecified: Secondary | ICD-10-CM | POA: Diagnosis not present

## 2023-03-22 DIAGNOSIS — R6520 Severe sepsis without septic shock: Secondary | ICD-10-CM | POA: Insufficient documentation

## 2023-03-22 MED ORDER — ENSURE ENLIVE PO LIQD
237.0000 mL | Freq: Three times a day (TID) | ORAL | Status: DC
  Administered 2023-03-22 – 2023-03-25 (×10): 237 mL via ORAL

## 2023-03-22 MED ORDER — PREDNISOLONE ACETATE 1 % OP SUSP
1.0000 [drp] | Freq: Three times a day (TID) | OPHTHALMIC | Status: DC
  Administered 2023-03-23 – 2023-03-27 (×13): 1 [drp] via OPHTHALMIC
  Filled 2023-03-22: qty 1

## 2023-03-22 MED ORDER — HYDRALAZINE HCL 25 MG PO TABS
25.0000 mg | ORAL_TABLET | Freq: Four times a day (QID) | ORAL | Status: DC | PRN
  Administered 2023-03-22 – 2023-03-26 (×3): 25 mg via ORAL
  Filled 2023-03-22 (×3): qty 1

## 2023-03-22 MED ORDER — ADULT MULTIVITAMIN W/MINERALS CH
1.0000 | ORAL_TABLET | Freq: Every day | ORAL | Status: DC
  Administered 2023-03-22 – 2023-03-27 (×6): 1 via ORAL
  Filled 2023-03-22 (×6): qty 1

## 2023-03-22 NOTE — Hospital Course (Addendum)
77 year old male with a history of bladder cancer, hypertension, restless leg syndrome, lymphedema, peripheral arterial disease presenting with fever and altered mental status with associated dysuria.  History is supplemented by the patient's wife.  Apparently the patient had been complaining of some urinary frequency and dysuria for about 2 days prior to admission.  On the morning of 03/21/2023, the patient's wife left for work and noted that the patient was sitting in his recliner.  He had seemed sleepy at that time.  When she returned from work later in the evening around 4 to 5 PM, the patient was still sitting in his recliner and was lethargic and slow to answer and confused.  He felt hot to the touch.  EMS was activated.  The patient had temperature of 103.4 F per EMS.  He was given a gram of ceftriaxone by EMS and brought to the emergency department.  Apparently the patient had a few episodes of nausea and vomiting on 03/21/2023.  He had denied any headache, chest pain, shortness breath, cough, hemoptysis, abdominal pain. In the ED, the patient was febrile up to 100.4 F.  He was hemodynamically stable with oxygen saturation 97% on room air.  WBC 17.3, hemoglobin 14.3, platelets 217,000.  Sodium 132, potassium 3.8, bicarbonate 22, serum creatinine 0.67.  Lactic acid peaked at 1.8.  UA showed >50 WBC.  Urine and blood cultures were obtained.  The patient was started on ceftriaxone.

## 2023-03-22 NOTE — Plan of Care (Signed)

## 2023-03-22 NOTE — Progress Notes (Signed)
Initial Nutrition Assessment  DOCUMENTATION CODES:   Obesity unspecified  INTERVENTION:   -Continue with regular diet for widest variety of meal selections -MVI with minerals daily -Ensure Enlive po TID, each supplement provides 350 kcal and 20 grams of protein.   NUTRITION DIAGNOSIS:   Increased nutrient needs related to acute illness as evidenced by estimated needs.  GOAL:   Patient will meet greater than or equal to 90% of their needs  MONITOR:   PO intake, Supplement acceptance  REASON FOR ASSESSMENT:   Malnutrition Screening Tool    ASSESSMENT:   Pt with medical history significant for hypertension, restless leg syndrome, and bladder cancer, now presenting for evaluation of dysuria, fever, and confusion.  Pt admitted with sepsis secondary to UTI and acute encephalopathy.   Reviewed I/O's: -60 ml x 24 hours and -247 ml since admission  UOP: 300 ml x 24 hours  Pt unavailable at time of visit. Attempted to speak with pt via call to hospital room phone, however, unable to reach. RD unable to obtain further nutrition-related history or complete nutrition-focused physical exam at this time.    Pt currently on a regular diet. No meal completion data available to assess at this time.   Reviewed wt hx; pt has experienced a 7.4% wt loss over the past month, which is significant for time frame. Pt also with deep pitting edema per RN assessment which may be masking further weight loss as well as fat and muscle depletions.   Pt is at high risk for malnutrition, however, unable to identify at this time. Pt would greatly benefit from addition of oral nutrition supplements.   Medications reviewed and include neurontin and 0.9% sodium chloride infusion @ 100 ml/hr.   Labs reviewed: Na: 132.   Diet Order:   Diet Order             Diet regular Room service appropriate? Yes; Fluid consistency: Thin  Diet effective now                   EDUCATION NEEDS:   No  education needs have been identified at this time  Skin:  Skin Assessment: Reviewed RN Assessment  Last BM:  03/21/23  Height:   Ht Readings from Last 1 Encounters:  03/21/23 5\' 11"  (1.803 m)    Weight:   Wt Readings from Last 1 Encounters:  03/22/23 105 kg    Ideal Body Weight:  78.2 kg  BMI:  Body mass index is 32.29 kg/m.  Estimated Nutritional Needs:   Kcal:  2150-2350  Protein:  105-120 grams  Fluid:  > 2 L    Levada Schilling, RD, LDN, CDCES Registered Dietitian II Certified Diabetes Care and Education Specialist Please refer to [facility] for RD and/or RD on-call/weekend/after hours pager

## 2023-03-22 NOTE — Progress Notes (Signed)
PROGRESS NOTE  AZARION NORBY AOZ:[id number] DOB: September 23, 1945 DOA: 03/21/2023 [facility]CP: [facility]  Brief History:  77 year old male with a history of bladder cancer, hypertension, restless leg syndrome, lymphedema, peripheral arterial disease presenting with fever and altered mental status with associated dysuria.  History is supplemented by the patient's wife.  Apparently the patient had been complaining of some urinary frequency and dysuria for about 2 days prior to admission.  On the morning of 03/21/2023, the patient's wife left for work and noted that the patient was sitting in his recliner.  He had seemed sleepy at that time.  When she returned from work later in the evening around 4 to 5 PM, the patient was still sitting in his recliner and was lethargic and slow to answer and confused.  He felt hot to the touch.  EMS was activated.  The patient had temperature of 103.4 F per EMS.  He was given a gram of ceftriaxone by EMS and brought to the emergency department.  Apparently the patient had a few episodes of nausea and vomiting on 03/21/2023.  He had denied any headache, chest pain, shortness breath, cough, hemoptysis, abdominal pain. In the ED, the patient was febrile up to 100.4 F.  He was hemodynamically stable with oxygen saturation 97% on room air.  WBC 17.3, hemoglobin 14.3, platelets 217,000.  Sodium 132, potassium 3.8, bicarbonate 22, serum creatinine 0.67.  Lactic acid peaked at 1.8.  UA showed >50 WBC.  Urine and blood cultures were obtained.  The patient was started on ceftriaxone.   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  The patient was prepped and draped in the usual sterile fashion. Flexible cystosopy was performed.  - 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 Multiple. Most narrow of which was proximal most bulbar urethra just distal to membranous urethra. Able to pass the scope with some pressure - Open prostatic defect - No obvious tumors or worrisome lesions. Significant sediment Orthotopic UO's. Mild debris in bladder. Trabeculated bladder with a large diverticulum at the dome. Retroflexion was normal.  Voided urine was sent for cytology.  12/04/22-- [facility] vascular surg Patient was last seen on 11/28/2021, where imaging demonstrated mild segmental reflux in right leg perforator and GSV at [facility] otherwise no significant venous disease and ABI in 2022 was 0.83L and 0.92R. He has severe secondary lymphedema.    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 Multiple. Most narrow of which was proximal most bulbar urethra just distal to membranous urethra>>>no worrisome tumor or lesions  Lymphedema -Patient uses lymphedema pumps at  home -follow up [facility] vascular surgery, lymphedema  clinic  Restless leg syndrome -Continue pramipexole        Family Communication:   spouse updated 03/22/23  Consultants:  none  Code Status:  FULL   DVT Prophylaxis:  [place] Lovenox   Procedures: As Listed in Progress Note Above  Antibiotics: Ceftriaxone 8/7>>      Subjective: Patient denies fevers, chills, headache, chest pain, dyspnea, nausea, vomiting, diarrhea, abdominal pain, dysuria, hematuria, hematochezia, and melena.   Objective: Vitals:   03/22/23 0012 03/22/23 0242 03/22/23 0956 03/22/23 1400  BP: (!) 184/69 (!) 137/53 (!) 171/70 120/63  Pulse: (!) 112 92 90 70  Resp: 20 18 18 18   Temp: (!) 101.3 F (38.5 C) 98.2 F (36.8 C) 99.1 F (37.3 C) 99 F (37.2 C)  TempSrc: Oral  Oral Oral  SpO2: 97% 91% 97% 96%  Weight: 105 kg     Height:        Intake/Output Summary (Last 24 hours) at 03/22/2023 1556 Last data filed at 03/22/2023 0953 Gross per 24 hour  Intake 552.81 ml  Output 1600 ml  Net -1047.19 ml   Weight change:  Exam:  General:  Pt is alert, follows commands appropriately, not in acute distress HEENT: No icterus, No thrush, No neck mass, [place]/AT Cardiovascular: RRR, S1/S2, no rubs, no gallops Respiratory: CTA bilaterally, no wheezing, no crackles, no rhonchi Abdomen: Soft/+BS, non tender, non distended, no guarding Extremities: No edema, No lymphangitis, No petechiae, No rashes, no synovitis   Data Reviewed: I have personally reviewed following labs and imaging studies Basic Metabolic Panel: Recent Labs  Lab 03/21/23 1945 03/22/23 0420  NA 135 132*  K 3.9 3.8  CL 98 101  CO2 25 22  GLUCOSE 116* 116*  BUN 21 17  CREATININE 0.76 0.67  CALCIUM 9.3 8.3*   Liver Function Tests: Recent Labs  Lab 03/21/23 1945  AST 16  ALT 14  ALKPHOS 83  BILITOT 1.6*  PROT 8.6*  ALBUMIN 4.3   No results for input(s): "LIPASE", "AMYLASE" in the last 168 hours. No results for input(s): "AMMONIA" in the last 168 hours. Coagulation  Profile: Recent Labs  Lab 03/21/23 1945  INR 1.1   CBC: Recent Labs  Lab 03/21/23 1945 03/22/23 0420  WBC 17.3* 18.9*  NEUTROABS 15.6*  --   HGB 14.3 12.3*  HCT 44.7 38.4*  MCV 94.3 94.3  PLT 217 180   Cardiac Enzymes: No results for input(s): "CKTOTAL", "CKMB", "CKMBINDEX", "TROPONINI" in the last 168 hours. BNP: Invalid input(s): "POCBNP" CBG: No results for input(s): "GLUCAP" in the last 168 hours. HbA1C: No results for input(s): "HGBA1C" in the last 72 hours. Urine analysis:    Component Value Date/Time   COLORURINE YELLOW 03/21/2023 1936   APPEARANCEUR CLOUDY (A) 03/21/2023 1936   APPEARANCEUR Cloudy 04/05/2012 1252   LABSPEC 1.013 03/21/2023 1936   LABSPEC 1.015 04/05/2012 1252   PHURINE 7.0 03/21/2023 1936   GLUCOSEU NEGATIVE 03/21/2023 1936   GLUCOSEU Negative 04/05/2012 1252   HGBUR MODERATE (A) 03/21/2023 1936   BILIRUBINUR NEGATIVE 03/21/2023 1936   BILIRUBINUR small (A) 06/21/2022 1903   BILIRUBINUR Negative 04/05/2012 1252   KETONESUR NEGATIVE 03/21/2023 1936   PROTEINUR 100 (A) 03/21/2023 1936   UROBILINOGEN 1.0 06/21/2022 1903   UROBILINOGEN 0.2 04/19/2012 1258   NITRITE POSITIVE (A) 03/21/2023 1936   LEUKOCYTESUR LARGE (A) 03/21/2023 1936   LEUKOCYTESUR 2+ 04/05/2012 1252   Sepsis Labs: @LABRCNTIP (procalcitonin:4,lacticidven:4) ) Recent Results (from the past 240 hour(s))  Culture, blood (Routine x 2)     Status: None (Preliminary result)   Collection Time: 03/21/23  7:45 PM   Specimen: BLOOD  Result Value Ref Range Status   Specimen Description BLOOD BLOOD RIGHT ARM  Final   Special Requests   Final    BOTTLES DRAWN AEROBIC AND ANAEROBIC Blood Culture adequate volume   Culture   Final    NO GROWTH < 12 HOURS Performed at [facility], [address], [place], [place] [address]    Report Status PENDING  Incomplete  Culture, blood (Routine x 2)     Status: None (Preliminary result)   Collection Time: 03/21/23  8:06 PM   Specimen: BLOOD   Result Value Ref Range Status   Specimen Description BLOOD BLOOD LEFT HAND  Final   Special Requests   Final    BOTTLES DRAWN AEROBIC ONLY Blood Culture adequate volume   Culture   Final    NO GROWTH < 12 HOURS Performed at [facility], [address], [place], [place] [address]    Report Status PENDING  Incomplete     Scheduled Meds:  enoxaparin (LOVENOX) injection  40 mg Subcutaneous Q24H   feeding supplement  237 mL Oral TID BM   gabapentin  300 mg Oral BID WC   gabapentin  600 mg Oral QHS   metoprolol succinate  100 mg Oral Daily   multivitamin with minerals  1 tablet Oral Daily   pramipexole  1 mg Oral TID   sodium chloride flush  3 mL Intravenous Q12H   Continuous Infusions:  cefTRIAXone (ROCEPHIN)  IV      Procedures/Studies: DG Chest Port 1 View  Result Date: 03/21/2023 CLINICAL DATA:  Sepsis EXAM: PORTABLE CHEST 1 VIEW COMPARISON:  04/05/2021 FINDINGS: Cardiomegaly. No confluent opacities, effusions or edema. No acute bony abnormality. IMPRESSION: Cardiomegaly.  No active disease. Electronically Signed   By: Charlett Nose M.D.   On: 03/21/2023 19:56    Catarina Hartshorn, DO  [facility]  If 7PM-7AM, please contact night-coverage [email or web address] Password [email or web address] 03/22/2023, 3:56 PM   LOS: 1 day

## 2023-03-22 NOTE — Progress Notes (Addendum)
   03/22/23 1132  TOC Brief Assessment  Insurance and Status Reviewed  Patient has primary care physician Yes  Home environment has been reviewed with spouse  Prior level of function: independent  Prior/Current Home Services No current home services  Social Determinants of Health Reivew SDOH reviewed no interventions necessary  Readmission risk has been reviewed Yes  Transition of care needs no transition of care needs at this time   Transition of Care Department [facility]) has reviewed patient and no TOC needs have been identified at this time. We will continue to monitor patient advancement through interdisciplinary progression rounds. If new patient transition needs arise, please place a TOC consult.  Patient is active with [facility], PT/OT

## 2023-03-22 NOTE — Progress Notes (Signed)
   03/22/23 0012  Assess: MEWS Score  Temp (!) 101.3 F (38.5 C)  BP (!) 184/69  MAP (mmHg) 101  Pulse Rate (!) 112  Resp 20  SpO2 97 %  O2 Device Room Air  Assess: MEWS Score  MEWS Temp 1  MEWS Systolic 0  MEWS Pulse 2  MEWS RR 0  MEWS LOC 0  MEWS Score 3  MEWS Score Color Yellow  Assess: if the MEWS score is Yellow or Red  Were vital signs accurate and taken at a resting state? Yes  Does the patient meet 2 or more of the SIRS criteria? Yes  Does the patient have a confirmed or suspected source of infection? Yes  MEWS guidelines implemented  Yes, yellow  Treat  MEWS Interventions Considered administering scheduled or prn medications/treatments as ordered  Take Vital Signs  Increase Vital Sign Frequency  Yellow: Q2hr x1, continue Q4hrs until patient remains green for 12hrs  Escalate  MEWS: Escalate Yellow: Discuss with charge nurse and consider notifying provider and/or RRT  Notify: Charge Nurse/RN  Name of Charge Nurse/RN Notified Harriett Sine, RN  Provider Notification  Provider Name/Title Opyd, MD  Date Provider Notified 03/22/23  Assess: SIRS CRITERIA  SIRS Temperature  1  SIRS Pulse 1  SIRS Respirations  0  SIRS WBC 0  SIRS Score Sum  2

## 2023-03-23 ENCOUNTER — Inpatient Hospital Stay (HOSPITAL_COMMUNITY): Payer: Medicare HMO

## 2023-03-23 DIAGNOSIS — G9340 Encephalopathy, unspecified: Secondary | ICD-10-CM | POA: Diagnosis not present

## 2023-03-23 DIAGNOSIS — A419 Sepsis, unspecified organism: Secondary | ICD-10-CM | POA: Diagnosis not present

## 2023-03-23 DIAGNOSIS — N3000 Acute cystitis without hematuria: Secondary | ICD-10-CM | POA: Diagnosis not present

## 2023-03-23 MED ORDER — NAPHAZOLINE-GLYCERIN 0.012-0.25 % OP SOLN
2.0000 [drp] | Freq: Four times a day (QID) | OPHTHALMIC | Status: DC | PRN
  Administered 2023-03-24 – 2023-03-26 (×2): 2 [drp] via OPHTHALMIC
  Filled 2023-03-23: qty 15

## 2023-03-23 MED ORDER — NAPHAZOLINE-GLYCERIN 0.012-0.25 % OP SOLN: 2.0000 [drp] | Freq: Four times a day (QID) | OPHTHALMIC | Status: AC | PRN

## 2023-03-23 MED ORDER — CEFDINIR 300 MG PO CAPS: 300.0000 mg | ORAL_CAPSULE | Freq: Two times a day (BID) | ORAL | Status: DC

## 2023-03-23 MED ORDER — SODIUM CHLORIDE 0.9 % IV SOLN
1.0000 g | INTRAVENOUS | Status: DC
  Administered 2023-03-23 – 2023-03-24 (×2): 1 g via INTRAVENOUS
  Filled 2023-03-23 (×2): qty 10

## 2023-03-23 MED ORDER — CEFDINIR 300 MG PO CAPS: 300.0000 mg | ORAL_CAPSULE | Freq: Two times a day (BID) | ORAL | 0 refills | Status: DC

## 2023-03-23 NOTE — Progress Notes (Signed)
Came to bedside at 1735-- Wife present with patient --wife now concerned patient is weak from functional standpoint --wife now reconsidering for patient to go to SNF --I discussed with wife and patient that this was the recommendation from PT evaluation --re-assured pt and spouse that staying in hospital for [facility] to assist in transitioning to SNF is ok --reconsult TOC to help transition to SNF --updated spouse about pt's medical condition>>stable and improving  DTat

## 2023-03-23 NOTE — Discharge Instructions (Signed)
Follow up with your PCP about a trapeze bar for above the bed.

## 2023-03-23 NOTE — Plan of Care (Signed)
  Problem: Acute Rehab PT Goals(only PT should resolve) Goal: Pt Will Go Supine/Side To Sit Outcome: Progressing Flowsheets (Taken 03/23/2023 1218) Pt will go Supine/Side to Sit:  with contact guard assist  with minimal assist Goal: Patient Will Transfer Sit To/From Stand Outcome: Progressing Flowsheets (Taken 03/23/2023 1218) Patient will transfer sit to/from stand: with minimal assist Goal: Pt Will Transfer Bed To Chair/Chair To Bed Outcome: Progressing Flowsheets (Taken 03/23/2023 1218) Pt will Transfer Bed to Chair/Chair to Bed: with min assist Goal: Pt Will Ambulate Outcome: Progressing Flowsheets (Taken 03/23/2023 1218) Pt will Ambulate:  15 feet  with moderate assist  with rolling walker   12:19 PM, 03/23/23 Ocie Bob, MPT Physical Therapist with [facility] [phone] office [phone] mobile phone

## 2023-03-23 NOTE — Evaluation (Signed)
Physical Therapy Evaluation Patient Details Name: Garrett Ferguson MRN: [id number] DOB: Sep 12, 1945 Today's Date: 03/23/2023  History of Present Illness  Garrett Ferguson is a pleasant 77 y.o. male with medical history significant for hypertension, restless leg syndrome, and bladder cancer, now presenting for evaluation of dysuria, fever, and confusion.     Patient reports that he has been experiencing dysuria as well as bilateral flank pain, and then developed a fever this morning.  His wife noted that he seemed to be confused.  He denies cough, shortness of breath, or chest pain.     He was febrile and tachycardic with EMS, and they administered 1 g of Rocephin prior to arrival in the ED.   Clinical Impression  Patient demonstrates slow labored movement for sitting up at bedside with difficulty propping up onto elbows and moving legs due to weakness.  Patient limited to a few unsteady labored side steps before having to sit due to BLE weakness with buckling of knees.  Patient tolerated sitting up in chair after therapy.  Patient will benefit from continued skilled physical therapy in hospital and recommended venue below to increase strength, balance, endurance for safe ADLs and gait.          If plan is discharge home, recommend the following: A lot of help with bathing/dressing/bathroom;A lot of help with walking and/or transfers;Help with stairs or ramp for entrance;Assistance with cooking/housework   Can travel by private vehicle   No    Equipment Recommendations None recommended by PT  Recommendations for Other Services       Functional Status Assessment Patient has had a recent decline in their functional status and demonstrates the ability to make significant improvements in function in a reasonable and predictable amount of time.     Precautions / Restrictions Precautions Precautions: Fall Restrictions Weight Bearing Restrictions: No      Mobility  Bed Mobility Overal bed  mobility: Needs Assistance Bed Mobility: Supine to Sit     Supine to sit: Min assist, HOB elevated     General bed mobility comments: increased time, labored movement with HOB elevated    Transfers Overall transfer level: Needs assistance Equipment used: Rolling walker (2 wheels) Transfers: Sit to/from Stand, Bed to chair/wheelchair/BSC Sit to Stand: Mod assist   Step pivot transfers: Mod assist, Max assist       General transfer comment: unsteady labored movement with frequent buckling of knees due to weakness    Ambulation/Gait Ambulation/Gait assistance: Mod assist, Max assist Gait Distance (Feet): 4 Feet Assistive device: Rolling walker (2 wheels) Gait Pattern/deviations: Decreased step length - right, Decreased step length - left, Decreased stride length, Knees buckling Gait velocity: slow     General Gait Details: limited to a few slow labored unsteady side steps with buckling of knees due to weakness  Stairs            Wheelchair Mobility     Tilt Bed    Modified Rankin (Stroke Patients Only)       Balance Overall balance assessment: Needs assistance Sitting-balance support: Feet supported, No upper extremity supported Sitting balance-Leahy Scale: Fair Sitting balance - Comments: fair/good seated at EOB   Standing balance support: Reliant on assistive device for balance, During functional activity, Bilateral upper extremity supported Standing balance-Leahy Scale: Poor Standing balance comment: using RW  Pertinent Vitals/Pain Pain Assessment Pain Assessment: 0-10 Pain Score: 8  Pain Location: left wrist (possibly due to IV insertion attempts) Pain Descriptors / Indicators: Discomfort, Sore Pain Intervention(s): Limited activity within patient's tolerance, Monitored during session, Repositioned    Home Living Family/patient expects to be discharged to:: Private residence Living Arrangements:  Spouse/significant other;Children Available Help at Discharge: Family;Available 24 hours/day Type of Home: House Home Access: Stairs to enter Entrance Stairs-Rails: Right Entrance Stairs-Number of Steps: 4   Home Layout: One level Home Equipment: Medical illustrator (2 wheels);Grab bars - tub/shower;Wheelchair - manual      Prior Function Prior Level of Function : Needs assist       Physical Assist : Mobility (physical) Mobility (physical): Bed mobility;Transfers;Gait;Stairs   Mobility Comments: household ambulator using RW, does not drive, uses wheelchair for longer distances ADLs Comments: Assisted by family     Extremity/Trunk Assessment   Upper Extremity Assessment Upper Extremity Assessment: Generalized weakness    Lower Extremity Assessment Lower Extremity Assessment: Generalized weakness    Cervical / Trunk Assessment Cervical / Trunk Assessment: Normal  Communication   Communication Communication: No apparent difficulties  Cognition Arousal: Alert Behavior During Therapy: WFL for tasks assessed/performed Overall Cognitive Status: Within Functional Limits for tasks assessed                                          General Comments      Exercises     Assessment/Plan    PT Assessment Patient needs continued PT services  PT Problem List Decreased strength;Decreased activity tolerance;Decreased balance;Decreased mobility       PT Treatment Interventions DME instruction;Gait training;Stair training;Functional mobility training;Therapeutic activities;Therapeutic exercise;Balance training;Patient/family education    PT Goals (Current goals can be found in the Care Plan section)  Acute Rehab PT Goals Patient Stated Goal: return home with family to assist PT Goal Formulation: With patient Time For Goal Achievement: 04/06/23 Potential to Achieve Goals: Good    Frequency Min 3X/week     Co-evaluation                AM-PAC PT "6 Clicks" Mobility  Outcome Measure Help needed turning from your back to your side while in a flat bed without using bedrails?: A Little Help needed moving from lying on your back to sitting on the side of a flat bed without using bedrails?: A Lot Help needed moving to and from a bed to a chair (including a wheelchair)?: A Lot Help needed standing up from a chair using your arms (e.g., wheelchair or bedside chair)?: A Lot Help needed to walk in hospital room?: A Lot Help needed climbing 3-5 steps with a railing? : Total 6 Click Score: 12    End of Session   Activity Tolerance: Patient tolerated treatment well;Patient limited by fatigue Patient left: in chair;with call bell/phone within reach Nurse Communication: Mobility status PT Visit Diagnosis: Unsteadiness on feet (R26.81);Other abnormalities of gait and mobility (R26.89);Muscle weakness (generalized) (M62.81)    Time: 2440-1027 PT Time Calculation (min) (ACUTE ONLY): 23 min   Charges:   PT Evaluation $PT Eval Moderate Complexity: 1 Mod PT Treatments $Therapeutic Activity: 23-37 mins PT General Charges $$ ACUTE PT VISIT: 1 Visit         12:17 PM, 03/23/23 Ocie Bob, MPT Physical Therapist with [facility] [phone] office [phone] mobile phone

## 2023-03-23 NOTE — TOC Transition Note (Signed)
Transition of Care [facility]) - CM/SW Discharge Note   Patient Details  Name: Garrett Ferguson MRN: [id number] Date of Birth: August 09, 1946  Transition of Care [facility]) CM/SW Contact:  Villa Herb, LCSWA Phone Number: 03/23/2023, 1:59 PM  Clinical Narrative:    TOC updated that PT is recommending SNF unless family can assist pt in the home. CSW spoke with pt and wife, both state they would like for pt to return home with [facility] services. CSW spoke to [place] with [facility] as pt is active with PT/OT, Clifton Custard states Aide can be added also. CSW requested that MD place [facility] orders. TOC singing off.   Final next level of care: Home w Home Health Services Barriers to Discharge: Barriers Resolved   Patient Goals and CMS Choice CMS Medicare.gov Compare Post Acute Care list provided to:: Patient Choice offered to / list presented to : Patient, Spouse  Discharge Placement                         Discharge Plan and Services Additional resources added to the After Visit Summary for                            [facility] Arranged: PT, OT, Nurse's Aide [facility] Agency: [facility] Date [facility] Agency Contacted: 03/23/23   Representative spoke with at [facility] Agency: Clifton Custard  Social Determinants of Health (SDOH) Interventions SDOH Screenings   Food Insecurity: No Food Insecurity (03/22/2023)  Housing: Low Risk  (03/22/2023)  Transportation Needs: No Transportation Needs (03/22/2023)  Utilities: Not At Risk (03/22/2023)  Financial Resource Strain: Medium Risk (04/06/2021)   Received from [facility]  Physical Activity: Insufficiently Active (02/23/2021)   Received from [facility], [facility]  Social Connections: Socially Integrated (02/23/2021)   Received from [facility], [facility]  Stress: No Stress Concern Present (02/23/2021)   Received from [facility], [facility]  Tobacco Use: Low Risk  (03/21/2023)  Health Literacy: Low Risk  (02/23/2021)   Received from [facility], [facility]     Readmission Risk Interventions     No data to display

## 2023-03-23 NOTE — Care Management Important Message (Signed)
Important Message  Patient Details  Name: Garrett Ferguson MRN: [id number] Date of Birth: 1945/11/02   Medicare Important Message Given:  Yes     Corey Harold 03/23/2023, 10:43 AM

## 2023-03-23 NOTE — Progress Notes (Signed)
PROGRESS NOTE  Garrett Ferguson:[id number] DOB: 07/01/1946 DOA: 03/21/2023 PCP: [facility]  Brief History:  77 year old male with a history of bladder cancer, hypertension, restless leg syndrome, lymphedema, peripheral arterial disease presenting with fever and altered mental status with associated dysuria.  History is supplemented by the patient's wife.  Apparently the patient had been complaining of some urinary frequency and dysuria for about 2 days prior to admission.  On the morning of 03/21/2023, the patient's wife left for work and noted that the patient was sitting in his recliner.  He had seemed sleepy at that time.  When she returned from work later in the evening around 4 to 5 PM, the patient was still sitting in his recliner and was lethargic and slow to answer and confused.  He felt hot to the touch.  EMS was activated.  The patient had temperature of 103.4 F per EMS.  He was given a gram of ceftriaxone by EMS and brought to the emergency department.  Apparently the patient had a few episodes of nausea and vomiting on 03/21/2023.  He had denied any headache, chest pain, shortness breath, cough, hemoptysis, abdominal pain. In the ED, the patient was febrile up to 100.4 F.  He was hemodynamically stable with oxygen saturation 97% on room air.  WBC 17.3, hemoglobin 14.3, platelets 217,000.  Sodium 132, potassium 3.8, bicarbonate 22, serum creatinine 0.67.  Lactic acid peaked at 1.8.  UA showed >50 WBC.  Urine and blood cultures were obtained.  The patient was started on ceftriaxone.     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 Multiple. Most narrow of which was proximal most bulbar urethra just distal to membranous urethra>>>no worrisome tumor or lesions   Lymphedema -Patient uses lymphedema pumps at home -follow up [facility] vascular surgery, lymphedema clinic   Restless leg syndrome -Continue pramipexole   Deconditioning -PT>>SNF -pt initially refused, but reconsidered 03/23/23           Family Communication:   spouse updated 03/23/23   Consultants:  none   Code Status:  FULL    DVT Prophylaxis:  [place] Lovenox     Procedures: As Listed in Progress Note Above   Antibiotics: Ceftriaxone 8/7>>           Subjective: Patient denies fevers, chills, headache, chest pain, dyspnea, nausea, vomiting, diarrhea, abdominal pain, dysuria, hematuria, hematochezia, and melena.   Objective: Vitals:   03/22/23 1400 03/22/23 2204 03/23/23 0405 03/23/23 1300  BP: 120/63 (!) 117/51 (!) 155/66 (!) 140/65  Pulse: 70 60 69 74  Resp: 18 20 18 18   Temp: 99 F (37.2 C) 98.9 F (37.2 C) 98.2 F (36.8 C) 98 F (36.7 C)  TempSrc: Oral  Oral Oral  SpO2: 96% 95% 96% 98%  Weight:      Height:        Intake/Output Summary (Last 24 hours) at 03/23/2023 1730 Last data filed at 03/23/2023  1500 Gross per 24 hour  Intake 340 ml  Output 2450 ml  Net -2110 ml   Weight change:  Exam:  General:  Pt is alert, follows commands appropriately, not in acute distress HEENT: No icterus, No thrush, No neck mass, [place]/AT Cardiovascular: RRR, S1/S2, no rubs, no gallops Respiratory: bibasilar crackles.  No wheeze Abdomen: Soft/+BS, non tender, non distended, no guarding Extremities: 1 + LE edema, No lymphangitis, No petechiae, No rashes,  no synovitis   Data Reviewed: I have personally reviewed following labs and imaging studies Basic Metabolic Panel: Recent Labs  Lab 03/21/23 1945 03/22/23 0420 03/23/23 0421  NA 135 132* 131*  K 3.9 3.8 3.9  CL 98 101 101  CO2 25 22 22   GLUCOSE 116* 116* 107*  BUN 21 17 19   CREATININE 0.76 0.67 0.64  CALCIUM 9.3 8.3* 8.2*  MG  --   --  1.8   Liver Function Tests: Recent Labs  Lab 03/21/23 1945  AST 16  ALT 14  ALKPHOS 83  BILITOT 1.6*  PROT 8.6*  ALBUMIN 4.3   No results for input(s): "LIPASE", "AMYLASE" in the last 168 hours. No results for input(s): "AMMONIA" in the last 168 hours. Coagulation Profile: Recent Labs  Lab 03/21/23 1945  INR 1.1   CBC: Recent Labs  Lab 03/21/23 1945 03/22/23 0420 03/23/23 0421  WBC 17.3* 18.9* 14.0*  NEUTROABS 15.6*  --   --   HGB 14.3 12.3* 11.9*  HCT 44.7 38.4* 37.9*  MCV 94.3 94.3 96.2  PLT 217 180 156   Cardiac Enzymes: No results for input(s): "CKTOTAL", "CKMB", "CKMBINDEX", "TROPONINI" in the last 168 hours. BNP: Invalid input(s): "POCBNP" CBG: No results for input(s): "GLUCAP" in the last 168 hours. HbA1C: No results for input(s): "HGBA1C" in the last 72 hours. Urine analysis:    Component Value Date/Time   COLORURINE YELLOW 03/21/2023 1936   APPEARANCEUR CLOUDY (A) 03/21/2023 1936   APPEARANCEUR Cloudy 04/05/2012 1252   LABSPEC 1.013 03/21/2023 1936   LABSPEC 1.015 04/05/2012 1252   PHURINE 7.0 03/21/2023 1936   GLUCOSEU NEGATIVE 03/21/2023 1936   GLUCOSEU Negative 04/05/2012 1252   HGBUR MODERATE (A) 03/21/2023 1936   BILIRUBINUR NEGATIVE 03/21/2023 1936   BILIRUBINUR small (A) 06/21/2022 1903   BILIRUBINUR Negative 04/05/2012 1252   KETONESUR NEGATIVE 03/21/2023 1936   PROTEINUR 100 (A) 03/21/2023 1936   UROBILINOGEN 1.0 06/21/2022 1903   UROBILINOGEN 0.2 04/19/2012 1258   NITRITE POSITIVE (A) 03/21/2023 1936   LEUKOCYTESUR LARGE (A) 03/21/2023 1936   LEUKOCYTESUR 2+ 04/05/2012 1252   Sepsis  Labs: @LABRCNTIP (procalcitonin:4,lacticidven:4) ) Recent Results (from the past 240 hour(s))  Urine Culture     Status: Abnormal (Preliminary result)   Collection Time: 03/21/23  7:36 PM   Specimen: Urine, Random  Result Value Ref Range Status   Specimen Description   Final    URINE, RANDOM Performed at [facility], [address], [place], [place] [address]    Special Requests   Final    NONE Reflexed from [id number] Performed at [facility], [address], [place], [place] [address]    Culture (A)  Final    >=100,000 COLONIES/mL ESCHERICHIA COLI SUSCEPTIBILITIES TO FOLLOW Performed at [facility], [address], [place], [place] [address]    Report Status PENDING  Incomplete  Culture, blood (Routine x 2)     Status: None (Preliminary result)   Collection Time: 03/21/23  7:45 PM   Specimen: BLOOD  Result Value Ref Range Status  Specimen Description BLOOD BLOOD RIGHT ARM  Final   Special Requests   Final    BOTTLES DRAWN AEROBIC AND ANAEROBIC Blood Culture adequate volume   Culture   Final    NO GROWTH 2 DAYS Performed at [facility], [address] [place], [place] [address]    Report Status PENDING  Incomplete  Culture, blood (Routine x 2)     Status: None (Preliminary result)   Collection Time: 03/21/23  8:06 PM   Specimen: BLOOD  Result Value Ref Range Status   Specimen Description BLOOD BLOOD LEFT HAND  Final   Special Requests   Final    BOTTLES DRAWN AEROBIC ONLY Blood Culture adequate volume   Culture   Final    NO GROWTH 2 DAYS Performed at [facility], [address], [place], [place] [address]    Report Status PENDING  Incomplete     Scheduled Meds:  cefdinir  300 mg Oral Q12H   enoxaparin (LOVENOX) injection  40 mg Subcutaneous Q24H   feeding supplement  237 mL Oral TID BM   gabapentin  300 mg Oral BID WC   gabapentin  600 mg Oral QHS   metoprolol succinate  100 mg Oral Daily   multivitamin with minerals  1 tablet Oral Daily   pramipexole  1  mg Oral TID   prednisoLONE acetate  1 drop Right Eye TID   sodium chloride flush  3 mL Intravenous Q12H   Continuous Infusions:  Procedures/Studies: DG Chest Port 1 View  Result Date: 03/21/2023 CLINICAL DATA:  Sepsis EXAM: PORTABLE CHEST 1 VIEW COMPARISON:  04/05/2021 FINDINGS: Cardiomegaly. No confluent opacities, effusions or edema. No acute bony abnormality. IMPRESSION: Cardiomegaly.  No active disease. Electronically Signed   By: Charlett Nose M.D.   On: 03/21/2023 19:56    Catarina Hartshorn, DO  [facility]  If 7PM-7AM, please contact night-coverage [email or web address] Password [email or web address] 03/23/2023, 5:30 PM   LOS: 2 days

## 2023-03-24 DIAGNOSIS — A419 Sepsis, unspecified organism: Secondary | ICD-10-CM | POA: Diagnosis not present

## 2023-03-24 DIAGNOSIS — N3000 Acute cystitis without hematuria: Secondary | ICD-10-CM | POA: Diagnosis not present

## 2023-03-24 DIAGNOSIS — G9340 Encephalopathy, unspecified: Secondary | ICD-10-CM | POA: Diagnosis not present

## 2023-03-24 MED ORDER — CEPHALEXIN 500 MG PO CAPS
500.0000 mg | ORAL_CAPSULE | Freq: Three times a day (TID) | ORAL | Status: DC
  Administered 2023-03-25 – 2023-03-27 (×7): 500 mg via ORAL
  Filled 2023-03-24 (×7): qty 1

## 2023-03-24 NOTE — Progress Notes (Signed)
PROGRESS NOTE  Garrett Ferguson ZOX:[id number] DOB: 1945/08/30 DOA: 03/21/2023 PCP: [facility]  Brief History:  77 year old male with a history of bladder cancer, hypertension, restless leg syndrome, lymphedema, peripheral arterial disease presenting with fever and altered mental status with associated dysuria.  History is supplemented by the patient's wife.  Apparently the patient had been complaining of some urinary frequency and dysuria for about 2 days prior to admission.  On the morning of 03/21/2023, the patient's wife left for work and noted that the patient was sitting in his recliner.  He had seemed sleepy at that time.  When she returned from work later in the evening around 4 to 5 PM, the patient was still sitting in his recliner and was lethargic and slow to answer and confused.  He felt hot to the touch.  EMS was activated.  The patient had temperature of 103.4 F per EMS.  He was given a gram of ceftriaxone by EMS and brought to the emergency department.  Apparently the patient had a few episodes of nausea and vomiting on 03/21/2023.  He had denied any headache, chest pain, shortness breath, cough, hemoptysis, abdominal pain. In the ED, the patient was febrile up to 100.4 F.  He was hemodynamically stable with oxygen saturation 97% on room air.  WBC 17.3, hemoglobin 14.3, platelets 217,000.  Sodium 132, potassium 3.8, bicarbonate 22, serum creatinine 0.67.  Lactic acid peaked at 1.8.  UA showed >50 WBC.  Urine and blood cultures were obtained.  The patient was started on ceftriaxone.     Assessment/Plan:  Severe sepsis -Present on admission -Presented with fever, leukocytosis, and altered mental status -Secondary to UTI -Lactic acid peaked  1.8 -Follow blood and urine cultures -Continue empiric ceftriaxone>>de-escalate to cephalexin -Continue IV fluids>>saline lock   UTI--E coli -Continue ceftriaxone pending urine culture data   Acute metabolic  encephalopathy -Improving>>back to baseline now -Secondary to UTI -Further workup if no improvement   Essential hypertension -Continue metoprolol succinate   Bladder cancer -05/23/2011: Bladder bx: focal urothelial hyperplasia with 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 Multiple. Most narrow of which was proximal most bulbar urethra just distal to membranous urethra>>>no worrisome tumor or lesions   Lymphedema -Patient uses lymphedema pumps at home -follow up [facility] vascular surgery, lymphedema clinic   Restless leg syndrome -Continue pramipexole   Deconditioning -PT>>SNF -pt initially refused, but reconsidered 03/23/23           Family Communication:   spouse updated 03/24/23   Consultants:  none   Code Status:  FULL    DVT Prophylaxis:  [place] Lovenox     Procedures: As Listed in Progress Note Above   Antibiotics: Ceftriaxone 8/7>>8/10 Cephalexin 8/11>>        Subjective: Patient denies fevers, chills, headache, chest pain, dyspnea, nausea, vomiting, diarrhea, abdominal pain, dysuria, hematuria, hematochezia, and melena.   Objective: Vitals:   03/23/23 2300 03/24/23 0025 03/24/23 0802 03/24/23 1436  BP:   (!) 140/74 (!) 144/72  Pulse:  94 (!) 57 (!) 54  Resp:    19  Temp: (!) 100.5 F (38.1 C) 98.2 F (36.8 C)  98 F (36.7 C)  TempSrc: Oral Oral    SpO2:  100%  99%  Weight:      Height:        Intake/Output Summary (Last 24 hours) at 03/24/2023 1814 Last  data filed at 03/24/2023 1700 Gross per 24 hour  Intake 997.22 ml  Output 800 ml  Net 197.22 ml   Weight change:  Exam:  General:  Pt is alert, follows commands appropriately, not in acute distress HEENT: No icterus, No thrush, No neck mass, [place]/AT Cardiovascular: RRR, S1/S2, no rubs, no gallops Respiratory: CTA bilaterally, no wheezing, no crackles, no rhonchi Abdomen: Soft/+BS, non tender, non  distended, no guarding Extremities: 2 + LE edema, No lymphangitis, No petechiae, No rashes, no synovitis   Data Reviewed: I have personally reviewed following labs and imaging studies Basic Metabolic Panel: Recent Labs  Lab 03/21/23 1945 03/22/23 0420 03/23/23 0421 03/24/23 0453  NA 135 132* 131* 131*  K 3.9 3.8 3.9 4.0  CL 98 101 101 98  CO2 25 22 22 22   GLUCOSE 116* 116* 107* 98  BUN 21 17 19 20   CREATININE 0.76 0.67 0.64 0.66  CALCIUM 9.3 8.3* 8.2* 8.1*  MG  --   --  1.8  --    Liver Function Tests: Recent Labs  Lab 03/21/23 1945  AST 16  ALT 14  ALKPHOS 83  BILITOT 1.6*  PROT 8.6*  ALBUMIN 4.3   No results for input(s): "LIPASE", "AMYLASE" in the last 168 hours. No results for input(s): "AMMONIA" in the last 168 hours. Coagulation Profile: Recent Labs  Lab 03/21/23 1945  INR 1.1   CBC: Recent Labs  Lab 03/21/23 1945 03/22/23 0420 03/23/23 0421 03/24/23 0453  WBC 17.3* 18.9* 14.0* 8.5  NEUTROABS 15.6*  --   --   --   HGB 14.3 12.3* 11.9* 11.7*  HCT 44.7 38.4* 37.9* 37.1*  MCV 94.3 94.3 96.2 95.9  PLT 217 180 156 164   Cardiac Enzymes: No results for input(s): "CKTOTAL", "CKMB", "CKMBINDEX", "TROPONINI" in the last 168 hours. BNP: Invalid input(s): "POCBNP" CBG: No results for input(s): "GLUCAP" in the last 168 hours. HbA1C: No results for input(s): "HGBA1C" in the last 72 hours. Urine analysis:    Component Value Date/Time   COLORURINE YELLOW 03/21/2023 1936   APPEARANCEUR CLOUDY (A) 03/21/2023 1936   APPEARANCEUR Cloudy 04/05/2012 1252   LABSPEC 1.013 03/21/2023 1936   LABSPEC 1.015 04/05/2012 1252   PHURINE 7.0 03/21/2023 1936   GLUCOSEU NEGATIVE 03/21/2023 1936   GLUCOSEU Negative 04/05/2012 1252   HGBUR MODERATE (A) 03/21/2023 1936   BILIRUBINUR NEGATIVE 03/21/2023 1936   BILIRUBINUR small (A) 06/21/2022 1903   BILIRUBINUR Negative 04/05/2012 1252   KETONESUR NEGATIVE 03/21/2023 1936   PROTEINUR 100 (A) 03/21/2023 1936    UROBILINOGEN 1.0 06/21/2022 1903   UROBILINOGEN 0.2 04/19/2012 1258   NITRITE POSITIVE (A) 03/21/2023 1936   LEUKOCYTESUR LARGE (A) 03/21/2023 1936   LEUKOCYTESUR 2+ 04/05/2012 1252   Sepsis Labs: @LABRCNTIP (procalcitonin:4,lacticidven:4) ) Recent Results (from the past 240 hour(s))  Urine Culture     Status: Abnormal   Collection Time: 03/21/23  7:36 PM   Specimen: Urine, Random  Result Value Ref Range Status   Specimen Description   Final    URINE, RANDOM Performed at [facility], [address] [place] [address] [place], [place] [address]    Special Requests   Final    NONE Reflexed from [id number] Performed at [facility], [address] [place], [place] [address]    Culture >=100,000 COLONIES/mL ESCHERICHIA COLI (A)  Final   Report Status 03/24/2023 FINAL  Final   Organism ID, Bacteria ESCHERICHIA COLI (A)  Final      Susceptibility   Escherichia coli - MIC*  AMPICILLIN <=2 SENSITIVE Sensitive     CEFAZOLIN <=4 SENSITIVE Sensitive     CEFEPIME <=0.12 SENSITIVE Sensitive     CEFTRIAXONE <=0.25 SENSITIVE Sensitive     CIPROFLOXACIN <=0.25 SENSITIVE Sensitive     GENTAMICIN <=1 SENSITIVE Sensitive     IMIPENEM <=0.25 SENSITIVE Sensitive     NITROFURANTOIN <=16 SENSITIVE Sensitive     TRIMETH/SULFA <=20 SENSITIVE Sensitive     AMPICILLIN/SULBACTAM <=2 SENSITIVE Sensitive     PIP/TAZO <=4 SENSITIVE Sensitive     * >=100,000 COLONIES/mL ESCHERICHIA COLI  Culture, blood (Routine x 2)     Status: None (Preliminary result)   Collection Time: 03/21/23  7:45 PM   Specimen: BLOOD  Result Value Ref Range Status   Specimen Description BLOOD BLOOD RIGHT ARM  Final   Special Requests   Final    BOTTLES DRAWN AEROBIC AND ANAEROBIC Blood Culture adequate volume   Culture   Final    NO GROWTH 3 DAYS Performed at [facility], [address], [place], [place] [address]    Report Status PENDING  Incomplete  Culture, blood (Routine x 2)     Status: None (Preliminary result)   Collection Time:  03/21/23  8:06 PM   Specimen: BLOOD  Result Value Ref Range Status   Specimen Description BLOOD BLOOD LEFT HAND  Final   Special Requests   Final    BOTTLES DRAWN AEROBIC ONLY Blood Culture adequate volume   Culture   Final    NO GROWTH 3 DAYS Performed at [facility], [address], [place], [place] [address]    Report Status PENDING  Incomplete     Scheduled Meds:  [START ON 03/25/2023] cephALEXin  500 mg Oral Q8H   enoxaparin (LOVENOX) injection  40 mg Subcutaneous Q24H   feeding supplement  237 mL Oral TID BM   gabapentin  300 mg Oral BID WC   gabapentin  600 mg Oral QHS   metoprolol succinate  100 mg Oral Daily   multivitamin with minerals  1 tablet Oral Daily   pramipexole  1 mg Oral TID   prednisoLONE acetate  1 drop Right Eye TID   sodium chloride flush  3 mL Intravenous Q12H   Continuous Infusions:  Procedures/Studies: DG Wrist 2 Views Left  Result Date: 03/23/2023 CLINICAL DATA:  Left wrist pain, no injury EXAM: LEFT WRIST - 2 VIEW COMPARISON:  None Available. FINDINGS: No fracture or dislocation is seen. Mild degenerative changes of the radiocarpal joint and 1st carpometacarpal joint. The visualized soft tissues are unremarkable. IMPRESSION: No acute osseous abnormality is seen. Mild degenerative changes. Electronically Signed   By: Charline Bills M.D.   On: 03/23/2023 18:09   DG Chest Port 1 View  Result Date: 03/21/2023 CLINICAL DATA:  Sepsis EXAM: PORTABLE CHEST 1 VIEW COMPARISON:  04/05/2021 FINDINGS: Cardiomegaly. No confluent opacities, effusions or edema. No acute bony abnormality. IMPRESSION: Cardiomegaly.  No active disease. Electronically Signed   By: Charlett Nose M.D.   On: 03/21/2023 19:56    Catarina Hartshorn, DO  [facility]  If 7PM-7AM, please contact night-coverage [email or web address] Password [email or web address] 03/24/2023, 6:14 PM   LOS: 3 days

## 2023-03-24 NOTE — Plan of Care (Signed)

## 2023-03-24 NOTE — NC FL2 (Signed)
[place] MEDICAID FL2 LEVEL OF CARE FORM     IDENTIFICATION  Patient Name: Garrett Ferguson Birthdate: 1946/02/27 Sex: male Admission Date (Current Location): 03/21/2023  [facility] and [place] Number:  [employer] and Address:  [facility],  [address] [place] [address]      Provider Number: [id number]  Attending Physician Name and Address:  Catarina Hartshorn, MD  Relative Name and Phone Number:  Whitaker, Iser (Spouse) [phone] (Home Phone)  [phone] (Mobile)    Current Level of Care: Hospital Recommended Level of Care: Skilled Nursing Facility Prior Approval Number:    Date Approved/Denied:   PASRR Number: [id number]  Discharge Plan: SNF    Current Diagnoses: Patient Active Problem List   Diagnosis Date Noted   Sepsis due to undetermined organism (HCC) 03/22/2023   UTI (urinary tract infection) 03/21/2023   Acute encephalopathy 03/21/2023   Hypertension    Dysrhythmia    Restless legs syndrome (RLS) 08/08/2013   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    Orientation RESPIRATION BLADDER Height & Weight     Self, Situation, Place  Normal Continent Weight: 231 lb 7.7 oz (105 kg) Height:  5\' 11"  (180.3 cm)  BEHAVIORAL SYMPTOMS/MOOD NEUROLOGICAL BOWEL NUTRITION STATUS      Continent Diet  AMBULATORY STATUS COMMUNICATION OF NEEDS Skin   Limited Assist Verbally Other (Comment) (Dry)                       Personal Care Assistance Level of Assistance  Bathing, Feeding, Dressing Bathing Assistance: Limited assistance Feeding assistance: Limited assistance Dressing Assistance: Limited assistance     Functional Limitations Info  Sight, Hearing Sight Info: Impaired (Glasses)        SPECIAL CARE FACTORS FREQUENCY  PT (By licensed PT), OT (By licensed OT)     PT Frequency: 5 X per week OT Frequency: 5 X per week            Contractures Contractures Info: Not present    Additional Factors Info  Code  Status, Allergies Code Status Info: Full Allergies Info: Sulfamethoxazole-trimethoprim, Diclofenac Sodium, Latex, Levofloxacin, Penicillins           Current Medications (03/24/2023):  This is the current hospital active medication list Current Facility-Administered Medications  Medication Dose Route Frequency Provider Last Rate Last Admin   acetaminophen (TYLENOL) tablet 650 mg  650 mg Oral Q6H PRN Opyd, Lavone Neri, MD   650 mg at 03/23/23 2114   Or   acetaminophen (TYLENOL) suppository 650 mg  650 mg Rectal Q6H PRN Opyd, Lavone Neri, MD       cefTRIAXone (ROCEPHIN) 1 g in sodium chloride 0.9 % 100 mL IVPB  1 g Intravenous Q24H Catarina Hartshorn, MD 200 mL/hr at 03/23/23 1905 1 g at 03/23/23 1905   enoxaparin (LOVENOX) injection 40 mg  40 mg Subcutaneous Q24H Opyd, Lavone Neri, MD   40 mg at 03/24/23 0803   feeding supplement (ENSURE ENLIVE / ENSURE PLUS) liquid 237 mL  237 mL Oral TID BM Catarina Hartshorn, MD   237 mL at 03/24/23 0808   gabapentin (NEURONTIN) capsule 300 mg  300 mg Oral BID WC Opyd, Lavone Neri, MD   300 mg at 03/24/23 0802   gabapentin (NEURONTIN) capsule 600 mg  600 mg Oral QHS Opyd, Lavone Neri, MD   600 mg at 03/23/23 2114   hydrALAZINE (APRESOLINE) tablet 25 mg  25 mg Oral Q6H PRN Opyd, Lavone Neri, MD  25 mg at 03/22/23 0133   metoprolol succinate (TOPROL-XL) 24 hr tablet 100 mg  100 mg Oral Daily Opyd, Lavone Neri, MD   100 mg at 03/24/23 0802   multivitamin with minerals tablet 1 tablet  1 tablet Oral Daily Tat, Courvoisier, MD   1 tablet at 03/24/23 0802   naphazoline-glycerin (CLEAR EYES REDNESS) ophth solution 2 drop  2 drop Left Eye QID PRN Tat, Onalee Hua, MD       polyethylene glycol (MIRALAX / GLYCOLAX) packet 17 g  17 g Oral Daily PRN Opyd, Lavone Neri, MD       pramipexole (MIRAPEX) tablet 1 mg  1 mg Oral TID Opyd, Lavone Neri, MD   1 mg at 03/24/23 0802   prednisoLONE acetate (PRED FORTE) 1 % ophthalmic suspension 1 drop  1 drop Right Eye TID Tat, Blaize, MD   1 drop at 03/24/23 0809   sodium  chloride flush (NS) 0.9 % injection 3 mL  3 mL Intravenous Q12H Opyd, Lavone Neri, MD   3 mL at 03/24/23 0813     Discharge Medications: Please see discharge summary for a list of discharge medications.  Relevant Imaging Results:  Relevant Lab Results:   Additional Information [id number]  Catalina Gravel, LCSW

## 2023-03-24 NOTE — TOC Transition Note (Addendum)
Transition of Care [facility]) - CM/SW Discharge Note   Patient Details  Name: Garrett Ferguson MRN: [id number] Date of Birth: Mar 15, 1946  [facility] CM/SW Contact:  Catalina Gravel, LCSW Phone Number: 03/24/2023, 11:38 AM   Clinical Narrative:     Pt and spouse determined SNF is discharge choice to have some rehab. Spouse states they need a facility in [place] near their home, as she cannot travel. Spouse requested [facility] as first choice and [facility] as second. CSW requested bed offers and completed PASSR [id number] A and FLT. Will need Auth pt not located on Nav[facility]. DC expected Monday. TOC to follow.  Final next level of care: Skilled Nursing Facility Barriers to Discharge: Continued Medical Work up   Patient Goals and CMS Choice CMS Medicare.gov Compare Post Acute Care list provided to:: Patient Choice offered to / list presented to : Patient, Spouse  Discharge Placement                         Discharge Plan and Services Additional resources added to the After Visit Summary for                            [facility] Arranged: PT, OT, Nurse's Aide [facility] Agency: [facility] Date [facility] Agency Contacted: 03/23/23   Representative spoke with at [facility] Agency: Clifton Custard  Social Determinants of Health (SDOH) Interventions SDOH Screenings   Food Insecurity: No Food Insecurity (03/22/2023)  Housing: Low Risk  (03/22/2023)  Transportation Needs: No Transportation Needs (03/22/2023)  Utilities: Not At Risk (03/22/2023)  Financial Resource Strain: Medium Risk (04/06/2021)   Received from [facility], [facility]  Physical Activity: Insufficiently Active (02/23/2021)   Received from [facility], [facility]  Social Connections: Socially Integrated (02/23/2021)   Received from [facility], [facility]  Stress: No Stress Concern Present (02/23/2021)   Received from [facility],  [facility]  Tobacco Use: Low Risk  (03/21/2023)  Health Literacy: Low  Risk  (02/23/2021)   Received from [facility], [facility]     Readmission Risk Interventions     No data to display

## 2023-03-24 NOTE — Progress Notes (Signed)
Pt sleeping when arrived to room woke easily to voice. Pt denies any burning or dysuria with urination at this present time. Pt states overall feeling fatigued and states he did not sleep well last night. Tolerated PO med's whole well. Fresh water provided on bedside table. Patient states plans to go to SNF for rehab on Monday.

## 2023-03-25 DIAGNOSIS — G9340 Encephalopathy, unspecified: Secondary | ICD-10-CM | POA: Diagnosis not present

## 2023-03-25 DIAGNOSIS — A419 Sepsis, unspecified organism: Secondary | ICD-10-CM | POA: Diagnosis not present

## 2023-03-25 DIAGNOSIS — N3000 Acute cystitis without hematuria: Secondary | ICD-10-CM | POA: Diagnosis not present

## 2023-03-25 NOTE — Progress Notes (Signed)
Pt took small steps with walker with moderate assist to chair. Pt tolerated fairly well.

## 2023-03-25 NOTE — Progress Notes (Signed)
PROGRESS NOTE  Garrett Ferguson VVO:[id number] DOB: 1946/04/16 DOA: 03/21/2023 PCP: [facility]  Brief History:  77 year old male with a history of bladder cancer, hypertension, restless leg syndrome, lymphedema, peripheral arterial disease presenting with fever and altered mental status with associated dysuria.  History is supplemented by the patient's wife.  Apparently the patient had been complaining of some urinary frequency and dysuria for about 2 days prior to admission.  On the morning of 03/21/2023, the patient's wife left for work and noted that the patient was sitting in his recliner.  He had seemed sleepy at that time.  When she returned from work later in the evening around 4 to 5 PM, the patient was still sitting in his recliner and was lethargic and slow to answer and confused.  He felt hot to the touch.  EMS was activated.  The patient had temperature of 103.4 F per EMS.  He was given a gram of ceftriaxone by EMS and brought to the emergency department.  Apparently the patient had a few episodes of nausea and vomiting on 03/21/2023.  He had denied any headache, chest pain, shortness breath, cough, hemoptysis, abdominal pain. In the ED, the patient was febrile up to 100.4 F.  He was hemodynamically stable with oxygen saturation 97% on room air.  WBC 17.3, hemoglobin 14.3, platelets 217,000.  Sodium 132, potassium 3.8, bicarbonate 22, serum creatinine 0.67.  Lactic acid peaked at 1.8.  UA showed >50 WBC.  Urine and blood cultures were obtained.  The patient was started on ceftriaxone.     Assessment/Plan:  Severe sepsis -Present on admission -Presented with fever, leukocytosis, and altered mental status -Secondary to UTI -Lactic acid peaked  1.8 -Follow blood and urine cultures -Continue empiric ceftriaxone>>de-escalate to cephalexin -Continue IV fluids>>saline lock   UTI--E coli -initially on ceftriaxone pending urine culture data -now on cephalxin   Acute  metabolic encephalopathy -Improving>>back to baseline now -Secondary to UTI -Further workup if no improvement   Essential hypertension -Continue metoprolol succinate   Bladder cancer -05/23/2011: Bladder bx: focal urothelial hyperplasia with 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 Multiple. Most narrow of which was proximal most bulbar urethra just distal to membranous urethra>>>no worrisome tumor or lesions   Lymphedema -Patient uses lymphedema pumps at home -follow up [facility] vascular surgery, lymphedema clinic   Restless leg syndrome -Continue pramipexole   Deconditioning -PT>>SNF -pt initially refused, but reconsidered 03/23/23       Family Communication:   spouse at bedside 8/11  Consultants:  none  Code Status:  FULL   DVT Prophylaxis:  [place] Lovenox   Procedures: As Listed in Progress Note Above  Antibiotics: Ceftriaxone 8/7>>8/10 Cephalexin 8/11>>          Subjective: Patient denies fevers, chills, headache, chest pain, dyspnea, nausea, vomiting, diarrhea, abdominal pain, dysuria, hematuria, hematochezia, and melena.   Objective: Vitals:   03/24/23 1436 03/24/23 2134 03/25/23 0440 03/25/23 1443  BP: (!) 144/72 (!) 175/74 (!) 159/71 (!) 145/70  Pulse: (!) 54 60 (!) 54 (!) 50  Resp: 19 16 16 18   Temp: 98 F (36.7 C) 98.1 F (36.7 C) 98.4 F (36.9 C) 98.3 F (36.8 C)  TempSrc:  Oral Oral   SpO2: 99% 98% 97% 100%  Weight:      Height:        Intake/Output Summary (Last 24 hours) at 03/25/2023  1627 Last data filed at 03/25/2023 1100 Gross per 24 hour  Intake 557.22 ml  Output 1300 ml  Net -742.78 ml   Weight change:  Exam:  General:  Pt is alert, follows commands appropriately, not in acute distress HEENT: No icterus, No thrush, No neck mass, [place]/AT Cardiovascular: RRR, S1/S2, no rubs, no gallops Respiratory: CTA bilaterally, no wheezing, no  crackles, no rhonchi Abdomen: Soft/+BS, non tender, non distended, no guarding Extremities: 1 + LE edema, No lymphangitis, No petechiae, No rashes, no synovitis   Data Reviewed: I have personally reviewed following labs and imaging studies Basic Metabolic Panel: Recent Labs  Lab 03/21/23 1945 03/22/23 0420 03/23/23 0421 03/24/23 0453  NA 135 132* 131* 131*  K 3.9 3.8 3.9 4.0  CL 98 101 101 98  CO2 25 22 22 22   GLUCOSE 116* 116* 107* 98  BUN 21 17 19 20   CREATININE 0.76 0.67 0.64 0.66  CALCIUM 9.3 8.3* 8.2* 8.1*  MG  --   --  1.8  --    Liver Function Tests: Recent Labs  Lab 03/21/23 1945  AST 16  ALT 14  ALKPHOS 83  BILITOT 1.6*  PROT 8.6*  ALBUMIN 4.3   No results for input(s): "LIPASE", "AMYLASE" in the last 168 hours. No results for input(s): "AMMONIA" in the last 168 hours. Coagulation Profile: Recent Labs  Lab 03/21/23 1945  INR 1.1   CBC: Recent Labs  Lab 03/21/23 1945 03/22/23 0420 03/23/23 0421 03/24/23 0453  WBC 17.3* 18.9* 14.0* 8.5  NEUTROABS 15.6*  --   --   --   HGB 14.3 12.3* 11.9* 11.7*  HCT 44.7 38.4* 37.9* 37.1*  MCV 94.3 94.3 96.2 95.9  PLT 217 180 156 164   Cardiac Enzymes: No results for input(s): "CKTOTAL", "CKMB", "CKMBINDEX", "TROPONINI" in the last 168 hours. BNP: Invalid input(s): "POCBNP" CBG: No results for input(s): "GLUCAP" in the last 168 hours. HbA1C: No results for input(s): "HGBA1C" in the last 72 hours. Urine analysis:    Component Value Date/Time   COLORURINE YELLOW 03/21/2023 1936   APPEARANCEUR CLOUDY (A) 03/21/2023 1936   APPEARANCEUR Cloudy 04/05/2012 1252   LABSPEC 1.013 03/21/2023 1936   LABSPEC 1.015 04/05/2012 1252   PHURINE 7.0 03/21/2023 1936   GLUCOSEU NEGATIVE 03/21/2023 1936   GLUCOSEU Negative 04/05/2012 1252   HGBUR MODERATE (A) 03/21/2023 1936   BILIRUBINUR NEGATIVE 03/21/2023 1936   BILIRUBINUR small (A) 06/21/2022 1903   BILIRUBINUR Negative 04/05/2012 1252   KETONESUR NEGATIVE  03/21/2023 1936   PROTEINUR 100 (A) 03/21/2023 1936   UROBILINOGEN 1.0 06/21/2022 1903   UROBILINOGEN 0.2 04/19/2012 1258   NITRITE POSITIVE (A) 03/21/2023 1936   LEUKOCYTESUR LARGE (A) 03/21/2023 1936   LEUKOCYTESUR 2+ 04/05/2012 1252   Sepsis Labs: @LABRCNTIP (procalcitonin:4,lacticidven:4) ) Recent Results (from the past 240 hour(s))  Urine Culture     Status: Abnormal   Collection Time: 03/21/23  7:36 PM   Specimen: Urine, Random  Result Value Ref Range Status   Specimen Description   Final    URINE, RANDOM Performed at [facility], [address] [place], [place] [address]    Special Requests   Final    NONE Reflexed from [id number] Performed at [facility], [address], [place], [place] [address]    Culture >=100,000 COLONIES/mL ESCHERICHIA COLI (A)  Final   Report Status 03/24/2023 FINAL  Final   Organism ID, Bacteria ESCHERICHIA COLI (A)  Final      Susceptibility   Escherichia coli - MIC*  AMPICILLIN <=2 SENSITIVE Sensitive     CEFAZOLIN <=4 SENSITIVE Sensitive     CEFEPIME <=0.12 SENSITIVE Sensitive     CEFTRIAXONE <=0.25 SENSITIVE Sensitive     CIPROFLOXACIN <=0.25 SENSITIVE Sensitive     GENTAMICIN <=1 SENSITIVE Sensitive     IMIPENEM <=0.25 SENSITIVE Sensitive     NITROFURANTOIN <=16 SENSITIVE Sensitive     TRIMETH/SULFA <=20 SENSITIVE Sensitive     AMPICILLIN/SULBACTAM <=2 SENSITIVE Sensitive     PIP/TAZO <=4 SENSITIVE Sensitive     * >=100,000 COLONIES/mL ESCHERICHIA COLI  Culture, blood (Routine x 2)     Status: None (Preliminary result)   Collection Time: 03/21/23  7:45 PM   Specimen: BLOOD  Result Value Ref Range Status   Specimen Description BLOOD BLOOD RIGHT ARM  Final   Special Requests   Final    BOTTLES DRAWN AEROBIC AND ANAEROBIC Blood Culture adequate volume   Culture   Final    NO GROWTH 4 DAYS Performed at [facility], [address], [place], [place] [address]    Report Status PENDING  Incomplete  Culture, blood (Routine x 2)      Status: None (Preliminary result)   Collection Time: 03/21/23  8:06 PM   Specimen: BLOOD  Result Value Ref Range Status   Specimen Description BLOOD BLOOD LEFT HAND  Final   Special Requests   Final    BOTTLES DRAWN AEROBIC ONLY Blood Culture adequate volume   Culture   Final    NO GROWTH 4 DAYS Performed at [facility], [address], [place], [place] [address]    Report Status PENDING  Incomplete     Scheduled Meds:  cephALEXin  500 mg Oral Q8H   enoxaparin (LOVENOX) injection  40 mg Subcutaneous Q24H   feeding supplement  237 mL Oral TID BM   gabapentin  300 mg Oral BID WC   gabapentin  600 mg Oral QHS   metoprolol succinate  100 mg Oral Daily   multivitamin with minerals  1 tablet Oral Daily   pramipexole  1 mg Oral TID   prednisoLONE acetate  1 drop Right Eye TID   sodium chloride flush  3 mL Intravenous Q12H   Continuous Infusions:  Procedures/Studies: DG Wrist 2 Views Left  Result Date: 03/23/2023 CLINICAL DATA:  Left wrist pain, no injury EXAM: LEFT WRIST - 2 VIEW COMPARISON:  None Available. FINDINGS: No fracture or dislocation is seen. Mild degenerative changes of the radiocarpal joint and 1st carpometacarpal joint. The visualized soft tissues are unremarkable. IMPRESSION: No acute osseous abnormality is seen. Mild degenerative changes. Electronically Signed   By: Charline Bills M.D.   On: 03/23/2023 18:09   DG Chest Port 1 View  Result Date: 03/21/2023 CLINICAL DATA:  Sepsis EXAM: PORTABLE CHEST 1 VIEW COMPARISON:  04/05/2021 FINDINGS: Cardiomegaly. No confluent opacities, effusions or edema. No acute bony abnormality. IMPRESSION: Cardiomegaly.  No active disease. Electronically Signed   By: Charlett Nose M.D.   On: 03/21/2023 19:56    Catarina Hartshorn, DO  [facility]  If 7PM-7AM, please contact night-coverage [email or web address] Password [facility] 03/25/2023, 4:27 PM   LOS: 4 days

## 2023-03-26 DIAGNOSIS — N3001 Acute cystitis with hematuria: Secondary | ICD-10-CM

## 2023-03-26 DIAGNOSIS — G9340 Encephalopathy, unspecified: Secondary | ICD-10-CM | POA: Diagnosis not present

## 2023-03-26 DIAGNOSIS — N3000 Acute cystitis without hematuria: Secondary | ICD-10-CM | POA: Diagnosis not present

## 2023-03-26 NOTE — Progress Notes (Signed)
PROGRESS NOTE  Garrett Ferguson FAO:[id number] DOB: 1945-10-01 DOA: 03/21/2023 PCP: [facility]  Brief History:  77 year old male with a history of bladder cancer, hypertension, restless leg syndrome, lymphedema, peripheral arterial disease presenting with fever and altered mental status with associated dysuria.  History is supplemented by the patient's wife.  Apparently the patient had been complaining of some urinary frequency and dysuria for about 2 days prior to admission.  On the morning of 03/21/2023, the patient's wife left for work and noted that the patient was sitting in his recliner.  He had seemed sleepy at that time.  When she returned from work later in the evening around 4 to 5 PM, the patient was still sitting in his recliner and was lethargic and slow to answer and confused.  He felt hot to the touch.  EMS was activated.  The patient had temperature of 103.4 F per EMS.  He was given a gram of ceftriaxone by EMS and brought to the emergency department.  Apparently the patient had a few episodes of nausea and vomiting on 03/21/2023.  He had denied any headache, chest pain, shortness breath, cough, hemoptysis, abdominal pain. In the ED, the patient was febrile up to 100.4 F.  He was hemodynamically stable with oxygen saturation 97% on room air.  WBC 17.3, hemoglobin 14.3, platelets 217,000.  Sodium 132, potassium 3.8, bicarbonate 22, serum creatinine 0.67.  Lactic acid peaked at 1.8.  UA showed >50 WBC.  Urine and blood cultures were obtained.  The patient was started on ceftriaxone.     Assessment/Plan:  Severe sepsis -Present on admission -Presented with fever, leukocytosis, and altered mental status -Secondary to UTI -Lactic acid peaked  1.8 -Follow blood and urine cultures -Continue empiric ceftriaxone>>de-escalate to cephalexin -Continue IV fluids>>saline lock -sepsis physiology resolved   UTI--E coli -initially on ceftriaxone pending urine culture data -now  on cephalexin--last day on 03/27/23   Acute metabolic encephalopathy -Improving>>back to baseline now -Secondary to UTI -Further workup if no improvement   Essential hypertension -Continue metoprolol succinate   Bladder cancer -05/23/2011: Bladder bx: focal urothelial hyperplasia with 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 Multiple. Most narrow of which was proximal most bulbar urethra just distal to membranous urethra>>>no worrisome tumor or lesions   Lymphedema -Patient uses lymphedema pumps at home -follow up [facility] vascular surgery, lymphedema clinic   Restless leg syndrome -Continue pramipexole   Deconditioning -PT>>SNF -pt initially refused, but reconsidered 03/23/23         Family Communication:   spouse at bedside 8/12   Consultants:  none   Code Status:  FULL    DVT Prophylaxis:  [place] Lovenox     Procedures: As Listed in Progress Note Above   Antibiotics: Ceftriaxone 8/7>>8/10 Cephalexin 8/11>>        Subjective:  Patient denies fevers, chills, headache, chest pain, dyspnea, nausea, vomiting, diarrhea, abdominal pain, dysuria, hematuria, hematochezia, and melena.  Objective: Vitals:   03/25/23 2200 03/26/23 0501 03/26/23 1106 03/26/23 1321  BP: (!) 180/75 (!) 180/78 120/63 (!) 116/59  Pulse: (!) 59 65 (!) 49 (!) 52  Resp: 16 16  18   Temp: 98.3 F (36.8 C) 98.3 F (36.8 C)  98.2 F (36.8 C)  TempSrc: Oral Oral    SpO2: 98% 98% 98% 98%  Weight:      Height:  Intake/Output Summary (Last 24 hours) at 03/26/2023 1632 Last data filed at 03/26/2023 1321 Gross per 24 hour  Intake 780 ml  Output 2000 ml  Net -1220 ml   Weight change:  Exam:  General:  Pt is alert, follows commands appropriately, not in acute distress HEENT: No icterus, No thrush, No neck mass, [place]/AT Cardiovascular: RRR, S1/S2, no rubs, no gallops Respiratory: CTA  bilaterally, no wheezing, no crackles, no rhonchi Abdomen: Soft/+BS, non tender, non distended, no guarding Extremities: 1 +LE edema, No lymphangitis, No petechiae, No rashes, no synovitis   Data Reviewed: I have personally reviewed following labs and imaging studies Basic Metabolic Panel: Recent Labs  Lab 03/21/23 1945 03/22/23 0420 03/23/23 0421 03/24/23 0453  NA 135 132* 131* 131*  K 3.9 3.8 3.9 4.0  CL 98 101 101 98  CO2 25 22 22 22   GLUCOSE 116* 116* 107* 98  BUN 21 17 19 20   CREATININE 0.76 0.67 0.64 0.66  CALCIUM 9.3 8.3* 8.2* 8.1*  MG  --   --  1.8  --    Liver Function Tests: Recent Labs  Lab 03/21/23 1945  AST 16  ALT 14  ALKPHOS 83  BILITOT 1.6*  PROT 8.6*  ALBUMIN 4.3   No results for input(s): "LIPASE", "AMYLASE" in the last 168 hours. No results for input(s): "AMMONIA" in the last 168 hours. Coagulation Profile: Recent Labs  Lab 03/21/23 1945  INR 1.1   CBC: Recent Labs  Lab 03/21/23 1945 03/22/23 0420 03/23/23 0421 03/24/23 0453  WBC 17.3* 18.9* 14.0* 8.5  NEUTROABS 15.6*  --   --   --   HGB 14.3 12.3* 11.9* 11.7*  HCT 44.7 38.4* 37.9* 37.1*  MCV 94.3 94.3 96.2 95.9  PLT 217 180 156 164   Cardiac Enzymes: No results for input(s): "CKTOTAL", "CKMB", "CKMBINDEX", "TROPONINI" in the last 168 hours. BNP: Invalid input(s): "POCBNP" CBG: No results for input(s): "GLUCAP" in the last 168 hours. HbA1C: No results for input(s): "HGBA1C" in the last 72 hours. Urine analysis:    Component Value Date/Time   COLORURINE YELLOW 03/21/2023 1936   APPEARANCEUR CLOUDY (A) 03/21/2023 1936   APPEARANCEUR Cloudy 04/05/2012 1252   LABSPEC 1.013 03/21/2023 1936   LABSPEC 1.015 04/05/2012 1252   PHURINE 7.0 03/21/2023 1936   GLUCOSEU NEGATIVE 03/21/2023 1936   GLUCOSEU Negative 04/05/2012 1252   HGBUR MODERATE (A) 03/21/2023 1936   BILIRUBINUR NEGATIVE 03/21/2023 1936   BILIRUBINUR small (A) 06/21/2022 1903   BILIRUBINUR Negative 04/05/2012 1252    KETONESUR NEGATIVE 03/21/2023 1936   PROTEINUR 100 (A) 03/21/2023 1936   UROBILINOGEN 1.0 06/21/2022 1903   UROBILINOGEN 0.2 04/19/2012 1258   NITRITE POSITIVE (A) 03/21/2023 1936   LEUKOCYTESUR LARGE (A) 03/21/2023 1936   LEUKOCYTESUR 2+ 04/05/2012 1252   Sepsis Labs: @LABRCNTIP (procalcitonin:4,lacticidven:4) ) Recent Results (from the past 240 hour(s))  Urine Culture     Status: Abnormal   Collection Time: 03/21/23  7:36 PM   Specimen: Urine, Random  Result Value Ref Range Status   Specimen Description   Final    URINE, RANDOM Performed at [facility], [address], [place], [place] [address]    Special Requests   Final    NONE Reflexed from [id number] Performed at [facility], [address], [place], [place] [address]    Culture >=100,000 COLONIES/mL ESCHERICHIA COLI (A)  Final   Report Status 03/24/2023 FINAL  Final   Organism ID, Bacteria ESCHERICHIA COLI (A)  Final      Susceptibility  Escherichia coli - MIC*    AMPICILLIN <=2 SENSITIVE Sensitive     CEFAZOLIN <=4 SENSITIVE Sensitive     CEFEPIME <=0.12 SENSITIVE Sensitive     CEFTRIAXONE <=0.25 SENSITIVE Sensitive     CIPROFLOXACIN <=0.25 SENSITIVE Sensitive     GENTAMICIN <=1 SENSITIVE Sensitive     IMIPENEM <=0.25 SENSITIVE Sensitive     NITROFURANTOIN <=16 SENSITIVE Sensitive     TRIMETH/SULFA <=20 SENSITIVE Sensitive     AMPICILLIN/SULBACTAM <=2 SENSITIVE Sensitive     PIP/TAZO <=4 SENSITIVE Sensitive     * >=100,000 COLONIES/mL ESCHERICHIA COLI  Culture, blood (Routine x 2)     Status: None   Collection Time: 03/21/23  7:45 PM   Specimen: BLOOD  Result Value Ref Range Status   Specimen Description BLOOD BLOOD RIGHT ARM  Final   Special Requests   Final    BOTTLES DRAWN AEROBIC AND ANAEROBIC Blood Culture adequate volume   Culture   Final    NO GROWTH 5 DAYS Performed at [facility], [address] [place], [place] [address]    Report Status 03/26/2023 FINAL  Final  Culture, blood (Routine x 2)      Status: None   Collection Time: 03/21/23  8:06 PM   Specimen: BLOOD  Result Value Ref Range Status   Specimen Description BLOOD BLOOD LEFT HAND  Final   Special Requests   Final    BOTTLES DRAWN AEROBIC ONLY Blood Culture adequate volume   Culture   Final    NO GROWTH 5 DAYS Performed at [facility], [address], [place], [place] [address]    Report Status 03/26/2023 FINAL  Final     Scheduled Meds:  cephALEXin  500 mg Oral Q8H   enoxaparin (LOVENOX) injection  40 mg Subcutaneous Q24H   feeding supplement  237 mL Oral TID BM   gabapentin  300 mg Oral BID WC   gabapentin  600 mg Oral QHS   metoprolol succinate  100 mg Oral Daily   multivitamin with minerals  1 tablet Oral Daily   pramipexole  1 mg Oral TID   prednisoLONE acetate  1 drop Right Eye TID   sodium chloride flush  3 mL Intravenous Q12H   Continuous Infusions:  Procedures/Studies: DG Wrist 2 Views Left  Result Date: 03/23/2023 CLINICAL DATA:  Left wrist pain, no injury EXAM: LEFT WRIST - 2 VIEW COMPARISON:  None Available. FINDINGS: No fracture or dislocation is seen. Mild degenerative changes of the radiocarpal joint and 1st carpometacarpal joint. The visualized soft tissues are unremarkable. IMPRESSION: No acute osseous abnormality is seen. Mild degenerative changes. Electronically Signed   By: Charline Bills M.D.   On: 03/23/2023 18:09   DG Chest Port 1 View  Result Date: 03/21/2023 CLINICAL DATA:  Sepsis EXAM: PORTABLE CHEST 1 VIEW COMPARISON:  04/05/2021 FINDINGS: Cardiomegaly. No confluent opacities, effusions or edema. No acute bony abnormality. IMPRESSION: Cardiomegaly.  No active disease. Electronically Signed   By: Charlett Nose M.D.   On: 03/21/2023 19:56    Catarina Hartshorn, DO  [facility]  If 7PM-7AM, please contact night-coverage [email or web address] Password [facility] 03/26/2023, 4:32 PM   LOS: 5 days

## 2023-03-26 NOTE — Plan of Care (Signed)

## 2023-03-26 NOTE — Progress Notes (Signed)
Physical Therapy Treatment Patient Details Name: Garrett Ferguson MRN: [id number] DOB: [birth year] Today's Date: 03/26/2023   History of Present Illness Garrett Ferguson is a pleasant 77 y.o. male with medical history significant for hypertension, restless leg syndrome, and bladder cancer, now presenting for evaluation of dysuria, fever, and confusion.     Patient reports that he has been experiencing dysuria as well as bilateral flank pain, and then developed a fever this morning.  His wife noted that he seemed to be confused.  He denies cough, shortness of breath, or chest pain.     He was febrile and tachycardic with EMS, and they administered 1 g of Rocephin prior to arrival in the ED.    PT Comments  Pt presented in high spirits and tolerated treatment well. Swelling of legs observed to have decreased since last visit. Pt required min assist to perform bed mobility, and mod assist to transfer from bed to chair using RW.  Length of ambulation limited due to LE weakness and fatigue. Pt tolerated sitting upright in chair after therapy session. Patient will benefit from continued skilled physical therapy in hospital and recommended venue below to increase strength, balance, endurance for safe ADLs and gait.    If plan is discharge home, recommend the following: A lot of help with bathing/dressing/bathroom;A lot of help with walking and/or transfers;Help with stairs or ramp for entrance;Assistance with cooking/housework   Can travel by private vehicle     No  Equipment Recommendations  None recommended by PT    Recommendations for Other Services       Precautions / Restrictions Precautions Precautions: Fall Restrictions Weight Bearing Restrictions: No     Mobility  Bed Mobility Overal bed mobility: Needs Assistance Bed Mobility: Supine to Sit     Supine to sit: Min assist, HOB elevated     General bed mobility comments: increased time, labored movement with HOB elevated     Transfers Overall transfer level: Needs assistance Equipment used: Rolling walker (2 wheels) Transfers: Sit to/from Stand, Bed to chair/wheelchair/BSC Sit to Stand: Mod assist   Step pivot transfers: Mod assist, Min assist       General transfer comment: labored movement with use of RW    Ambulation/Gait Ambulation/Gait assistance: Mod assist Gait Distance (Feet): 4 Feet (Steps taken to transfer from bed to chair) Assistive device: Rolling walker (2 wheels) Gait Pattern/deviations: Decreased step length - right, Decreased step length - left, Decreased stride length, Knees buckling Gait velocity: slow     General Gait Details: limited to a few slow labored side steps with RW   Stairs             Wheelchair Mobility     Tilt Bed    Modified Rankin (Stroke Patients Only)       Balance Overall balance assessment: Needs assistance Sitting-balance support: Feet supported, No upper extremity supported Sitting balance-Leahy Scale: Fair Sitting balance - Comments: fair/good seated at EOB   Standing balance support: Reliant on assistive device for balance, During functional activity, Bilateral upper extremity supported Standing balance-Leahy Scale: Poor Standing balance comment: using RW                            Cognition Arousal: Alert Behavior During Therapy: WFL for tasks assessed/performed Overall Cognitive Status: Within Functional Limits for tasks assessed  Exercises General Exercises - Lower Extremity Ankle Circles/Pumps: AROM, Seated, Strengthening, Right, Left, 10 reps Long Arc Quad: AROM, Seated, Strengthening, Right, Left, 10 reps Hip Flexion/Marching: AROM, Seated, Strengthening, Right, Left, 10 reps Toe Raises: AROM, Seated, Strengthening, Right, Left, 10 reps Heel Raises: AROM, Seated, Right, Strengthening, Left, 10 reps    General Comments        Pertinent  Vitals/Pain Pain Assessment Pain Assessment: No/denies pain    Home Living                          Prior Function            PT Goals (current goals can now be found in the care plan section) Acute Rehab PT Goals Patient Stated Goal: return home with family to assist PT Goal Formulation: With patient Time For Goal Achievement: 04/06/23 Potential to Achieve Goals: Good Progress towards PT goals: Progressing toward goals    Frequency    Min 3X/week      PT Plan      Co-evaluation              AM-PAC PT "6 Clicks" Mobility   Outcome Measure  Help needed turning from your back to your side while in a flat bed without using bedrails?: A Little Help needed moving from lying on your back to sitting on the side of a flat bed without using bedrails?: A Lot Help needed moving to and from a bed to a chair (including a wheelchair)?: A Lot Help needed standing up from a chair using your arms (e.g., wheelchair or bedside chair)?: A Lot Help needed to walk in hospital room?: A Lot Help needed climbing 3-5 steps with a railing? : Total 6 Click Score: 12    End of Session Equipment Utilized During Treatment: Gait belt Activity Tolerance: Patient tolerated treatment well;Patient limited by fatigue Patient left: in chair;with call bell/phone within reach Nurse Communication: Mobility status PT Visit Diagnosis: Unsteadiness on feet (R26.81);Other abnormalities of gait and mobility (R26.89);Muscle weakness (generalized) (M62.81)     Time: 1610-9604 PT Time Calculation (min) (ACUTE ONLY): 23 min  Charges:    $Therapeutic Exercise: 8-22 mins $Therapeutic Activity: 8-22 mins PT General Charges $$ ACUTE PT VISIT: 1 Visit           Malasha Kleppe SPT [place], DPT Program

## 2023-03-27 DIAGNOSIS — A4151 Sepsis due to Escherichia coli [E. coli]: Secondary | ICD-10-CM

## 2023-03-27 DIAGNOSIS — N3000 Acute cystitis without hematuria: Secondary | ICD-10-CM | POA: Diagnosis not present

## 2023-03-27 NOTE — Progress Notes (Signed)
Mobility Specialist Progress Note:    03/27/23 1030  Mobility  Activity Transferred from bed to chair  Level of Assistance Maximum assist, patient does 25-49%  Assistive Device Front wheel walker  Distance Ambulated (ft) 4 ft  Range of Motion/Exercises Active;All extremities  Activity Response Tolerated well  Mobility Referral Yes  $Mobility charge 1 Mobility  Mobility Specialist Start Time (ACUTE ONLY) 1030  Mobility Specialist Stop Time (ACUTE ONLY) 1040  Mobility Specialist Time Calculation (min) (ACUTE ONLY) 10 min   Pt was received in bed, agreeable to mobility. Required MaxA +2 to stand, and ModA to transfer to chair with RW. Tolerated well, c/o weakness while standing. Call bell in reach, chair alarm on. All needs met.   Lawerance Bach Mobility Specialist Please contact via Special educational needs teacher or  Rehab office at [phone]

## 2023-03-27 NOTE — Discharge Summary (Signed)
Physician Discharge Summary   Patient: Garrett Ferguson MRN: [id number] DOB: 06/10/1946  Admit date:     03/21/2023  Discharge date: 03/27/23  Discharge Physician: Kendell Bane   PCP: [facility]   Recommendations at discharge:   Follow-up with PCP in 1 week  Discharge Diagnoses: Principal Problem:   UTI (urinary tract infection) Active Problems:   Restless legs syndrome (RLS)   Hypertension   Dysrhythmia   Acute encephalopathy   Sepsis due to undetermined organism [facility])  Resolved Problems:   * No resolved hospital problems. *  Hospital Course: 77 year old male with a history of bladder cancer, hypertension, restless leg syndrome, lymphedema, peripheral arterial disease presenting with fever and altered mental status with associated dysuria.  History is supplemented by the patient's wife.  Apparently the patient had been complaining of some urinary frequency and dysuria for about 2 days prior to admission.  On the morning of 03/21/2023, the patient's wife left for work and noted that the patient was sitting in his recliner.  He had seemed sleepy at that time.  When she returned from work later in the evening around 4 to 5 PM, the patient was still sitting in his recliner and was lethargic and slow to answer and confused.  He felt hot to the touch.  EMS was activated.  The patient had temperature of 103.4 F per EMS.  He was given a gram of ceftriaxone by EMS and brought to the emergency department.  Apparently the patient had a few episodes of nausea and vomiting on 03/21/2023.  He had denied any headache, chest pain, shortness breath, cough, hemoptysis, abdominal pain. In the ED, the patient was febrile up to 100.4 F.  He was hemodynamically stable with oxygen saturation 97% on room air.  WBC 17.3, hemoglobin 14.3, platelets 217,000.  Sodium 132, potassium 3.8, bicarbonate 22, serum creatinine 0.67.  Lactic acid peaked at 1.8.  UA showed >50 WBC.  Urine and blood cultures were  obtained.  The patient was started on ceftriaxone.   Severe sepsis -Present on admission-resolved -POA: fever, leukocytosis, and altered mental status -Secondary to UTI -Lactic acid peaked  1.8 -Urine culture grew E. 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 Multiple. Most narrow of which was proximal most bulbar urethra just distal to membranous urethra>>>no worrisome tumor or lesions   Lymphedema -Patient uses lymphedema pumps at home -follow up [facility] vascular surgery, lymphedema clinic   Restless leg syndrome -Continue pramipexole   Deconditioning -PT>>SNF -pt initially refused, but reconsidered 03/23/23         Family Communication:   spouse at bedside 8/12     Code Status:  FULL         Procedures: As Listed in Progress Note Above   Antibiotics: Ceftriaxone 8/7>>8/10 Cephalexin 8/11>>          Disposition: Skilled nursing facility Diet recommendation:  Discharge Diet Orders (From admission, onward)     Start     Ordered   03/27/23 0000  Diet -  low sodium heart healthy        03/27/23 1205           Cardiac diet DISCHARGE MEDICATION: Allergies as of 03/27/2023       Reactions   Sulfamethoxazole-trimethoprim    Diclofenac Sodium Rash   Latex Rash   Levofloxacin Rash   Pt developed red spots on leg and itching 2-3 hours after taking  po Levaquin   Penicillins Rash        Medication List     STOP taking these medications    cephALEXin 500 MG capsule Commonly known as: KEFLEX   fluticasone 50 MCG/ACT nasal spray Commonly known as: FLONASE       TAKE these medications    aspirin EC 81 MG tablet Take 81 mg by mouth daily.   atorvastatin 10 MG tablet Commonly known as: LIPITOR Take 10 mg by mouth daily.   benzonatate 100 MG capsule Commonly known as: TESSALON Take 1 capsule (100 mg total) by mouth every 8 (eight) hours.   buPROPion 150 MG 24 hr tablet Commonly known as: WELLBUTRIN XL Take 150 mg by mouth daily.   cefdinir 300 MG capsule Commonly known as: OMNICEF Take 1 capsule (300 mg total) by mouth every 12 (twelve) hours.   cholecalciferol 25 MCG (1000 UNIT) tablet Commonly known as: VITAMIN D3 Take 1,000 Units by mouth daily.   furosemide 20 MG tablet Commonly known as: LASIX Take 20 mg by mouth every other day.   gabapentin 300 MG capsule Commonly known as: NEURONTIN TAKE 1 CAPSULE BY MOUTH IN THE MORNING, ONE CAPSULE AT NOON AND TWO CAPSULES AT NIGHT What changed: See the new instructions.   lisinopril 20 MG tablet Commonly known as: ZESTRIL Take 20 mg by mouth daily.   metoprolol succinate 100 MG 24 hr tablet Commonly known as: TOPROL-XL Take 100 mg by mouth daily.   multivitamin with minerals Tabs tablet Take 1 tablet by mouth daily.   naphazoline-glycerin 0.012-0.25 % Soln Commonly known as: CLEAR EYES REDNESS Place 2 drops into the left eye 4 (four) times daily as needed for eye irritation.   pramipexole 1 MG tablet Commonly known as: MIRAPEX TAKE 1 TABLET BY MOUTH TWICE DURING THE DAY AND ONE AND ONE-HALF TABLETS AT NIGHT   prednisoLONE Acetate P-F 1 % ophthalmic suspension Generic drug: prednisoLONE acetate Place 1 drop into the right eye 3 (three) times daily.   sertraline 100 MG tablet Commonly known as: ZOLOFT Take 200 mg by mouth daily.   tamsulosin 0.4  MG Caps capsule Commonly known as: FLOMAX Take 0.4 mg by mouth daily.   zinc gluconate 50 MG tablet Take 50 mg by mouth daily.        Contact information for after-discharge care     Destination     [facility] .   Service: Skilled Nursing Contact information: [address] [place] [address] [phone]                    Discharge Exam: Ceasar Mons Weights   03/21/23 1922 03/22/23 0012  Weight: 113 kg 105 kg        General:  AAO x 3,  cooperative, no distress;   HEENT:  Normocephalic, PERRL, otherwise with in Normal limits   Neuro:  CNII-XII intact. , normal motor and sensation, reflexes intact   Lungs:   Clear to auscultation BL, Respirations unlabored,  No wheezes / crackles  Cardio:  S1/S2, RRR, No murmure, No Rubs or Gallops   Abdomen:  Soft, non-tender, bowel sounds active all four quadrants, no guarding or peritoneal signs.  Muscular  skeletal:  Limited exam -global generalized weaknesses - in bed, able to move all 4 extremities,   2+ pulses,  symmetric, No pitting edema  Skin:  Dry, warm to touch, negative for any Rashes,  Wounds: Please see nursing documentation          Condition at discharge: fair  The results of significant diagnostics from this hospitalization (including imaging, microbiology, ancillary and laboratory) are listed below for reference.   Imaging Studies: DG Wrist 2 Views Left  Result Date: 03/23/2023 CLINICAL DATA:  Left wrist pain, no injury EXAM: LEFT WRIST - 2 VIEW COMPARISON:  None Available. FINDINGS: No fracture or dislocation is seen. Mild degenerative changes of the radiocarpal joint and 1st carpometacarpal joint. The visualized soft tissues are unremarkable. IMPRESSION: No acute osseous abnormality is seen. Mild degenerative changes. Electronically Signed   By: Charline Bills M.D.   On: 03/23/2023 18:09   DG Chest Port 1 View  Result Date:  03/21/2023 CLINICAL DATA:  Sepsis EXAM: PORTABLE CHEST 1 VIEW COMPARISON:  04/05/2021 FINDINGS: Cardiomegaly. No confluent opacities, effusions or edema. No acute bony abnormality. IMPRESSION: Cardiomegaly.  No active disease. Electronically Signed   By: Charlett Nose M.D.   On: 03/21/2023 19:56    Microbiology: Results for orders placed or performed during the hospital encounter of 03/21/23  Urine Culture     Status: Abnormal   Collection Time: 03/21/23  7:36 PM   Specimen: Urine, Random  Result Value Ref Range Status   Specimen Description   Final    URINE, RANDOM Performed at [facility], [address] [place], [place] [address]    Special Requests   Final    NONE Reflexed from [id number] Performed at [facility] [address] [place], [place] [address]    Culture >=100,000 COLONIES/mL ESCHERICHIA COLI (A)  Final   Report Status 03/24/2023 FINAL  Final   Organism ID, Bacteria ESCHERICHIA COLI (A)  Final      Susceptibility   Escherichia coli - MIC*    AMPICILLIN <=2 SENSITIVE Sensitive     CEFAZOLIN <=4 SENSITIVE Sensitive     CEFEPIME <=0.12 SENSITIVE Sensitive     CEFTRIAXONE <=0.25 SENSITIVE Sensitive     CIPROFLOXACIN <=0.25 SENSITIVE Sensitive     GENTAMICIN <=1 SENSITIVE Sensitive     IMIPENEM <=0.25 SENSITIVE Sensitive     NITROFURANTOIN <=16 SENSITIVE Sensitive     TRIMETH/SULFA <=20 SENSITIVE Sensitive     AMPICILLIN/SULBACTAM <=2 SENSITIVE Sensitive     PIP/TAZO <=4 SENSITIVE Sensitive     * >=100,000 COLONIES/mL ESCHERICHIA COLI  Culture, blood (Routine x 2)     Status: None   Collection Time: 03/21/23  7:45 PM   Specimen: BLOOD  Result Value Ref Range Status   Specimen Description BLOOD BLOOD RIGHT ARM  Final   Special Requests   Final    BOTTLES DRAWN AEROBIC AND ANAEROBIC Blood Culture adequate volume   Culture   Final    NO GROWTH 5 DAYS Performed at [facility], [address] [place], [place] [address]    Report Status 03/26/2023 FINAL  Final  Culture,  blood (Routine x 2)     Status: None   Collection Time: 03/21/23  8:06 PM   Specimen: BLOOD  Result Value Ref Range Status   Specimen Description BLOOD BLOOD LEFT HAND  Final   Special Requests   Final    BOTTLES DRAWN AEROBIC ONLY Blood Culture adequate volume   Culture   Final    NO GROWTH 5 DAYS Performed at [facility], [address], [place], [place] [address]    Report Status 03/26/2023 FINAL  Final    Labs: CBC: Recent Labs  Lab 03/21/23 1945 03/22/23 0420 03/23/23 0421 03/24/23 0453  WBC 17.3* 18.9* 14.0* 8.5  NEUTROABS 15.6*  --   --   --   HGB 14.3 12.3* 11.9* 11.7*  HCT 44.7 38.4* 37.9* 37.1*  MCV 94.3 94.3 96.2 95.9  PLT 217 180 156 164   Basic Metabolic Panel: Recent Labs  Lab 03/21/23 1945 03/22/23 0420 03/23/23 0421 03/24/23 0453  NA 135 132* 131* 131*  K 3.9 3.8 3.9 4.0  CL 98 101 101 98  CO2 25 22 22 22   GLUCOSE 116* 116* 107* 98  BUN 21 17 19 20   CREATININE 0.76 0.67 0.64 0.66  CALCIUM 9.3 8.3* 8.2* 8.1*  MG  --   --  1.8  --    Liver Function Tests: Recent Labs  Lab 03/21/23 1945  AST 16  ALT 14  ALKPHOS 83  BILITOT 1.6*  PROT 8.6*  ALBUMIN 4.3   CBG: No results for input(s): "GLUCAP" in the last 168 hours.  Discharge time spent: greater than 30 minutes.  Signed: Kendell Bane, MD [facility] 03/27/2023

## 2023-03-27 NOTE — TOC Transition Note (Signed)
Transition of Care [facility]) - CM/SW Discharge Note   Patient Details  Name: Garrett Ferguson MRN: [id number] Date of Birth: 07/06/1946  Transition of Care [facility]) CM/SW Contact:  Elliot Gault, LCSW Phone Number: 03/27/2023, 1:13 PM   Clinical Narrative:     Pt medically stable for dc per MD. Pt has insurance auth for SNF. Debbie at [facility] states they can take pt today.   Updated pt/wife and they remain in agreement with the dc plan.   DC clinical sent electronically. RN to call report. Transport arrange with [facility].  No other TOC needs for dc.  Final next level of care: Skilled Nursing Facility Barriers to Discharge: Barriers Resolved   Patient Goals and CMS Choice CMS Medicare.gov Compare Post Acute Care list provided to:: Patient Choice offered to / list presented to : Spouse  Discharge Placement                         Discharge Plan and Services Additional resources added to the After Visit Summary for       Post Acute Care Choice: Skilled Nursing Facility                    HH Arranged: PT, OT, Nurse's Aide [facility] Agency: [facility] Date [facility] Agency Contacted: 03/23/23   Representative spoke with at [facility] Agency: Clifton Custard  Social Determinants of Health (SDOH) Interventions SDOH Screenings   Food Insecurity: No Food Insecurity (03/22/2023)  Housing: Low Risk  (03/22/2023)  Transportation Needs: No Transportation Needs (03/22/2023)  Utilities: Not At Risk (03/22/2023)  Financial Resource Strain: Medium Risk (04/06/2021)   Received from [facility]  Physical Activity: Insufficiently Active (02/23/2021)   Received from [facility]  Social Connections: Socially Integrated (02/23/2021)   Received from [facility]  Stress: No Stress Concern Present (02/23/2021)   [facility] from [facility]  Tobacco Use: Low Risk  (03/21/2023)  Health Literacy: Low Risk  (02/23/2021)   Received  from [facility], [facility]     Readmission Risk Interventions     No data to display

## 2023-03-27 NOTE — Care Management Important Message (Signed)
Important Message  Patient Details  Name: Garrett Ferguson MRN: [id number] Date of Birth: Apr 01, 1946   Medicare Important Message Given:  Yes     Corey Harold 03/27/2023, 12:36 PM

## 2023-10-18 ENCOUNTER — Inpatient Hospital Stay (HOSPITAL_COMMUNITY)

## 2023-10-18 ENCOUNTER — Emergency Department (HOSPITAL_COMMUNITY)

## 2023-10-18 ENCOUNTER — Encounter (HOSPITAL_COMMUNITY): Payer: Self-pay

## 2023-10-18 ENCOUNTER — Other Ambulatory Visit: Payer: Self-pay

## 2023-10-18 ENCOUNTER — Inpatient Hospital Stay (HOSPITAL_COMMUNITY)
Admission: EM | Admit: 2023-10-18 | Discharge: 2023-11-03 | DRG: 870 | Disposition: A | Discharge status: Skilled Nursing Facility | Attending: Family Medicine | Admitting: Family Medicine

## 2023-10-18 DIAGNOSIS — G2581 Restless legs syndrome: Secondary | ICD-10-CM | POA: Diagnosis present

## 2023-10-18 DIAGNOSIS — J189 Pneumonia, unspecified organism: Secondary | ICD-10-CM | POA: Diagnosis present

## 2023-10-18 DIAGNOSIS — M7989 Other specified soft tissue disorders: Secondary | ICD-10-CM | POA: Diagnosis not present

## 2023-10-18 DIAGNOSIS — N390 Urinary tract infection, site not specified: Secondary | ICD-10-CM | POA: Diagnosis present

## 2023-10-18 DIAGNOSIS — D649 Anemia, unspecified: Secondary | ICD-10-CM | POA: Diagnosis present

## 2023-10-18 DIAGNOSIS — G9341 Metabolic encephalopathy: Secondary | ICD-10-CM | POA: Diagnosis not present

## 2023-10-18 DIAGNOSIS — I480 Paroxysmal atrial fibrillation: Secondary | ICD-10-CM | POA: Diagnosis present

## 2023-10-18 DIAGNOSIS — F32A Depression, unspecified: Secondary | ICD-10-CM | POA: Diagnosis present

## 2023-10-18 DIAGNOSIS — R6520 Severe sepsis without septic shock: Secondary | ICD-10-CM | POA: Diagnosis not present

## 2023-10-18 DIAGNOSIS — I82612 Acute embolism and thrombosis of superficial veins of left upper extremity: Secondary | ICD-10-CM | POA: Diagnosis present

## 2023-10-18 DIAGNOSIS — J9601 Acute respiratory failure with hypoxia: Secondary | ICD-10-CM | POA: Diagnosis present

## 2023-10-18 DIAGNOSIS — L03116 Cellulitis of left lower limb: Secondary | ICD-10-CM | POA: Diagnosis present

## 2023-10-18 DIAGNOSIS — T68XXXA Hypothermia, initial encounter: Principal | ICD-10-CM

## 2023-10-18 DIAGNOSIS — Z79891 Long term (current) use of opiate analgesic: Secondary | ICD-10-CM

## 2023-10-18 DIAGNOSIS — Z8249 Family history of ischemic heart disease and other diseases of the circulatory system: Secondary | ICD-10-CM

## 2023-10-18 DIAGNOSIS — I10 Essential (primary) hypertension: Secondary | ICD-10-CM | POA: Diagnosis present

## 2023-10-18 DIAGNOSIS — Z66 Do not resuscitate: Secondary | ICD-10-CM | POA: Diagnosis present

## 2023-10-18 DIAGNOSIS — Z515 Encounter for palliative care: Secondary | ICD-10-CM | POA: Diagnosis not present

## 2023-10-18 DIAGNOSIS — E871 Hypo-osmolality and hyponatremia: Secondary | ICD-10-CM | POA: Diagnosis present

## 2023-10-18 DIAGNOSIS — G928 Other toxic encephalopathy: Secondary | ICD-10-CM | POA: Diagnosis present

## 2023-10-18 DIAGNOSIS — W1830XA Fall on same level, unspecified, initial encounter: Secondary | ICD-10-CM | POA: Diagnosis present

## 2023-10-18 DIAGNOSIS — R310 Gross hematuria: Secondary | ICD-10-CM | POA: Diagnosis not present

## 2023-10-18 DIAGNOSIS — Z1152 Encounter for screening for COVID-19: Secondary | ICD-10-CM | POA: Diagnosis not present

## 2023-10-18 DIAGNOSIS — Z6832 Body mass index (BMI) 32.0-32.9, adult: Secondary | ICD-10-CM

## 2023-10-18 DIAGNOSIS — R531 Weakness: Secondary | ICD-10-CM | POA: Diagnosis not present

## 2023-10-18 DIAGNOSIS — R4589 Other symptoms and signs involving emotional state: Secondary | ICD-10-CM | POA: Diagnosis not present

## 2023-10-18 DIAGNOSIS — R5381 Other malaise: Secondary | ICD-10-CM | POA: Diagnosis not present

## 2023-10-18 DIAGNOSIS — Z79899 Other long term (current) drug therapy: Secondary | ICD-10-CM

## 2023-10-18 DIAGNOSIS — R739 Hyperglycemia, unspecified: Secondary | ICD-10-CM | POA: Diagnosis not present

## 2023-10-18 DIAGNOSIS — Y92008 Other place in unspecified non-institutional (private) residence as the place of occurrence of the external cause: Secondary | ICD-10-CM | POA: Diagnosis not present

## 2023-10-18 DIAGNOSIS — Z880 Allergy status to penicillin: Secondary | ICD-10-CM

## 2023-10-18 DIAGNOSIS — I878 Other specified disorders of veins: Secondary | ICD-10-CM | POA: Diagnosis present

## 2023-10-18 DIAGNOSIS — K219 Gastro-esophageal reflux disease without esophagitis: Secondary | ICD-10-CM | POA: Diagnosis present

## 2023-10-18 DIAGNOSIS — I4891 Unspecified atrial fibrillation: Secondary | ICD-10-CM | POA: Diagnosis not present

## 2023-10-18 DIAGNOSIS — N3000 Acute cystitis without hematuria: Secondary | ICD-10-CM | POA: Diagnosis not present

## 2023-10-18 DIAGNOSIS — R680 Hypothermia, not associated with low environmental temperature: Secondary | ICD-10-CM | POA: Diagnosis not present

## 2023-10-18 DIAGNOSIS — Z993 Dependence on wheelchair: Secondary | ICD-10-CM

## 2023-10-18 DIAGNOSIS — R1310 Dysphagia, unspecified: Secondary | ICD-10-CM | POA: Diagnosis not present

## 2023-10-18 DIAGNOSIS — Z8551 Personal history of malignant neoplasm of bladder: Secondary | ICD-10-CM | POA: Diagnosis not present

## 2023-10-18 DIAGNOSIS — I739 Peripheral vascular disease, unspecified: Secondary | ICD-10-CM | POA: Diagnosis present

## 2023-10-18 DIAGNOSIS — Z881 Allergy status to other antibiotic agents status: Secondary | ICD-10-CM

## 2023-10-18 DIAGNOSIS — T462X5A Adverse effect of other antidysrhythmic drugs, initial encounter: Secondary | ICD-10-CM | POA: Diagnosis not present

## 2023-10-18 DIAGNOSIS — E872 Acidosis, unspecified: Secondary | ICD-10-CM | POA: Diagnosis present

## 2023-10-18 DIAGNOSIS — A419 Sepsis, unspecified organism: Secondary | ICD-10-CM | POA: Diagnosis present

## 2023-10-18 DIAGNOSIS — H9190 Unspecified hearing loss, unspecified ear: Secondary | ICD-10-CM | POA: Diagnosis present

## 2023-10-18 DIAGNOSIS — R569 Unspecified convulsions: Secondary | ICD-10-CM | POA: Diagnosis not present

## 2023-10-18 DIAGNOSIS — D696 Thrombocytopenia, unspecified: Secondary | ICD-10-CM | POA: Diagnosis present

## 2023-10-18 DIAGNOSIS — R6521 Severe sepsis with septic shock: Secondary | ICD-10-CM | POA: Diagnosis present

## 2023-10-18 DIAGNOSIS — I890 Lymphedema, not elsewhere classified: Secondary | ICD-10-CM | POA: Diagnosis not present

## 2023-10-18 DIAGNOSIS — E8809 Other disorders of plasma-protein metabolism, not elsewhere classified: Secondary | ICD-10-CM | POA: Diagnosis present

## 2023-10-18 DIAGNOSIS — Z85828 Personal history of other malignant neoplasm of skin: Secondary | ICD-10-CM

## 2023-10-18 DIAGNOSIS — R4182 Altered mental status, unspecified: Secondary | ICD-10-CM | POA: Diagnosis not present

## 2023-10-18 DIAGNOSIS — T8383XA Hemorrhage of genitourinary prosthetic devices, implants and grafts, initial encounter: Secondary | ICD-10-CM | POA: Diagnosis not present

## 2023-10-18 DIAGNOSIS — Z7189 Other specified counseling: Secondary | ICD-10-CM

## 2023-10-18 DIAGNOSIS — Z87440 Personal history of urinary (tract) infections: Secondary | ICD-10-CM

## 2023-10-18 DIAGNOSIS — F419 Anxiety disorder, unspecified: Secondary | ICD-10-CM | POA: Diagnosis present

## 2023-10-18 DIAGNOSIS — G894 Chronic pain syndrome: Secondary | ICD-10-CM | POA: Diagnosis present

## 2023-10-18 DIAGNOSIS — Y738 Miscellaneous gastroenterology and urology devices associated with adverse incidents, not elsewhere classified: Secondary | ICD-10-CM | POA: Diagnosis not present

## 2023-10-18 DIAGNOSIS — Z91040 Latex allergy status: Secondary | ICD-10-CM

## 2023-10-18 DIAGNOSIS — Z882 Allergy status to sulfonamides status: Secondary | ICD-10-CM

## 2023-10-18 DIAGNOSIS — Z808 Family history of malignant neoplasm of other organs or systems: Secondary | ICD-10-CM

## 2023-10-18 DIAGNOSIS — E876 Hypokalemia: Secondary | ICD-10-CM | POA: Diagnosis present

## 2023-10-18 DIAGNOSIS — G40909 Epilepsy, unspecified, not intractable, without status epilepticus: Secondary | ICD-10-CM | POA: Diagnosis present

## 2023-10-18 DIAGNOSIS — Z888 Allergy status to other drugs, medicaments and biological substances status: Secondary | ICD-10-CM

## 2023-10-18 DIAGNOSIS — I959 Hypotension, unspecified: Secondary | ICD-10-CM | POA: Insufficient documentation

## 2023-10-18 DIAGNOSIS — R627 Adult failure to thrive: Secondary | ICD-10-CM | POA: Diagnosis present

## 2023-10-18 DIAGNOSIS — R296 Repeated falls: Secondary | ICD-10-CM | POA: Diagnosis present

## 2023-10-18 DIAGNOSIS — Z7982 Long term (current) use of aspirin: Secondary | ICD-10-CM

## 2023-10-18 DIAGNOSIS — G9340 Encephalopathy, unspecified: Secondary | ICD-10-CM | POA: Diagnosis not present

## 2023-10-18 DIAGNOSIS — R578 Other shock: Secondary | ICD-10-CM | POA: Diagnosis not present

## 2023-10-18 DIAGNOSIS — E785 Hyperlipidemia, unspecified: Secondary | ICD-10-CM | POA: Diagnosis present

## 2023-10-18 LAB — COMPREHENSIVE METABOLIC PANEL
ALT: 45 U/L — ABNORMAL HIGH (ref 0–44)
AST: 57 U/L — ABNORMAL HIGH (ref 15–41)
Albumin: 3.3 g/dL — ABNORMAL LOW (ref 3.5–5.0)
Alkaline Phosphatase: 97 U/L (ref 38–126)
Anion gap: 5 (ref 5–15)
BUN: 25 mg/dL — ABNORMAL HIGH (ref 8–23)
CO2: 25 mmol/L (ref 22–32)
Calcium: 8.5 mg/dL — ABNORMAL LOW (ref 8.9–10.3)
Chloride: 109 mmol/L (ref 98–111)
Creatinine, Ser: 0.59 mg/dL — ABNORMAL LOW (ref 0.61–1.24)
GFR, Estimated: >60 mL/min (ref >60)
Glucose, Bld: 112 mg/dL — ABNORMAL HIGH (ref 70–99)
Potassium: 3.2 mmol/L — ABNORMAL LOW (ref 3.5–5.1)
Sodium: 139 mmol/L (ref 135–145)
Total Bilirubin: 0.6 mg/dL (ref 0.0–1.2)
Total Protein: 6.8 g/dL (ref 6.5–8.1)

## 2023-10-18 LAB — CBC WITH DIFFERENTIAL/PLATELET
Abs Immature Granulocytes: 0.11 10*3/uL — ABNORMAL HIGH (ref 0.00–0.07)
Basophils Absolute: 0 10*3/uL (ref 0.0–0.1)
Basophils Relative: 1 %
Eosinophils Absolute: 0.1 10*3/uL (ref 0.0–0.5)
Eosinophils Relative: 2 %
HCT: 37 % — ABNORMAL LOW (ref 39.0–52.0)
Hemoglobin: 11.6 g/dL — ABNORMAL LOW (ref 13.0–17.0)
Immature Granulocytes: 3 %
Lymphocytes Relative: 16 %
Lymphs Abs: 0.7 10*3/uL (ref 0.7–4.0)
MCH: 28.8 pg (ref 26.0–34.0)
MCHC: 31.4 g/dL (ref 30.0–36.0)
MCV: 91.8 fL (ref 80.0–100.0)
Monocytes Absolute: 0.2 10*3/uL (ref 0.1–1.0)
Monocytes Relative: 4 %
Neutro Abs: 3 10*3/uL (ref 1.7–7.7)
Neutrophils Relative %: 74 %
Platelets: 149 10*3/uL — ABNORMAL LOW (ref 150–400)
RBC: 4.03 MIL/uL — ABNORMAL LOW (ref 4.22–5.81)
RDW: 14.8 % (ref 11.5–15.5)
WBC: 4 10*3/uL (ref 4.0–10.5)
nRBC: 0 % (ref 0.0–0.2)

## 2023-10-18 LAB — BLOOD GAS, ARTERIAL
Acid-base deficit: 0.7 mmol/L (ref 0.0–2.0)
Bicarbonate: 24.2 mmol/L (ref 20.0–28.0)
Drawn by: 422461
FIO2: 100 %
MECHVT: 600 mL
O2 Saturation: 100 %
PEEP: 5 cmH2O
Patient temperature: 35.4
RATE: 15 {breaths}/min
pCO2 arterial: 37 mmHg (ref 32–48)
pH, Arterial: 7.41 (ref 7.35–7.45)
pO2, Arterial: 137 mmHg — ABNORMAL HIGH (ref 83–108)

## 2023-10-18 LAB — URINALYSIS, W/ REFLEX TO CULTURE (INFECTION SUSPECTED)
Bilirubin Urine: NEGATIVE
Glucose, UA: NEGATIVE mg/dL
Ketones, ur: NEGATIVE mg/dL
Nitrite: NEGATIVE
Protein, ur: 100 mg/dL — AB
RBC / HPF: >50 RBC/hpf (ref 0–5)
Specific Gravity, Urine: 1.023 (ref 1.005–1.030)
WBC, UA: >50 WBC/hpf (ref 0–5)
pH: 5 (ref 5.0–8.0)

## 2023-10-18 LAB — PROCALCITONIN: Procalcitonin: <0.1 ng/mL

## 2023-10-18 LAB — PROTIME-INR
INR: 1.1 (ref 0.8–1.2)
Prothrombin Time: 14.4 s (ref 11.4–15.2)

## 2023-10-18 LAB — MAGNESIUM: Magnesium: 2 mg/dL (ref 1.7–2.4)

## 2023-10-18 LAB — GLUCOSE, CAPILLARY
Glucose-Capillary: 88 mg/dL (ref 70–99)
Glucose-Capillary: 96 mg/dL (ref 70–99)

## 2023-10-18 LAB — PHOSPHORUS: Phosphorus: 3.4 mg/dL (ref 2.5–4.6)

## 2023-10-18 LAB — CBG MONITORING, ED: Glucose-Capillary: 99 mg/dL (ref 70–99)

## 2023-10-18 LAB — APTT: aPTT: 39 s — ABNORMAL HIGH (ref 24–36)

## 2023-10-18 LAB — LACTIC ACID, PLASMA
Lactic Acid, Venous: 2 mmol/L (ref 0.5–1.9)
Lactic Acid, Venous: 0.9 mmol/L (ref 0.5–1.9)
Lactic Acid, Venous: 1.1 mmol/L (ref 0.5–1.9)

## 2023-10-18 LAB — RESP PANEL BY RT-PCR (RSV, FLU A&B, COVID)  RVPGX2
Influenza A by PCR: NEGATIVE
Influenza B by PCR: NEGATIVE
Resp Syncytial Virus by PCR: NEGATIVE
SARS Coronavirus 2 by RT PCR: NEGATIVE

## 2023-10-18 MED ORDER — FAMOTIDINE 20 MG PO TABS: 20.0000 mg | ORAL_TABLET | Freq: Two times a day (BID) | ORAL | Status: DC

## 2023-10-18 MED ORDER — SERTRALINE HCL 50 MG PO TABS: 200.0000 mg | ORAL_TABLET | Freq: Every day | ORAL | Status: DC

## 2023-10-18 MED ORDER — FLEET ENEMA RE ENEM: 1.0000 | ENEMA | Freq: Once | RECTAL | Status: DC | PRN

## 2023-10-18 MED ORDER — HYDRALAZINE HCL 20 MG/ML IJ SOLN: 10.0000 mg | INTRAMUSCULAR | Status: DC | PRN

## 2023-10-18 MED ORDER — SODIUM CHLORIDE 0.9% FLUSH
3.0000 mL | Freq: Two times a day (BID) | INTRAVENOUS | Status: DC
  Administered 2023-10-18 (×2): 3 mL via INTRAVENOUS

## 2023-10-18 MED ORDER — ORAL CARE MOUTH RINSE: 15.0000 mL | OROMUCOSAL | Status: DC | PRN

## 2023-10-18 MED ORDER — ACETAMINOPHEN 325 MG PO TABS: 650.0000 mg | ORAL_TABLET | Freq: Four times a day (QID) | ORAL | Status: DC | PRN

## 2023-10-18 MED ORDER — SODIUM CHLORIDE 0.9% FLUSH
3.0000 mL | Freq: Two times a day (BID) | INTRAVENOUS | Status: DC
  Administered 2023-10-18 – 2023-10-19 (×2): 3 mL via INTRAVENOUS

## 2023-10-18 MED ORDER — ADULT MULTIVITAMIN W/MINERALS CH
1.0000 | ORAL_TABLET | Freq: Every day | ORAL | Status: DC
  Filled 2023-10-18: qty 1

## 2023-10-18 MED ORDER — ASPIRIN 81 MG PO TBEC
81.0000 mg | DELAYED_RELEASE_TABLET | Freq: Every day | ORAL | Status: DC
  Filled 2023-10-18: qty 1

## 2023-10-18 MED ORDER — OXYCODONE HCL 5 MG PO TABS: 5.0000 mg | ORAL_TABLET | ORAL | Status: DC | PRN

## 2023-10-18 MED ORDER — PROPOFOL 1000 MG/100ML IV EMUL
5.0000 ug/kg/min | INTRAVENOUS | Status: DC
  Administered 2023-10-18: 5 ug/kg/min via INTRAVENOUS
  Filled 2023-10-18: qty 100

## 2023-10-18 MED ORDER — SODIUM CHLORIDE 0.9% FLUSH: 10.0000 mL | INTRAVENOUS | Status: DC | PRN

## 2023-10-18 MED ORDER — HYDROMORPHONE HCL 1 MG/ML IJ SOLN: 0.5000 mg | INTRAMUSCULAR | Status: DC | PRN

## 2023-10-18 MED ORDER — SODIUM CHLORIDE 0.9 % IV BOLUS
1000.0000 mL | Freq: Once | INTRAVENOUS | Status: AC
  Administered 2023-10-18: 1000 mL via INTRAVENOUS

## 2023-10-18 MED ORDER — POTASSIUM CHLORIDE 20 MEQ/100ML IV SOLN: 20.0000 meq | Freq: Once | INTRAVENOUS | Status: DC

## 2023-10-18 MED ORDER — POTASSIUM CHLORIDE CRYS ER 20 MEQ PO TBCR: 40.0000 meq | EXTENDED_RELEASE_TABLET | Freq: Once | ORAL | Status: DC

## 2023-10-18 MED ORDER — ONDANSETRON HCL 4 MG/2ML IJ SOLN: 4.0000 mg | Freq: Four times a day (QID) | INTRAMUSCULAR | Status: DC | PRN

## 2023-10-18 MED ORDER — BISACODYL 5 MG PO TBEC: 5.0000 mg | DELAYED_RELEASE_TABLET | Freq: Every day | ORAL | Status: DC | PRN

## 2023-10-18 MED ORDER — LACTATED RINGERS IV SOLN
150.0000 mL/h | INTRAVENOUS | Status: DC
  Administered 2023-10-18 – 2023-10-19 (×3): 150 mL/h via INTRAVENOUS

## 2023-10-18 MED ORDER — MIDODRINE HCL 5 MG PO TABS: 10.0000 mg | ORAL_TABLET | Freq: Three times a day (TID) | ORAL | Status: DC

## 2023-10-18 MED ORDER — SODIUM CHLORIDE 0.9 % IV SOLN
250.0000 mL | INTRAVENOUS | Status: AC
  Administered 2023-10-18: 250 mL via INTRAVENOUS

## 2023-10-18 MED ORDER — ACETAMINOPHEN 650 MG RE SUPP: 650.0000 mg | Freq: Four times a day (QID) | RECTAL | Status: DC | PRN

## 2023-10-18 MED ORDER — KETAMINE HCL 50 MG/5ML IJ SOSY
PREFILLED_SYRINGE | INTRAMUSCULAR | Status: AC
  Filled 2023-10-18: qty 5

## 2023-10-18 MED ORDER — VANCOMYCIN HCL 1750 MG/350ML IV SOLN
1750.0000 mg | INTRAVENOUS | Status: DC
  Administered 2023-10-19: 1750 mg via INTRAVENOUS
  Filled 2023-10-18: qty 350

## 2023-10-18 MED ORDER — LORAZEPAM 2 MG/ML IJ SOLN
0.5000 mg | Freq: Once | INTRAMUSCULAR | Status: AC
  Administered 2023-10-18: 0.5 mg via INTRAVENOUS
  Filled 2023-10-18: qty 1

## 2023-10-18 MED ORDER — IPRATROPIUM BROMIDE 0.02 % IN SOLN: 0.5000 mg | Freq: Four times a day (QID) | RESPIRATORY_TRACT | Status: DC | PRN

## 2023-10-18 MED ORDER — SODIUM CHLORIDE 0.9 % IV BOLUS
2000.0000 mL | Freq: Once | INTRAVENOUS | Status: AC
  Administered 2023-10-18: 2000 mL via INTRAVENOUS

## 2023-10-18 MED ORDER — SODIUM CHLORIDE 0.9 % IV SOLN: 250.0000 mL | INTRAVENOUS | Status: DC | PRN

## 2023-10-18 MED ORDER — CHLORHEXIDINE GLUCONATE CLOTH 2 % EX PADS
6.0000 | MEDICATED_PAD | Freq: Every day | CUTANEOUS | Status: DC
  Administered 2023-10-18 – 2023-10-24 (×7): 6 via TOPICAL

## 2023-10-18 MED ORDER — ETOMIDATE 2 MG/ML IV SOLN
INTRAVENOUS | Status: AC
  Administered 2023-10-18: 25 mg via INTRAVENOUS
  Filled 2023-10-18: qty 20

## 2023-10-18 MED ORDER — SODIUM CHLORIDE 0.9% FLUSH
10.0000 mL | Freq: Two times a day (BID) | INTRAVENOUS | Status: DC
  Administered 2023-10-18: 40 mL
  Administered 2023-10-19: 20 mL

## 2023-10-18 MED ORDER — SODIUM CHLORIDE 0.9 % IV SOLN
2.0000 g | Freq: Once | INTRAVENOUS | Status: AC
  Administered 2023-10-18: 2 g via INTRAVENOUS
  Filled 2023-10-18: qty 12.5

## 2023-10-18 MED ORDER — FENTANYL 2500MCG IN NS 250ML (10MCG/ML) PREMIX INFUSION
25.0000 ug/h | INTRAVENOUS | Status: DC
  Administered 2023-10-18: 25 ug/h via INTRAVENOUS
  Filled 2023-10-18: qty 250

## 2023-10-18 MED ORDER — PRAMIPEXOLE DIHYDROCHLORIDE 1 MG PO TABS
1.5000 mg | ORAL_TABLET | Freq: Every day | ORAL | Status: DC
  Filled 2023-10-18: qty 2

## 2023-10-18 MED ORDER — FENTANYL CITRATE PF 50 MCG/ML IJ SOSY
PREFILLED_SYRINGE | INTRAMUSCULAR | Status: AC
  Filled 2023-10-18: qty 2

## 2023-10-18 MED ORDER — INSULIN ASPART 100 UNIT/ML IJ SOLN
0.0000 [IU] | INTRAMUSCULAR | Status: DC
  Administered 2023-10-19 – 2023-10-20 (×2): 2 [IU] via SUBCUTANEOUS

## 2023-10-18 MED ORDER — SODIUM CHLORIDE 0.9 % IV SOLN
2.0000 g | Freq: Once | INTRAVENOUS | Status: AC
  Administered 2023-10-18: 2 g via INTRAVENOUS
  Filled 2023-10-18: qty 20

## 2023-10-18 MED ORDER — SUCCINYLCHOLINE CHLORIDE 200 MG/10ML IV SOSY
PREFILLED_SYRINGE | INTRAVENOUS | Status: AC
  Filled 2023-10-18: qty 10

## 2023-10-18 MED ORDER — ONDANSETRON HCL 4 MG PO TABS: 4.0000 mg | ORAL_TABLET | Freq: Four times a day (QID) | ORAL | Status: DC | PRN

## 2023-10-18 MED ORDER — NOREPINEPHRINE 4 MG/250ML-% IV SOLN
0.0000 ug/min | INTRAVENOUS | Status: DC
  Administered 2023-10-18: 7 ug/min via INTRAVENOUS
  Administered 2023-10-19: 10 ug/min via INTRAVENOUS
  Administered 2023-10-19: 13 ug/min via INTRAVENOUS
  Administered 2023-10-19 (×2): 11 ug/min via INTRAVENOUS
  Administered 2023-10-19: 13 ug/min via INTRAVENOUS
  Administered 2023-10-20: 4 ug/min via INTRAVENOUS
  Administered 2023-10-20 – 2023-10-22 (×2): 10 ug/min via INTRAVENOUS
  Administered 2023-10-23: 9 ug/min via INTRAVENOUS
  Administered 2023-10-23: 5 ug/min via INTRAVENOUS
  Administered 2023-10-24: 6 ug/min via INTRAVENOUS
  Filled 2023-10-18 (×12): qty 250

## 2023-10-18 MED ORDER — POTASSIUM CHLORIDE 10 MEQ/50ML IV SOLN
10.0000 meq | INTRAVENOUS | Status: AC
  Administered 2023-10-18 (×2): 10 meq via INTRAVENOUS
  Filled 2023-10-18 (×2): qty 50

## 2023-10-18 MED ORDER — SODIUM CHLORIDE 0.9 % IV SOLN
INTRAVENOUS | Status: DC
Start: 2023-10-18 — End: 2023-10-18

## 2023-10-18 MED ORDER — GABAPENTIN 100 MG PO CAPS: 100.0000 mg | ORAL_CAPSULE | Freq: Two times a day (BID) | ORAL | Status: DC

## 2023-10-18 MED ORDER — ETOMIDATE 2 MG/ML IV SOLN: 25.0000 mg | Freq: Once | INTRAVENOUS | Status: AC

## 2023-10-18 MED ORDER — LEVALBUTEROL HCL 0.63 MG/3ML IN NEBU: 0.6300 mg | INHALATION_SOLUTION | Freq: Four times a day (QID) | RESPIRATORY_TRACT | Status: DC | PRN

## 2023-10-18 MED ORDER — NOREPINEPHRINE 4 MG/250ML-% IV SOLN
2.0000 ug/min | INTRAVENOUS | Status: DC
  Administered 2023-10-18: 2 ug/min via INTRAVENOUS
  Filled 2023-10-18: qty 250

## 2023-10-18 MED ORDER — LIDOCAINE HCL (PF) 1 % IJ SOLN
INTRAMUSCULAR | Status: AC
  Filled 2023-10-18: qty 5

## 2023-10-18 MED ORDER — SODIUM CHLORIDE 0.9 % IV SOLN: 250.0000 mL | INTRAVENOUS | Status: DC

## 2023-10-18 MED ORDER — ROCURONIUM BROMIDE 10 MG/ML (PF) SYRINGE
PREFILLED_SYRINGE | INTRAVENOUS | Status: AC
Start: 2023-10-18 — End: 2023-10-18
  Administered 2023-10-18: 100 mg via INTRAVENOUS
  Filled 2023-10-18: qty 10

## 2023-10-18 MED ORDER — OXYCODONE HCL ER 10 MG PO T12A: 10.0000 mg | EXTENDED_RELEASE_TABLET | Freq: Two times a day (BID) | ORAL | Status: DC

## 2023-10-18 MED ORDER — MIDAZOLAM HCL 2 MG/2ML IJ SOLN
INTRAMUSCULAR | Status: AC
  Filled 2023-10-18: qty 2

## 2023-10-18 MED ORDER — SODIUM CHLORIDE 0.9% FLUSH: 3.0000 mL | INTRAVENOUS | Status: DC | PRN

## 2023-10-18 MED ORDER — FAMOTIDINE IN NACL 20-0.9 MG/50ML-% IV SOLN
20.0000 mg | Freq: Two times a day (BID) | INTRAVENOUS | Status: DC
  Administered 2023-10-18: 20 mg via INTRAVENOUS
  Filled 2023-10-18 (×2): qty 50

## 2023-10-18 MED ORDER — ALBUMIN HUMAN 5 % IV SOLN
25.0000 g | Freq: Four times a day (QID) | INTRAVENOUS | Status: DC
  Administered 2023-10-18: 25 g via INTRAVENOUS
  Filled 2023-10-18 (×2): qty 500

## 2023-10-18 MED ORDER — VANCOMYCIN HCL 2000 MG/400ML IV SOLN
2000.0000 mg | Freq: Once | INTRAVENOUS | Status: AC
  Administered 2023-10-18: 2000 mg via INTRAVENOUS
  Filled 2023-10-18: qty 400

## 2023-10-18 MED ORDER — ROCURONIUM BROMIDE 10 MG/ML (PF) SYRINGE: 100.0000 mg | PREFILLED_SYRINGE | Freq: Once | INTRAVENOUS | Status: AC

## 2023-10-18 MED ORDER — ORAL CARE MOUTH RINSE
15.0000 mL | OROMUCOSAL | Status: DC
  Administered 2023-10-18 – 2023-10-24 (×69): 15 mL via OROMUCOSAL

## 2023-10-18 MED ORDER — HEPARIN SODIUM (PORCINE) 5000 UNIT/ML IJ SOLN
5000.0000 [IU] | Freq: Three times a day (TID) | INTRAMUSCULAR | Status: DC
  Administered 2023-10-18 – 2023-11-03 (×48): 5000 [IU] via SUBCUTANEOUS
  Filled 2023-10-18 (×48): qty 1

## 2023-10-18 MED ORDER — PRAMIPEXOLE DIHYDROCHLORIDE 0.25 MG PO TABS: 0.1250 mg | ORAL_TABLET | Freq: Three times a day (TID) | ORAL | Status: DC

## 2023-10-18 MED ORDER — HALOPERIDOL LACTATE 5 MG/ML IJ SOLN
2.0000 mg | Freq: Four times a day (QID) | INTRAMUSCULAR | Status: DC | PRN
  Administered 2023-10-18: 2 mg via INTRAVENOUS
  Filled 2023-10-18: qty 1

## 2023-10-18 MED ORDER — SENNOSIDES-DOCUSATE SODIUM 8.6-50 MG PO TABS: 1.0000 | ORAL_TABLET | Freq: Every evening | ORAL | Status: DC | PRN

## 2023-10-18 MED ORDER — PRAMIPEXOLE DIHYDROCHLORIDE 1 MG PO TABS: 2.0000 mg | ORAL_TABLET | Freq: Every day | ORAL | Status: DC

## 2023-10-18 MED ORDER — CEFEPIME HCL 2 G IV SOLR
2.0000 g | Freq: Three times a day (TID) | INTRAVENOUS | Status: DC
  Administered 2023-10-18 – 2023-10-19 (×2): 2 g via INTRAVENOUS
  Filled 2023-10-18 (×2): qty 12.5

## 2023-10-18 MED ORDER — HEPARIN SODIUM (PORCINE) 5000 UNIT/ML IJ SOLN: 5000.0000 [IU] | Freq: Three times a day (TID) | INTRAMUSCULAR | Status: DC

## 2023-10-18 MED ORDER — PHENYLEPHRINE 80 MCG/ML (10ML) SYRINGE FOR IV PUSH (FOR BLOOD PRESSURE SUPPORT)
PREFILLED_SYRINGE | INTRAVENOUS | Status: AC
  Filled 2023-10-18: qty 10

## 2023-10-18 MED ORDER — FENTANYL CITRATE (PF) 100 MCG/2ML IJ SOLN
INTRAMUSCULAR | Status: AC
  Filled 2023-10-18: qty 2

## 2023-10-18 MED ORDER — TRAZODONE HCL 50 MG PO TABS: 25.0000 mg | ORAL_TABLET | Freq: Every evening | ORAL | Status: DC | PRN

## 2023-10-18 NOTE — Progress Notes (Signed)
 Pharmacy Antibiotic Note  Garrett Ferguson is a 78 y.o. male admitted on 10/18/2023 with sepsis.  Pharmacy has been consulted for vancomycin dosing.  Plan: Vancomycin 2gm IV x 1 then 1750mg  q24h (AUC 474.1, Scr used 0.8) Follow renal function, cultures and clinical course  Height: 5\' 11"  (180.3 cm) Weight: 103 kg (227 lb 1.2 oz) IBW/kg (Calculated) : 75.3  Temp (24hrs), Avg:91.7 F (33.2 C), Min:88.4 F (31.3 C), Max:97.7 F (36.5 C)  Recent Labs  Lab 10/18/23 1250 10/18/23 1435 10/18/23 1542  WBC 4.0  --   --   CREATININE 0.59*  --   --   LATICACIDVEN 2.0* 0.9 1.1    Estimated Creatinine Clearance: 93 mL/min (A) (by C-G formula based on SCr of 0.59 mg/dL (L)).    Allergies  Allergen Reactions   Sulfamethoxazole-Trimethoprim    Diclofenac Sodium Rash   Latex Rash   Levofloxacin Rash    Pt developed red spots on leg and itching 2-3 hours after taking po Levaquin   Penicillins Rash    Antimicrobials this admission: 3/6 cefepime >> 3/6 CTX x1 3/6 vanc >>  Dose adjustments this admission:   Microbiology results: 3/6 BCx:  3/6 UCx:   3/6 Sputum:   Thank you for allowing pharmacy to be a part of this patient's care.  Arley Phenix RPh 10/18/2023, 10:22 PM

## 2023-10-18 NOTE — ED Notes (Signed)
 Verbal consult for critical care called @[phone]  to page Dr Estell Harpin [phone]

## 2023-10-18 NOTE — Progress Notes (Signed)
 eLink Physician-Brief Progress Note Patient Name: Garrett Ferguson DOB: 26-Feb-1946 MRN: [id number]   Date of Service  10/18/2023  HPI/Events of Note  79 year old male with extensive history of bladder cancer, HTN, restless leg, chronic lymphedema, peripheral artery disease, CKD, GERD who presented from home via EMS with dysuria, altered mental status, hypotensive.   Found to have evidence of UTI.  Head CT was negative.  Arrived to ICU at [facility] with decreased LOC and he underwent intubation.  Evaluation ongoing.  eICU Interventions  Chart reviewed     Intervention Category Evaluation Type: New Patient Evaluation  Henry Russel, Demetrius Charity 10/18/2023, 7:37 PM

## 2023-10-18 NOTE — Sepsis Progress Note (Signed)
 Code Sepsis protocol being monitored by eLink.

## 2023-10-18 NOTE — Progress Notes (Signed)
 Chaplain rec'd page. Chaplain spoke to RN who stated that pt coded and family req'd support. However, doctor spoke to family while chaplain was en route, and family left hospital before chaplain could meet with them. Chaplain will have Chaplain Dyanne Carrel follow-up with family during daytime hours on Friday 3/7.

## 2023-10-18 NOTE — ED Provider Notes (Signed)
 Procedure Name: Intubation Date/Time: 10/18/2023 7:46 PM  Performed by: Rondel Baton, MDPre-anesthesia Checklist: Patient identified, Emergency Drugs available, Suction available and Patient being monitored Oxygen Delivery Method: Ambu bag Preoxygenation: Pre-oxygenation with 100% oxygen Induction Type: IV induction and Rapid sequence Ventilation: Mask ventilation with difficulty and Two handed mask ventilation required Laryngoscope Size: Glidescope and 4 Grade View: Grade I Tube size: 7.5 mm Number of attempts: 1 Airway Equipment and Method: Video-laryngoscopy Placement Confirmation: ETT inserted through vocal cords under direct vision, Positive ETCO2 and Breath sounds checked- equal and bilateral Secured at: 22 cm Tube secured with: ETT holder Dental Injury: Teeth and Oropharynx as per pre-operative assessment  Comments: Patient became altered and went into respiratory distress.  I was called to the bedside and intubated the patient emergently using etomidate 25 mg and rocuronium 100 mg.  Patient remained stable after the intubation. ICU attending was present via telemedicine.         Rondel Baton, MD 10/18/23 [phone]

## 2023-10-18 NOTE — Progress Notes (Signed)
 NAME:  Garrett Ferguson, MRN:  [id number], DOB:  10-21-1945, LOS: 0 ADMISSION DATE:  10/18/2023, CONSULTATION DATE:  10/18/23 REFERRING MD:  Dr. Flossie Dibble, CHIEF COMPLAINT:  AMS   Brief HPI:  78 y/o male with PMH for Bladder cancer, HTN, RLS, Chronic Lymphedema LE, PAD, CKD, GERD who had a fall at home (had fall night before as well but EMS checked his vitals and did not find him to a candidate for hospital), BIBA to [facility].  Verbal at home but then had a seizure.  Seizure lasting about 2 min per EMS, BG 109.  EMS placed in on 100% NRM and in the ED he had a nasal trumpet placed.  He was confused and agitated.  He was hypothermic 32.4 and Hypotensive 78/50 LA 2.0.  RVR, Influenza A and Covid negative.  UA positive for cloudy and increased WBC.  Sepsis protocol started.  He was transferred to [facility] ICU.  He presented unresponsive no response to pain/sternal rubs.  Presented with Albumin and Levophed running.  He did not have a aga either.  Code called for respiratory arrest and Dr Jarold Motto intubated the patient.  No loss of pulse, no CPR done.   Pertinent  Medical History  HTN, bladder cancer, urethral stricture s/p dilation 2022 with ongoing incomplete emptying/ LUTS CKD, HLD, PAD, GERD, restless leg, chronic lymphedema, osteoarthritis, chronic pain (left knee after multiple surgeries, followed by [facility] pain management- on gabapentin, oxycodone, Xtampza), anxiety/ depression, PUD, basal cell carcinoma, anisocoria  Significant Hospital Events: Including procedures, antibiotic start and stop dates in addition to other pertinent events   3/6 tx from [facility] ER to [facility], urosepsis, AMS, intubated on arrival   Interim History / Subjective:  Foley replaced overnight to temp foley but exchanged due to latex allergy with increased hematuria and some clots  Objective   Blood pressure (!) 70/40, pulse 70, temperature (!) 93.7 F (34.3 C), resp. rate 12, SpO2 98%.        Intake/Output Summary (Last 24 hours) at  10/18/2023 1726 Last data filed at 10/18/2023 1614 Gross per 24 hour  Intake 3200 ml  Output --  Net 3200 ml   There were no vitals filed for this visit.  Examination: Fent 75, dex 0.6 General:  AoC ill appearing elderly male sedated on MV HEENT: MM pink/moist, edentulous, ETT/ OGT-bilious, pupils 3/r, +JVD, no nuchal rigidity Neuro: sedated, some resistance in LUE, spont movement in RLE but otherwise no response to noxious stimuli CV: rr, SB-SR, no murmur, LUE DL PICC PULM:  MV supported, not breathing over set rate, coarse with some whitish secretions, +cough GI: soft, bs+, ND, foley now out currently out- blood around meatus with bloody urine, minimal clots-  Extremities: warm/dry, slight edema in UE, chronic LE lymphedema as below, warm, erythema and LLE with slight serous drainage, no odor  Skin: posterior not examined     NE at 15 mcg Labs reviewed UOP 500 ml overnight  Resolved Hospital Problem list    Assessment & Plan:   Shock, suspected urosepsis, LE cellulitis and r/o aspiration PNA Hypothermia, resolved - cont NE for MAP goal > 65, add vaso  - check CVP> 15.  Pending echo consider diuresis as appears volume overloaded on exam - change cefepime to ctx/ flagyl given concern for seizure activity, cont vanc - check MRSA PCR/ trach asp - follow blood and urine cultures - trend CBC/ fever curve  - check echo   Acute encephalopathy, suspected metabolic R/o seizure -  concern for seizure activity ~2 mins per EMS reports.  No seizure hx.  Could be related to possible hypotension/ perfusion, hypoxia, vs withdrawal syndrome as on chronic opioids - CTH neg P:  - EEG today, no evidence of clinical seizures overnight - serial neuro exams - consider neuro consult/ AEDs if any further concern for seizure activity - seizure precautions - neuro protective measures   Acute respiratory insufficiency requiring intubation w/ aspiration PNA - intubated for airway protection  3/6 P:  - cont full MV support, 4-8cc/kg IBW with goal Pplat <30 and DP<15  - VAP prevention protocol/ PPI - PAD protocol for sedation> propofol changed to dex due to elevated triglycerides, fentanyl gtt> RASS goal 0/-1, scheduled bowel regimen.  Add enteral oxy given chronic opioid use to minimize gtt - intermittent CXR/ ABG - wean FiO2 as able for SpO2 >92% - daily SAT & SBT when appropriate  - check trach asp, MRSA PCR and urine strep  Hematuria - traumatic due to foley insertion - Urology consulted, appreciate input.   - follow UOP/ renal indices closely   Prolonged Qtc - tele monitoring, trend - optimize electrolytes - avoid QTC prolonging meds  HTN HLD - hold ASA, lasix, lisinopril, and metoprolol while on pressors - hold statin with mildly elevated LFTs, remains NPO  Hypokalemia, resolved - replete prn, trend on BMET.  Mag 2  Transaminitis, mild - suspected to septic shock, LFTs better, trend periodically   Normocytic anemia- at baseline - trend CBC  Thrombocytopenia, resolved - likely related to sepsis, trend on CBC  Restless leg Chronic pain 2/2 chronic left knee pain - hold pta pramipexole, oxy IR, oxy ER, gabapentin   Depression /anxiety - hold wellbutrin, sertraline   Deconditioning/ immobility- wheelchair dependent  Falls - PT/ OT when appropriate   - wife updated by phone.  Reports pt not himself for a week.  Mostly wheelchair bound, wounds on leg from his fall at home, from 2-3 falls at home over last 2-3 days.    Best Practice (right click and "Reselect all SmartList Selections" daily)   Diet/type: NPO; TF per RD/ EN DVT prophylaxis prophylactic heparin  Pressure ulcer(s): N/A GI prophylaxis: PPI Lines: Central line Foley:  Yes, and it is still needed Code Status:  full code Last date of multidisciplinary goals of care discussion [3/7]  Labs   CBC: Recent Labs  Lab 10/18/23 1250  WBC 4.0  NEUTROABS 3.0  HGB 11.6*  HCT 37.0*  MCV  91.8  PLT 149*    Basic Metabolic Panel: Recent Labs  Lab 10/18/23 1250  NA 139  K 3.2*  CL 109  CO2 25  GLUCOSE 112*  BUN 25*  CREATININE 0.59*  CALCIUM 8.5*   GFR: CrCl cannot be calculated (Unknown ideal weight.). Recent Labs  Lab 10/18/23 1250 10/18/23 1435 10/18/23 1542  WBC 4.0  --   --   LATICACIDVEN 2.0* 0.9 1.1    Liver Function Tests: Recent Labs  Lab 10/18/23 1250  AST 57*  ALT 45*  ALKPHOS 97  BILITOT 0.6  PROT 6.8  ALBUMIN 3.3*   No results for input(s): "LIPASE", "AMYLASE" in the last 168 hours. No results for input(s): "AMMONIA" in the last 168 hours.  ABG No results found for: "PHART", "PCO2ART", "PO2ART", "HCO3", "TCO2", "ACIDBASEDEF", "O2SAT"   Coagulation Profile: Recent Labs  Lab 10/18/23 1250  INR 1.1    Cardiac Enzymes: No results for input(s): "CKTOTAL", "CKMB", "CKMBINDEX", "TROPONINI" in the last 168 hours.  HbA1C:  No results found for: "HGBA1C"  CBG: Recent Labs  Lab 10/18/23 1207  GLUCAP 99   Allergies Allergies  Allergen Reactions   Sulfamethoxazole-Trimethoprim    Diclofenac Sodium Rash   Latex Rash   Levofloxacin Rash    Pt developed red spots on leg and itching 2-3 hours after taking po Levaquin   Penicillins Rash     Home Medications  Prior to Admission medications   Medication Sig Start Date End Date Taking? Authorizing Provider  metoprolol succinate (TOPROL-XL) 50 MG 24 hr tablet Take 50 mg by mouth daily. 09/19/23  Yes [provider]  oxyCODONE ER 9 MG C12A Take by mouth. 09/19/23  Yes [provider]  aspirin EC 81 MG tablet Take 81 mg by mouth daily.    [provider]  atorvastatin (LIPITOR) 10 MG tablet Take 10 mg by mouth daily.    [provider]  benzonatate (TESSALON) 100 MG capsule Take 1 capsule (100 mg total) by mouth every 8 (eight) hours. 08/16/19   Avegno, Zachery Dakins, FNP  buPROPion (WELLBUTRIN XL) 150 MG 24 hr tablet Take 150 mg by mouth daily. 10/13/16    [provider]  cefdinir (OMNICEF) 300 MG capsule Take 1 capsule (300 mg total) by mouth every 12 (twelve) hours. 03/23/23   Catarina Hartshorn, MD  cholecalciferol (VITAMIN D3) 25 MCG (1000 UNIT) tablet Take 1,000 Units by mouth daily.    [provider]  furosemide (LASIX) 20 MG tablet Take 20 mg by mouth every other day. 08/18/16   [provider]  gabapentin (NEURONTIN) 300 MG capsule TAKE 1 CAPSULE BY MOUTH IN THE MORNING, ONE CAPSULE AT NOON AND TWO CAPSULES AT NIGHT Patient taking differently: Take 300 mg by mouth See admin instructions. Take 1 capsule by mouth in the morning, 1 capsule at noon, and 2 capsules at night. 05/24/17   Nilda Riggs, NP  lisinopril (ZESTRIL) 20 MG tablet Take 20 mg by mouth daily.    [provider]  Multiple Vitamin (MULTIVITAMIN WITH MINERALS) TABS Take 1 tablet by mouth daily.    [provider]  naphazoline-glycerin (CLEAR EYES REDNESS) 0.012-0.25 % SOLN Place 2 drops into the left eye 4 (four) times daily as needed for eye irritation. 03/23/23   Catarina Hartshorn, MD  oxyCODONE (OXY IR/ROXICODONE) 5 MG immediate release tablet Take 5 mg by mouth 2 (two) times daily as needed.    [provider]  pramipexole (MIRAPEX) 1 MG tablet TAKE 1 TABLET BY MOUTH TWICE DURING THE DAY AND ONE AND ONE-HALF TABLETS AT NIGHT 10/18/16   Nilda Riggs, NP  PREDNISOLONE ACETATE P-F 1 % ophthalmic suspension Place 1 drop into the right eye 3 (three) times daily.    [provider]  sertraline (ZOLOFT) 100 MG tablet Take 200 mg by mouth daily.    [provider]  tamsulosin (FLOMAX) 0.4 MG CAPS capsule Take 0.4 mg by mouth daily.    [provider]  zinc gluconate 50 MG tablet Take 50 mg by mouth daily.    [provider]     Critical care time: 40 mins       Posey Boyer, MSN, AG-ACNP-BC [facility] 10/19/2023, 10:54 AM  See Amion for pager If no response to pager  , please call [phone] until 7pm After 7:00 pm call Elink  [phone]

## 2023-10-18 NOTE — Progress Notes (Signed)
     Patient Name: JAKYRI BRUNKHORST           DOB: 25-Dec-1945  MRN: [id number]      Admission Date: 10/18/2023  Attending Provider: Kendell Bane, MD  Primary Diagnosis: Severe sepsis (HCC)   Level of care: ICU   OVERNIGHT PROGRESS REPORT   Responded to medical alert.  Concerns for respiratory arrest and poor airway protection. No gag reflex. Currently being bagged by RT.   Patient received Ativan 0.5 mg x 2 for agitation in the ER, but this was sometime ago. He is significantly unresponsive and unable to follow commands. Currently requiring Levophed for hypotension.   PCCM already aware of this patient. Per E link, ground team PCCM will be seeing this patient tonight.  ED physician at bedside assessing airway and preparing to intubate. Family has been updated. Wife, Kathie Rhodes, is very tearful and emotional. She is having a hard time coping with his critical change. Chaplain service has been called by Diplomatic Services operational officer.     Anthoney Harada, DNP, ACNPC- AG [facility]

## 2023-10-18 NOTE — Progress Notes (Signed)
 Peripherally Inserted Central Catheter Placement  The IV Nurse has discussed with the patient and/or persons authorized to consent for the patient, the purpose of this procedure and the potential benefits and risks involved with this procedure.  The benefits include less needle sticks, lab draws from the catheter, and the patient may be discharged home with the catheter. Risks include, but not limited to, infection, bleeding, blood clot (thrombus formation), and puncture of an artery; nerve damage and irregular heartbeat and possibility to perform a PICC exchange if needed/ordered by physician.  Alternatives to this procedure were also discussed.  Bard Power PICC patient education guide, fact sheet on infection prevention and patient information card has been provided to patient /or left at bedside.  Consent given via telephone by wife due to pt confusion. Second RN verified.   PICC Placement Documentation  PICC Double Lumen 10/18/23 Right Basilic 46 cm 2 cm (Active)  Indication for Insertion or Continuance of Line Vasoactive infusions 10/18/23 1817  Exposed Catheter (cm) 2 cm 10/18/23 1817  Site Assessment Clean, Dry, Intact 10/18/23 1817  Lumen #1 Status Flushed;Saline locked;Blood return noted 10/18/23 1817  Lumen #2 Status Flushed;Saline locked;Blood return noted 10/18/23 1817  Dressing Type Transparent 10/18/23 1817  Dressing Status Antimicrobial disc/dressing in place 10/18/23 1817  Line Care Lumen 1 cap changed;Lumen 2 cap changed;Connections checked and tightened 10/18/23 1817  Dressing Change Due 10/25/23 10/18/23 1817       Andrey Cota 10/18/2023, 6:18 PM

## 2023-10-18 NOTE — Hospital Course (Addendum)
 Garrett Ferguson is a 78 year old male with history of bladder cancer,HTN,restless leg syndrome, chronic lymphedema of B/L lower extremity, peripheral artery disease, GERD who was brought initially to [facility] after he fell at home.  EMS found him to be seizing.  He was confused and agitated afterwards, hypothermic, hypotensive then became unresponsive.   On presentation, UA was suspicious for UTI.  Sepsis protocol started.  Transferred to [facility] long ICU.  Code called for respiratory arrest that started on vasopressors, intubated.  No loss of pulse or CPR done.  Palliative care consulted for goals of care discussion.  Currently DNR.  Patient transferred to [facility] service on 3/14.  Clinically improving.  PT/OT recommending SNF on discharge.   Important events: 3/6 tx from [facility] ER to [facility], urosepsis, AMS, intubated on arrival  3/7 difficulty placing foley, ended up having trauma and urology was consulted 3/9 Off of pressors. Documented SBT x 9 hours. made DNR w palliative care.  3/10 Fib RVR w SBT; back to full support, NSR w amio gtt. Abx to doxy rocephin  3/11 fever, ongoing pressors. Weaned for a few hours in the late afternoon but was pretty tired.  3/12 one-way extubation, DNR/ DNI Weaned to room air   A&P:  Hematuria - Likely from traumatic catheterization.  Foley placed by urology on 3/8.  Recommendation is to continue for at least 7 days before restarting voiding trial.   - Voiding trial no sooner than 3/16; let's keep another couple days as he's just now starting to recover steadily; also still has notable hematuria draining from around catheter and will need to wait until this resolves  Left forearm probable SVT - appears to have probably SVT in left forearm but given size and prominence will obtain u/s to check for DVT as well  Acute hypoxic respiratory failure -resolved - Suspected to be from community-acquired pneumonia, septic shock.  Initially intubated, now extubated and on  nasal cannula.   Goal is to wean oxygen.  Continue pulmonary hygiene, flutter valve, incentive spirometer, bronchodilators as needed. As per goals of care discussion, okay for BiPAP if needed but transition to comfort care if continues to decline. - remains on RA   Septic shock - resolved  Thought to be secondary to pneumonia, possible left lower extremity cellulitis.Completed 7 days course of doxycycline and ceftriaxone on 3/12.  Cultures have remained negative so far.   Dysphagia - Speech therapy following.  Started on regular diet    A-fib with RVR - resolved  - Went into rapid A-fib on 3/10 but spontaneously converted to normal sinus rhythm with amiodarone.  - This was most likely associated with acute respiratory distress.   - Currently on aspirin, statin.  Anticoagulation not felt to be indicated as afib precipitated in setting of acute illness/shock   Normocytic anemia - Currently stable.  Continue to monitor   History of chronic lymphedema - Continue supportive care, wound care was consulted   Frequent falls/physical deconditioning - Mostly wheelchair dependent at home since March 15, 2023.  PT/OT consulted.  SNF recommended   Hyperglycemia - Currently on sliding scale.  A1c of 5.8.  Blood sugar stable now   History of restless leg syndrome/chronic pain syndrome/anxiety/depression - On as needed oxycodone, hydroxyzine   Goals of care - Palliative care following.  DNR/DNI - Goal is to continue current care but family opting for comfort care if further declines.  Patient is stable at this time but overall low functioning

## 2023-10-18 NOTE — Progress Notes (Signed)
 PROGRESS NOTE    Patient: Garrett Ferguson                            PCP: [facility]                    DOB: 05/10/1946            DOA: 10/18/2023 NWG:[id number]             DOS: 10/18/2023, 4:42 PM   LOS: 0 days   Date of Service: The patient was seen and examined on 10/18/2023  Subjective:   Patient was seen and evaluated again, remain hypotensive, hypothermic    Brief Narrative:   Garrett Ferguson is a 78 year old male with extensive history of bladder cancer, HTN, restless leg storming, chronic lymphedema, peripheral artery disease, CKD, GERD... Presented from home via EMS with dysuria, altered mental status, hypotensive. Patient is awake, confused, agitated does not provide meaningful history.   ED evaluation/course: Blood pressure (!) 78/50, pulse 67, temperature (!) 90.4 F (32.4 C), resp. rate 18, SpO2 99%. Labs: Sodium 139, potassium 3.2, glucose 112, BUN 25, creatinine 0.59, calcium 8.5, albumin 3.3, low, AST 57, ALT 45, CBC: WBC 4.0, hemoglobin 11.6, Lactic acid 2.0 Respiratory panel, RSV, influenza A, B, SARS-CoV-2 all negative UA: Cloudy, large hemoglobin, moderate leukocyte, positive for protein, > 50 WBC CT head-negative Blood and urine cultures were obtained.  Patient met sepsis criteria, per sepsis protocol, patient was resuscitated with IV fluids, IV antibiotics of Rocephin  EDP discussed the patient with PCCM   Assessment / Plan:   Principal Problem:   Severe sepsis (HCC) Active Problems:   UTI (urinary tract infection)   Hypotensive episode   Lymphedema   Hypertension   History of bladder cancer   Hypoalbuminemia  Septic shock -with toxic metabolic encephalopathy -Source of infection likely urinary  Patient meets severe sepsis criteria, with hypothermia, temp of 90.4, Hypotensive, BP of 78/50 with lactic acidosis, and change in mental status  --Admitting patient to ICU setting, with close observation  -Will follow-up with blood and  urine cultures -Lactic acid 2.0 -Per sepsis pathway, patient has received 3 L of LR in ED, will continue with IV fluid resuscitation, -Received IV antibiotic Rocephin, >>>> changed to Cefepime    Hypotensive -Due to septic shock -S/p 3 L of bolus fluid of LR, will continue with fluid resuscitation -Replacing albumin -Initiating midodrine 10 mg p.o. 3 times daily>>> patient is encephalopathic unable to tolerate p.o. -Initiating Levophed gtt      Called PCCM, discussed the case with Dr. Michae Kava in detail Will obtain PICC line, starting Levophed, broadening IV antibiotics to cefepime  Dr. Michae Kava has recommended and accepted the patient to be admitted and transferred to ICU at [facility])      ------------------------------------------------------------------------------------------------------------------------------------------------  DVT prophylaxis:  heparin injection 5,000 Units Start: 10/18/23 2200 TED hose Start: 10/18/23 1442 SCDs Start: 10/18/23 1442   Code Status:   Code Status: Full Code  Family Communication: Called his wife -Advance care planning has been discussed.   Admission status:    Transferring to [facility], ICU     Procedures:   No admission procedures for hospital encounter.   Antimicrobials:  Anti-infectives (From admission, onward)    Start     Dose/Rate Route Frequency Ordered Stop   10/18/23 2200  ceFEPIme (MAXIPIME) 2 g in sodium chloride 0.9 % 100 mL IVPB  2 g 200 mL/hr over 30 Minutes Intravenous Every 8 hours 10/18/23 1533 10/25/23 2159   10/18/23 1530  ceFEPIme (MAXIPIME) 2 g in sodium chloride 0.9 % 100 mL IVPB        2 g 200 mL/hr over 30 Minutes Intravenous  Once 10/18/23 1446 10/18/23 1614   10/18/23 1215  cefTRIAXone (ROCEPHIN) 2 g in sodium chloride 0.9 % 100 mL IVPB        2 g 200 mL/hr over 30 Minutes Intravenous Once 10/18/23 1206 10/18/23 1325        Medication:   aspirin EC  81 mg  Oral Daily   gabapentin  100 mg Oral BID   heparin  5,000 Units Subcutaneous Q8H   midodrine  10 mg Oral TID WC   [START ON 10/19/2023] multivitamin with minerals  1 tablet Oral Daily   [START ON 10/19/2023] oxyCODONE  10 mg Oral Q12H   potassium chloride  40 mEq Oral Once   pramipexole  0.125 mg Oral TID   sertraline  200 mg Oral Daily   sodium chloride flush  3 mL Intravenous Q12H   sodium chloride flush  3 mL Intravenous Q12H    acetaminophen **OR** acetaminophen, bisacodyl, haloperidol lactate, hydrALAZINE, HYDROmorphone (DILAUDID) injection, ipratropium, levalbuterol, ondansetron **OR** ondansetron (ZOFRAN) IV, oxyCODONE, senna-docusate, sodium phosphate, traZODone   Objective:   Vitals:   10/18/23 1435 10/18/23 1515 10/18/23 1615 10/18/23 1635  BP: (!) 78/50 (!) 78/45 (!) 79/55 (!) 70/40  Pulse: 67 68 70   Resp: 18 14 12    Temp: (!) 90.4 F (32.4 C) (!) 91.8 F (33.2 C) (!) 93.7 F (34.3 C)   TempSrc:      SpO2: 99% 98% 98%     Intake/Output Summary (Last 24 hours) at 10/18/2023 1642 Last data filed at 10/18/2023 1614 Gross per 24 hour  Intake 3200 ml  Output --  Net 3200 ml   There were no vitals filed for this visit.   Physical examination:   Constitution: Still confused, agitated  HEENT:        Normocephalic, PERRL, otherwise with in Normal limits  Chest:         Chest symmetric Cardio vascular:  S1/S2, RRR, No murmure, No Rubs or Gallops  pulmonary: Clear to auscultation bilaterally, respirations unlabored, negative wheezes / crackles Abdomen: Soft, non-tender, non-distended, bowel sounds,no masses, no organomegaly Muscular skeletal: Limited exam - in bed, able to move all 4 extremities,   Neuro: CNII-XII intact. , normal motor and sensation, reflexes intact  Extremities: No pitting edema lower extremities, +2 pulses  Skin: Dry, warm to touch, negative for any Rashes, No open wounds Wounds: per nursing  documentation   ------------------------------------------------------------------------------------------------------------------------------------------    LABs:     Latest Ref Rng & Units 10/18/2023   12:50 PM 03/24/2023    4:53 AM 03/23/2023    4:21 AM  CBC  WBC 4.0 - 10.5 K/uL 4.0  8.5  14.0   Hemoglobin 13.0 - 17.0 g/dL 16.1  09.6  04.5   Hematocrit 39.0 - 52.0 % 37.0  37.1  37.9   Platelets 150 - 400 K/uL 149  164  156       Latest Ref Rng & Units 10/18/2023   12:50 PM 03/24/2023    4:53 AM 03/23/2023    4:21 AM  CMP  Glucose 70 - 99 mg/dL 409  98  811   BUN 8 - 23 mg/dL 25  20  19    Creatinine 0.61 - 1.24  mg/dL 4.09  8.11  9.14   Sodium 135 - 145 mmol/L 139  131  131   Potassium 3.5 - 5.1 mmol/L 3.2  4.0  3.9   Chloride 98 - 111 mmol/L 109  98  101   CO2 22 - 32 mmol/L 25  22  22    Calcium 8.9 - 10.3 mg/dL 8.5  8.1  8.2   Total Protein 6.5 - 8.1 g/dL 6.8     Total Bilirubin 0.0 - 1.2 mg/dL 0.6     Alkaline Phos 38 - 126 U/L 97     AST 15 - 41 U/L 57     ALT 0 - 44 U/L 45        Radiology Reports [place] EKG SITE RITE Result Date: 10/18/2023 If Site Rite image not attached, placement could not be confirmed due to current cardiac rhythm.  CT Head Wo Contrast Result Date: 10/18/2023 CLINICAL DATA:  Fall, seizure, neuro deficits/altered mental status. EXAM: CT HEAD WITHOUT CONTRAST TECHNIQUE: Contiguous axial images were obtained from the base of the skull through the vertex without intravenous contrast. RADIATION DOSE REDUCTION: This exam was performed according to the departmental dose-optimization program which includes automated exposure control, adjustment of the mA and/or kV according to patient size and/or use of iterative reconstruction technique. COMPARISON:  CT head 10/15/2022 FINDINGS: Brain: No acute intracranial hemorrhage. No CT evidence of acute infarct. Nonspecific hypoattenuation in the periventricular and subcortical white matter favored to reflect chronic  microvascular ischemic changes. No edema, mass effect, or midline shift. The basilar cisterns are patent. Ventricles: Prominence of the ventricles suggesting underlying parenchymal volume loss. Vascular: No hyperdense vessel or unexpected calcification. Skull: No acute or aggressive finding. Orbits: Orbits are symmetric. Sinuses: Postsurgical changes of the right maxillary sinus. Mucosal thickening in the ethmoid sinuses. Other: Mastoid air cells are clear. IMPRESSION: 1. No CT evidence of acute intracranial abnormality. 2. Chronic microvascular ischemic changes and parenchymal volume loss. Electronically Signed   By: Emily Filbert M.D.   On: 10/18/2023 14:22   DG Chest Port 1 View Result Date: 10/18/2023 CLINICAL DATA:  Possible sepsis. EXAM: PORTABLE CHEST 1 VIEW COMPARISON:  March 21, 2023. FINDINGS: Stable cardiomegaly with mild central pulmonary vascular congestion. Minimal pulmonary edema may be present. Bony thorax is unremarkable. IMPRESSION: Stable cardiomegaly with mild central pulmonary vascular congestion and possible minimal pulmonary edema. Electronically Signed   By: Lupita Raider M.D.   On: 10/18/2023 14:01    SIGNED: Kendell Bane, MD, FHM. FAAFP. [facility] - [facility] Critical care time spent - 35 min.  In seeing, evaluating and examining the patient. Reviewing medical records, labs, drawn plan of care. [facility],  Pager (please use [email or web address] to page/ text) Please use Epic Secure Chat for non-urgent communication (7AM-7PM)  If 7PM-7AM, please contact night-coverage [email or web address], 10/18/2023, 4:42 PM

## 2023-10-18 NOTE — ED Notes (Signed)
 Assessment and care complicated due to altered mental status. Pt persistently pulling cables off of him, moving bair hugger blanket, and grabbing arms of staff attempted to calm him.

## 2023-10-18 NOTE — Significant Event (Addendum)
 Called to come to newly admitted patient at approximately 41. Patient just received from [facility] via [place]. Patient unresponsive to any type of stimuli, not breathing, and no gag reflex present. Restraints found on wrists from [facility], but not tied down, fully discontinued. E-link MD Dr. Katrinka Blazing present via virtual face-face camera. Called code for respiratory arrest WITHOUT loss of pulse. Dr. Eloise Harman ED responded to code and emergently intubated. Patient already on pressors and Albumin upon arrival to unit.  Dr. Sherryll Burger from critical care service arrived shortly after intubation and assumed care.  Lamona Curl, RN

## 2023-10-18 NOTE — H&P (Addendum)
 NAME:  LENY MOROZOV, MRN:  [id number], DOB:  1946-08-11, LOS: 0 ADMISSION DATE:  10/18/2023, CONSULTATION DATE:  10/18/23 REFERRING MD:  Dr Flossie Dibble, CHIEF COMPLAINT:  AMS  History of Present Illness:  78 y/o male with PMH for Bladder cancer, HTN, RLS, Chronic Lymphedema LE, PAD, CKD, GERD who had a fall at home (had fall night before as well but EMS checked his vitals and did not find him to a candidate for hospital), BIBA to [facility].  Verbal at home but then had a seizure.  Seizure lasting about 2 min per EMS, BG 109.  EMS placed in on 100% NRM and in the ED he had a nasal trumpet placed.  He was confused and agitated.  He was hypothermic 32.4 and Hypotensive 78/50 LA 2.0.  RVR, Influenza A and Covid negative.  UA positive for cloudy and increased WBC.  Sepsis protocol started.  He was transferred to [facility] ICU.  He presented unresponsive no response to pain/sternal rubs.  Presented with Albumin and Levophed running.  He did not have a aga either.  Code called for respiratory arrest and Dr Jarold Motto intubated the patient.  No loss of pulse, no CPR done.    Pertinent  Medical History  Bladder Cancer HTN Lymphedema PAD CKD Seizure  Significant Hospital Events: Including procedures, antibiotic start and stop dates in addition to other pertinent events   3/6 Transferred from [facility] to [facility], Intubated at [facility], Urosepsis, AMS, Seizure prior to initial ER arrival  Interim History / Subjective:    Objective   Blood pressure (!) 80/62, pulse 66, temperature (!) 94.8 F (34.9 C), resp. rate 15, height 5\' 11"  (1.803 m), SpO2 100%.    Vent Mode: PRVC FiO2 (%):  [100 %] 100 % Set Rate:  [15 bmp] 15 bmp Vt Set:  [600 mL] 600 mL PEEP:  [5 cmH20] 5 cmH20 Plateau Pressure:  [19 cmH20] 19 cmH20   Intake/Output Summary (Last 24 hours) at 10/18/2023 2009 Last data filed at 10/18/2023 1614 Gross per 24 hour  Intake 3200 ml  Output --  Net 3200 ml   There were no vitals filed for this  visit.  Examination: General: patient paralyzed and intubated NAD HENT: pupils small poorly reactive, no icterus Lungs: CTA b/l no wheezes no rales no rhonchi, symmetric chest excursions b/l Cardiovascular: reg s1s2 no murmurs no gallops Abdomen: soft nt nd bs sluggish at best Extremities: chronic changes LE, edema/discoloration b/l Neuro: paralyzed post intubation, obtunded apparently on arrival to [facility] Problem list   N/a  Assessment & Plan:  Acute Hypoxic Respiratory Failure  From sepsis and AMS  Intubated at [facility] Vent management, follow ABGs Altered Mental Status  Most likely from sepsis  CT head no acute findings Septic Shock Patient on LR 1500 cc/hr Levophed at 17mcg/min Broad spectrum antibiotics UTI Currently on Cefepime, add Vancomycin to broaden coverage B. Urine culture and blood cultures ordered and pending Hypothermia  Warming blanket  From Sepsis Seizure/Seizure d/o  EEG in morning  Neurology consult in A.M.  No prior h/o seizure per wife Best Practice (right click and "Reselect all SmartList Selections" daily)   Diet/type: NPO DVT prophylaxis prophylactic heparin  Pressure ulcer(s): N/A GI prophylaxis: H2B Lines: PICC line Foley:  Yes, and it is still needed Code Status:  full code   Labs   CBC: Recent Labs  Lab 10/18/23 1250  WBC 4.0  NEUTROABS 3.0  HGB 11.6*  HCT 37.0*  MCV 91.8  PLT 149*    Basic Metabolic Panel: Recent Labs  Lab 10/18/23 1250  NA 139  K 3.2*  CL 109  CO2 25  GLUCOSE 112*  BUN 25*  CREATININE 0.59*  CALCIUM 8.5*  MG 2.0  PHOS 3.4   GFR: CrCl cannot be calculated (Unknown ideal weight.). Recent Labs  Lab 10/18/23 1250 10/18/23 1435 10/18/23 1542  PROCALCITON  --   --  <0.10  WBC 4.0  --   --   LATICACIDVEN 2.0* 0.9 1.1    Liver Function Tests: Recent Labs  Lab 10/18/23 1250  AST 57*  ALT 45*  ALKPHOS 97  BILITOT 0.6  PROT 6.8  ALBUMIN 3.3*   No results for input(s):  "LIPASE", "AMYLASE" in the last 168 hours. No results for input(s): "AMMONIA" in the last 168 hours.  ABG No results found for: "PHART", "PCO2ART", "PO2ART", "HCO3", "TCO2", "ACIDBASEDEF", "O2SAT"   Coagulation Profile: Recent Labs  Lab 10/18/23 1250  INR 1.1    Cardiac Enzymes: No results for input(s): "CKTOTAL", "CKMB", "CKMBINDEX", "TROPONINI" in the last 168 hours.  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   Surgical History:   Past Surgical History:  Procedure Laterality Date   BLADDER SURGERY     transitional cell carcinoma   carpall tunnel     L hand   CATARACT EXTRACTION W/PHACO Right 02/23/2023   Procedure: CATARACT EXTRACTION PHACO AND INTRAOCULAR LENS PLACEMENT (IOC);  Surgeon: Pecolia Ades, MD;  Location: [facility];  Service: Ophthalmology;  Laterality: Right;  CDE 11.94   CHOLECYSTECTOMY  08/14/1977   FOOT SURGERY     x5   JOINT REPLACEMENT Right replacements x3   l knee with multiple surg on same knee   NASAL SINUS SURGERY  04/14/1989   PROSTATE BIOPSY     TOTAL KNEE REVISION  04/23/2012   Procedure: TOTAL KNEE REVISION;  Surgeon: Eulas Post, MD;  Location: [facility];  Service: Orthopedics;  Laterality: Left;   TRANSURETHRAL RESECTION OF PROSTATE       Social History:   reports that he has never smoked. He has never used smokeless tobacco. He reports that he does not drink  alcohol and does not use drugs.   Family History:  His family history includes Cancer in his father and another family member; Heart disease in his sister; High blood pressure in an other family member; Melanoma in his father and another family member.   Allergies Allergies  Allergen Reactions   Sulfamethoxazole-Trimethoprim    Diclofenac Sodium Rash   Latex Rash   Levofloxacin Rash    Pt developed red spots on leg and itching 2-3 hours after taking po Levaquin   Penicillins Rash     Home Medications  Prior to Admission medications   Medication Sig Start Date End Date Taking? Authorizing Provider  metoprolol succinate (TOPROL-XL) 50 MG 24 hr tablet Take 50 mg by mouth daily. 09/19/23  Yes [provider]  oxyCODONE ER 9 MG C12A  Take by mouth. 09/19/23  Yes [provider]  aspirin EC 81 MG tablet Take 81 mg by mouth daily.    [provider]  atorvastatin (LIPITOR) 10 MG tablet Take 10 mg by mouth daily.    [provider]  benzonatate (TESSALON) 100 MG capsule Take 1 capsule (100 mg total) by mouth every 8 (eight) hours. 08/16/19   Avegno, Zachery Dakins, FNP  buPROPion (WELLBUTRIN XL) 150 MG 24 hr tablet Take 150 mg by mouth daily. 10/13/16   [provider]  cefdinir (OMNICEF) 300 MG capsule Take 1 capsule (300 mg total) by mouth every 12 (twelve) hours. 03/23/23   Catarina Hartshorn, MD  cholecalciferol (VITAMIN D3) 25 MCG (1000 UNIT) tablet Take 1,000 Units by mouth daily.    [provider]  furosemide (LASIX) 20 MG tablet Take 20 mg by mouth every other day. 08/18/16   [provider]  gabapentin (NEURONTIN) 300 MG capsule TAKE 1 CAPSULE BY MOUTH IN THE MORNING, ONE CAPSULE AT NOON AND TWO CAPSULES AT NIGHT Patient taking differently: Take 300 mg by mouth See admin instructions. Take 1 capsule by mouth in the morning, 1 capsule at noon, and 2 capsules at night. 05/24/17   Nilda Riggs, NP  lisinopril (ZESTRIL) 20 MG tablet Take 20  mg by mouth daily.    [provider]  Multiple Vitamin (MULTIVITAMIN WITH MINERALS) TABS Take 1 tablet by mouth daily.    [provider]  naphazoline-glycerin (CLEAR EYES REDNESS) 0.012-0.25 % SOLN Place 2 drops into the left eye 4 (four) times daily as needed for eye irritation. 03/23/23   Catarina Hartshorn, MD  oxyCODONE (OXY IR/ROXICODONE) 5 MG immediate release tablet Take 5 mg by mouth 2 (two) times daily as needed.    [provider]  pramipexole (MIRAPEX) 1 MG tablet TAKE 1 TABLET BY MOUTH TWICE DURING THE DAY AND ONE AND ONE-HALF TABLETS AT NIGHT 10/18/16   Nilda Riggs, NP  PREDNISOLONE ACETATE P-F 1 % ophthalmic suspension Place 1 drop into the right eye 3 (three) times daily.    [provider]  sertraline (ZOLOFT) 100 MG tablet Take 200 mg by mouth daily.    [provider]  tamsulosin (FLOMAX) 0.4 MG CAPS capsule Take 0.4 mg by mouth daily.    [provider]  zinc gluconate 50 MG tablet Take 50 mg by mouth daily.    [provider]     Critical care time: 28    The patient is critically ill with multiple organ system failure and requires high complexity decision making for assessment and support, frequent evaluation and titration of therapies, advanced monitoring, review of radiographic studies and interpretation of complex data.   Critical Care Time devoted to patient care services, exclusive of separately billable procedures, described in this note is 50 minutes.   Rozann Lesches MD  Pulmonary & Critical care See Amion for pager  If no response to pager , please call [phone] until 7pm After 7:00 pm call Elink  [phone] 10/18/2023, 8:09 PM    Case discuss with family-including wife at [facility] waiting room.  They were updated on events.

## 2023-10-18 NOTE — ED Provider Notes (Signed)
 [place] [facility] AT [facility] Provider Note   CSN: [id number] Arrival date & time: 10/18/23  1139     History  Chief Complaint  Patient presents with   Fall   Seizures    Garrett Ferguson is a 78 y.o. male.  Patient has a history of bladder cancer and lymphedema.  He has been very weak for the last 3 days and fell today.  Questionable seizure activity today.  Patient was found to be severely hypothermic in the emergency department  The history is provided by a relative, medical records and the EMS personnel. No language interpreter was used.  Weakness Severity:  Moderate Onset quality:  Sudden Timing:  Constant Progression:  Worsening Chronicity:  New Context: not alcohol use   Relieved by:  Nothing Worsened by:  Nothing Ineffective treatments:  None tried Associated symptoms: no abdominal pain   Risk factors: no anemia        Home Medications Prior to Admission medications   Medication Sig Start Date End Date Taking? Authorizing Provider  metoprolol succinate (TOPROL-XL) 50 MG 24 hr tablet Take 50 mg by mouth daily. 09/19/23  Yes [provider]  oxyCODONE ER 9 MG C12A Take by mouth. 09/19/23  Yes [provider]  aspirin EC 81 MG tablet Take 81 mg by mouth daily.    [provider]  atorvastatin (LIPITOR) 10 MG tablet Take 10 mg by mouth daily.    [provider]  benzonatate (TESSALON) 100 MG capsule Take 1 capsule (100 mg total) by mouth every 8 (eight) hours. 08/16/19   Avegno, Zachery Dakins, FNP  buPROPion (WELLBUTRIN XL) 150 MG 24 hr tablet Take 150 mg by mouth daily. 10/13/16   [provider]  cefdinir (OMNICEF) 300 MG capsule Take 1 capsule (300 mg total) by mouth every 12 (twelve) hours. 03/23/23   Catarina Hartshorn, MD  cholecalciferol (VITAMIN D3) 25 MCG (1000 UNIT) tablet Take 1,000 Units by mouth daily.    [provider]  furosemide (LASIX) 20 MG tablet Take 20 mg by mouth every other day.  08/18/16   [provider]  gabapentin (NEURONTIN) 300 MG capsule TAKE 1 CAPSULE BY MOUTH IN THE MORNING, ONE CAPSULE AT NOON AND TWO CAPSULES AT NIGHT Patient taking differently: Take 300 mg by mouth See admin instructions. Take 1 capsule by mouth in the morning, 1 capsule at noon, and 2 capsules at night. 05/24/17   Nilda Riggs, NP  lisinopril (ZESTRIL) 20 MG tablet Take 20 mg by mouth daily.    [provider]  Multiple Vitamin (MULTIVITAMIN WITH MINERALS) TABS Take 1 tablet by mouth daily.    [provider]  naphazoline-glycerin (CLEAR EYES REDNESS) 0.012-0.25 % SOLN Place 2 drops into the left eye 4 (four) times daily as needed for eye irritation. 03/23/23   Catarina Hartshorn, MD  oxyCODONE (OXY IR/ROXICODONE) 5 MG immediate release tablet Take 5 mg by mouth 2 (two) times daily as needed.    [provider]  pramipexole (MIRAPEX) 1 MG tablet TAKE 1 TABLET BY MOUTH TWICE DURING THE DAY AND ONE AND ONE-HALF TABLETS AT NIGHT 10/18/16   Nilda Riggs, NP  PREDNISOLONE ACETATE P-F 1 % ophthalmic suspension Place 1 drop into the right eye 3 (three) times daily.    [provider]  sertraline (ZOLOFT) 100 MG tablet Take 200 mg by mouth daily.    [provider]  tamsulosin (FLOMAX) 0.4 MG CAPS capsule Take 0.4 mg by  mouth daily.    [provider]  zinc gluconate 50 MG tablet Take 50 mg by mouth daily.    [provider]      Allergies    Sulfamethoxazole-trimethoprim, Diclofenac sodium, Latex, Levofloxacin, and Penicillins    Review of Systems   Review of Systems  Unable to perform ROS: Mental status change  Gastrointestinal:  Negative for abdominal pain.  Neurological:  Positive for weakness.    Physical Exam Updated Vital Signs BP (!) 78/50   Pulse 67   Temp (!) 90.4 F (32.4 C)   Resp 18   SpO2 99%  Physical Exam Vitals and nursing note reviewed.  Constitutional:      Appearance: He is well-developed.  He is ill-appearing.  HENT:     Head: Normocephalic.     Nose: Nose normal.     Mouth/Throat:     Mouth: Mucous membranes are moist.  Eyes:     General: No scleral icterus.    Conjunctiva/sclera: Conjunctivae normal.  Neck:     Thyroid: No thyromegaly.  Cardiovascular:     Rate and Rhythm: Regular rhythm. Tachycardia present.     Heart sounds: No murmur heard.    No friction rub. No gallop.  Pulmonary:     Breath sounds: No stridor. No wheezing or rales.  Chest:     Chest wall: No tenderness.  Abdominal:     General: There is no distension.     Tenderness: There is no abdominal tenderness. There is no rebound.  Musculoskeletal:        General: Normal range of motion.     Cervical back: Neck supple.  Lymphadenopathy:     Cervical: No cervical adenopathy.  Skin:    Findings: No erythema or rash.  Neurological:     Motor: No abnormal muscle tone.     Coordination: Coordination normal.     Comments: Patient awake and able to move all extremities.  He is unable to answer any questions.     ED Results / Procedures / Treatments   Labs (all labs ordered are listed, but only abnormal results are displayed) Labs Reviewed  LACTIC ACID, PLASMA - Abnormal; Notable for the following components:      Result Value   Lactic Acid, Venous 2.0 (*)    All other components within normal limits  COMPREHENSIVE METABOLIC PANEL - Abnormal; Notable for the following components:   Potassium 3.2 (*)    Glucose, Bld 112 (*)    BUN 25 (*)    Creatinine, Ser 0.59 (*)    Calcium 8.5 (*)    Albumin 3.3 (*)    AST 57 (*)    ALT 45 (*)    All other components within normal limits  CBC WITH DIFFERENTIAL/PLATELET - Abnormal; Notable for the following components:   RBC 4.03 (*)    Hemoglobin 11.6 (*)    HCT 37.0 (*)    Platelets 149 (*)    Abs Immature Granulocytes 0.11 (*)    All other components within normal limits  APTT - Abnormal; Notable for the following components:   aPTT 39 (*)     All other components within normal limits  URINALYSIS, W/ REFLEX TO CULTURE (INFECTION SUSPECTED) - Abnormal; Notable for the following components:   APPearance CLOUDY (*)    Hgb urine dipstick LARGE (*)    Protein, ur 100 (*)    Leukocytes,Ua MODERATE (*)    Bacteria, UA FEW (*)    All other components  within normal limits  RESP PANEL BY RT-PCR (RSV, FLU A&B, COVID)  RVPGX2  CULTURE, BLOOD (ROUTINE X 2)  CULTURE, BLOOD (ROUTINE X 2)  EXPECTORATED SPUTUM ASSESSMENT W GRAM STAIN, RFLX TO RESP C  URINE CULTURE  LACTIC ACID, PLASMA  PROTIME-INR  MAGNESIUM  PHOSPHORUS  LACTIC ACID, PLASMA  LACTIC ACID, PLASMA  CBG MONITORING, ED    EKG None  Radiology CT Head Wo Contrast Result Date: 10/18/2023 CLINICAL DATA:  Fall, seizure, neuro deficits/altered mental status. EXAM: CT HEAD WITHOUT CONTRAST TECHNIQUE: Contiguous axial images were obtained from the base of the skull through the vertex without intravenous contrast. RADIATION DOSE REDUCTION: This exam was performed according to the departmental dose-optimization program which includes automated exposure control, adjustment of the mA and/or kV according to patient size and/or use of iterative reconstruction technique. COMPARISON:  CT head 10/15/2022 FINDINGS: Brain: No acute intracranial hemorrhage. No CT evidence of acute infarct. Nonspecific hypoattenuation in the periventricular and subcortical white matter favored to reflect chronic microvascular ischemic changes. No edema, mass effect, or midline shift. The basilar cisterns are patent. Ventricles: Prominence of the ventricles suggesting underlying parenchymal volume loss. Vascular: No hyperdense vessel or unexpected calcification. Skull: No acute or aggressive finding. Orbits: Orbits are symmetric. Sinuses: Postsurgical changes of the right maxillary sinus. Mucosal thickening in the ethmoid sinuses. Other: Mastoid air cells are clear. IMPRESSION: 1. No CT evidence of acute intracranial  abnormality. 2. Chronic microvascular ischemic changes and parenchymal volume loss. Electronically Signed   By: Emily Filbert M.D.   On: 10/18/2023 14:22   DG Chest Port 1 View Result Date: 10/18/2023 CLINICAL DATA:  Possible sepsis. EXAM: PORTABLE CHEST 1 VIEW COMPARISON:  March 21, 2023. FINDINGS: Stable cardiomegaly with mild central pulmonary vascular congestion. Minimal pulmonary edema may be present. Bony thorax is unremarkable. IMPRESSION: Stable cardiomegaly with mild central pulmonary vascular congestion and possible minimal pulmonary edema. Electronically Signed   By: Lupita Raider M.D.   On: 10/18/2023 14:01    Procedures Procedures    Medications Ordered in ED Medications  heparin injection 5,000 Units (has no administration in time range)  sodium chloride flush (NS) 0.9 % injection 3 mL (has no administration in time range)  0.9 %  sodium chloride infusion (has no administration in time range)  sodium chloride flush (NS) 0.9 % injection 3 mL (has no administration in time range)  acetaminophen (TYLENOL) tablet 650 mg (has no administration in time range)    Or  acetaminophen (TYLENOL) suppository 650 mg (has no administration in time range)  oxyCODONE (Oxy IR/ROXICODONE) immediate release tablet 5 mg (has no administration in time range)  HYDROmorphone (DILAUDID) injection 0.5-1 mg (has no administration in time range)  traZODone (DESYREL) tablet 25 mg (has no administration in time range)  senna-docusate (Senokot-S) tablet 1 tablet (has no administration in time range)  bisacodyl (DULCOLAX) EC tablet 5 mg (has no administration in time range)  sodium phosphate (FLEET) enema 1 enema (has no administration in time range)  ondansetron (ZOFRAN) tablet 4 mg (has no administration in time range)    Or  ondansetron (ZOFRAN) injection 4 mg (has no administration in time range)  ipratropium (ATROVENT) nebulizer solution 0.5 mg (has no administration in time range)  levalbuterol  (XOPENEX) nebulizer solution 0.63 mg (has no administration in time range)  hydrALAZINE (APRESOLINE) injection 10 mg (has no administration in time range)  potassium chloride SA (KLOR-CON M) CR tablet 40 mEq (has no administration in time range)  midodrine (PROAMATINE)  tablet 10 mg (has no administration in time range)  albumin human 5 % solution 25 g (has no administration in time range)  lactated ringers infusion (has no administration in time range)  ceFEPIme (MAXIPIME) 2 g in sodium chloride 0.9 % 100 mL IVPB (has no administration in time range)  cefTRIAXone (ROCEPHIN) 2 g in sodium chloride 0.9 % 100 mL IVPB (0 g Intravenous Stopped 10/18/23 1325)  sodium chloride 0.9 % bolus 2,000 mL (0 mLs Intravenous Stopped 10/18/23 1436)  LORazepam (ATIVAN) injection 0.5 mg (0.5 mg Intravenous Given 10/18/23 1224)  sodium chloride 0.9 % bolus 1,000 mL (0 mLs Intravenous Stopped 10/18/23 1436)    ED Course/ Medical Decision Making/ A&P CRITICAL CARE Performed by: Bethann Berkshire Total critical care time: 45 minutes Critical care time was exclusive of separately billable procedures and treating other patients. Critical care was necessary to treat or prevent imminent or life-threatening deterioration. Critical care was time spent personally by me on the following activities: development of treatment plan with patient and/or surrogate as well as nursing, discussions with consultants, evaluation of patient's response to treatment, examination of patient, obtaining history from patient or surrogate, ordering and performing treatments and interventions, ordering and review of laboratory studies, ordering and review of radiographic studies, pulse oximetry and re-evaluation of patient's condition.   Patient is septic and hypothermic.  I spoke with critical care Dr. Denese Killings and since the patient seems to be responding to 3 L of fluids with an appropriate MAP, it was thought the patient could stay at [facility].  Critical care stated that they can be called back to accept the patient,  if the patient gets worse Click here for ABCD2, HEART and other calculatorsREFRESH Note before signing :1}                              Medical Decision Making Amount and/or Complexity of Data Reviewed Labs: ordered. Radiology: ordered. ECG/medicine tests: ordered.  Risk Prescription drug management. Decision regarding hospitalization.  This patient presents to the ED for concern of weakness, this involves an extensive number of treatment options, and is a complaint that carries with it a high risk of complications and morbidity.  The differential diagnosis includes MI, sepsis   Co morbidities that complicate the patient evaluation  Bladder cancer and lymphedema   Additional history obtained:  Additional history obtained from family External records from outside source obtained and reviewed including hospital records   Lab Tests:  I Ordered, and personally interpreted labs.  The pertinent results include: Urine shows greater than 50 reds and greater than 50 white   Imaging Studies ordered:  I ordered imaging studies including CT head I independently visualized and interpreted imaging which showed negative I agree with the radiologist interpretation   Cardiac Monitoring: / EKG:  The patient was maintained on a cardiac monitor.  I personally viewed and interpreted the cardiac monitored which showed an underlying rhythm of: Normal sinus rhythm   Consultations Obtained:  I requested consultation with the bolus,  and discussed lab and imaging findings as well as pertinent plan - they recommend: Admit   Problem List / ED Course / Critical interventions / Medication management  Bladder cancer and lymphedema sepsis and hypothermia I ordered medication including fluids antibiotics for sepsis Reevaluation of the patient after these medicines showed that the patient improved I have reviewed  the patients home medicines and have made adjustments as needed  Social Determinants of Health:  None   Test / Admission - Considered:  None  Sepsis and hypothermia most likely related to urinary tract infection.  Patient will be admitted to the hospitalist service, but if he gets worse the critical care group will be consulted again        Final Clinical Impression(s) / ED Diagnoses Final diagnoses:  None    Rx / DC Orders ED Discharge Orders     None         Bethann Berkshire, MD 10/21/23 1114

## 2023-10-18 NOTE — H&P (Signed)
 History and Physical   Patient: Garrett Ferguson                            PCP: [facility]                    DOB: 30-May-1946            DOA: 10/18/2023 ZOX:[id number]             DOS: 10/18/2023, 3:37 PM  [facility]  Patient coming from:   HOME  I have personally reviewed patient's medical records, in electronic medical records, including:  [place], and care everywhere.    Chief Complaint:   Chief Complaint  Patient presents with   Fall   Seizures    History of present illness:    Garrett Ferguson is a 78 year old male with extensive history of bladder cancer, HTN, restless leg storming, chronic lymphedema, peripheral artery disease, CKD, GERD... Presented from home via EMS with dysuria, altered mental status, hypotensive. Patient is awake, confused, agitated does not provide meaningful history.    ED evaluation/course: Blood pressure (!) 78/50, pulse 67, temperature (!) 90.4 F (32.4 C), resp. rate 18, SpO2 99%. Labs: Sodium 139, potassium 3.2, glucose 112, BUN 25, creatinine 0.59, calcium 8.5, albumin 3.3, low, AST 57, ALT 45, CBC: WBC 4.0, hemoglobin 11.6, Lactic acid 2.0 Respiratory panel, RSV, influenza A, B, SARS-CoV-2 all negative UA: Cloudy, large hemoglobin, moderate leukocyte, positive for protein, > 50 WBC CT head-negative Blood and urine cultures were obtained.  Patient met sepsis criteria, per sepsis protocol, patient was resuscitated with IV fluids, IV antibiotics of Rocephin  EDP discussed the patient with PCCM who stated patient does not meet the criteria to be admitted to [facility] ICU at this point.  ======================================================================= Denies having: Fever, , Cough, SOB, Chest Pain, Abd pain, N/V/D, headache, dizziness, lightheadedness, Joint pain, rash, open wounds  Review of Systems: As per HPI, otherwise 10 point review of systems were negative.     Assessment / Plan:   Principal  Problem:   Severe sepsis (HCC) Active Problems:   UTI (urinary tract infection)   Hypotensive episode   Lymphedema   Hypertension   History of bladder cancer   Hypoalbuminemia   Severe sepsis- Patient meets severe sepsis criteria, with hypothermia, temp of 90.4, Hypotensive, BP of 78/50 with lactic acidosis, and change in mental status  --Admitting patient to ICU setting, with close observation  -Will follow-up with blood and urine cultures -Lactic acid 2.0 -Per sepsis pathway, patient has received 3 L of LR in ED, will continue with IV fluid resuscitation, -Received IV antibiotic Rocephin, will change to cefepime  -Previous history of complicated UTI with E. coli, with a history of bladder cancer -Present on admission-resolved  Hypotensive -Due to severe sepsis -S/p 3 L of bolus fluid of LR, will continue with fluid resuscitation -Replacing albumin -Initiating midodrine 10 mg p.o. 3 times daily  History of hypertension  -Due to hypotension, holding all home BP meds including: Lasix, lisinopril, metoprolol,   UTI-- -will follow-up with urine culture -Previous history of UTI with E. 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 Multiple. Most narrow of which was proximal most bulbar urethra just distal to membranous urethra>>>no worrisome tumor or lesions   Transaminitis     Latest Ref Rng & Units 10/18/2023   12:50 PM 03/21/2023    7:45  PM 04/05/2012   12:52 PM  Hepatic Function  Total Protein 6.5 - 8.1 g/dL 6.8  8.6  8.3   Albumin 3.5 - 5.0 g/dL 3.3  4.3  4.1   AST 15 - 41 U/L 57  16  28   ALT 0 - 44 U/L 45  14  36   Alk Phosphatase 38 - 126 U/L 97  83  87   Total Bilirubin 0.0 - 1.2 mg/dL 0.6  1.6  0.5    -Patient's medication of Lipitor for now Monitoring LFTs closely (likely due to sepsis)  Hyperlipidemia -Holding Lipitor secondary to transaminitis  lymphedema -Patient uses lymphedema pumps at home -follow up [facility] vascular surgery, lymphedema clinic   Restless leg syndrome -Continue pramipexole   Deconditioning -PT -Fall precaution       ----------------------------------------------------------------------------------------------------------------------  Allergies  Allergen Reactions   Sulfamethoxazole-Trimethoprim    Diclofenac Sodium Rash   Latex Rash   Levofloxacin Rash    Pt developed red spots on leg and itching 2-3 hours after taking po Levaquin   Penicillins Rash    Home MEDs:  Prior to Admission medications   Medication Sig Start Date End Date Taking? Authorizing Provider  metoprolol succinate (TOPROL-XL) 50 MG 24 hr tablet Take 50 mg by mouth daily. 09/19/23  Yes [provider]  oxyCODONE ER 9 MG C12A Take by mouth. 09/19/23  Yes [provider]  aspirin EC 81 MG tablet Take 81 mg by mouth daily.    [provider]  atorvastatin (LIPITOR) 10 MG tablet Take 10 mg by mouth daily.    [provider]  benzonatate (TESSALON) 100 MG capsule Take 1 capsule (100 mg total) by mouth every 8 (eight) hours. 08/16/19   Avegno, Zachery Dakins, FNP  buPROPion (WELLBUTRIN XL) 150 MG 24 hr tablet Take 150 mg by mouth daily. 10/13/16   [provider]  cefdinir (OMNICEF) 300 MG capsule Take 1 capsule (300 mg total) by mouth every 12 (twelve) hours. 03/23/23   Catarina Hartshorn, MD  cholecalciferol (VITAMIN D3) 25 MCG (1000 UNIT) tablet Take 1,000 Units by mouth daily.     [provider]  furosemide (LASIX) 20 MG tablet Take 20 mg by mouth every other day. 08/18/16   [provider]  gabapentin (NEURONTIN) 300 MG capsule TAKE 1 CAPSULE BY MOUTH IN THE MORNING, ONE CAPSULE AT NOON AND TWO CAPSULES AT NIGHT Patient taking differently: Take 300 mg by mouth See admin instructions. Take 1 capsule by mouth in the morning, 1 capsule at noon, and 2 capsules at night. 05/24/17   Nilda Riggs, NP  lisinopril (ZESTRIL) 20 MG tablet Take 20 mg by mouth daily.    [provider]  Multiple Vitamin (MULTIVITAMIN WITH MINERALS) TABS Take 1 tablet by mouth daily.    [provider]  naphazoline-glycerin (CLEAR EYES REDNESS) 0.012-0.25 % SOLN Place 2 drops into the left eye 4 (four) times daily as needed for eye irritation. 03/23/23   Catarina Hartshorn, MD  oxyCODONE (OXY IR/ROXICODONE) 5 MG immediate release tablet Take  5 mg by mouth 2 (two) times daily as needed.    [provider]  pramipexole (MIRAPEX) 1 MG tablet TAKE 1 TABLET BY MOUTH TWICE DURING THE DAY AND ONE AND ONE-HALF TABLETS AT NIGHT 10/18/16   Nilda Riggs, NP  PREDNISOLONE ACETATE P-F 1 % ophthalmic suspension Place 1 drop into the right eye 3 (three) times daily.    [provider]  sertraline (ZOLOFT) 100 MG tablet Take 200 mg by mouth daily.    [provider]  tamsulosin (FLOMAX) 0.4 MG CAPS capsule Take 0.4 mg by mouth daily.    [provider]  zinc gluconate 50 MG tablet Take 50 mg by mouth daily.    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   GERD (gastroesophageal reflux disease)    H/O: rheumatic fever    age 30   Hearing loss    History of ulcer disease 04/14/1969   Hypertension    Incontinence of urine    Restless leg syndrome    Restless legs syndrome (RLS) 08/08/2013   RSD (reflex sympathetic dystrophy)    L hand    Past Surgical History:  Procedure Laterality Date   BLADDER SURGERY     transitional cell carcinoma   carpall tunnel     L hand   CATARACT EXTRACTION W/PHACO Right 02/23/2023   Procedure: CATARACT EXTRACTION PHACO AND INTRAOCULAR LENS PLACEMENT (IOC);  Surgeon: Pecolia Ades, MD;  Location: [facility];  Service: Ophthalmology;  Laterality: Right;  CDE 11.94   CHOLECYSTECTOMY  08/14/1977   FOOT SURGERY     x5   JOINT REPLACEMENT Right replacements x3   l knee with multiple surg on same knee   NASAL SINUS SURGERY  04/14/1989   PROSTATE BIOPSY     TOTAL KNEE REVISION  04/23/2012   Procedure: TOTAL KNEE REVISION;  Surgeon: Eulas Post, MD;  Location: [facility];  Service: Orthopedics;  Laterality: Left;   TRANSURETHRAL RESECTION OF PROSTATE       reports that he has never smoked. He has never used smokeless tobacco. He reports that he does not drink alcohol and does not use drugs.   Family History  Problem Relation Age of Onset   Cancer Father    Melanoma Father    Heart disease Sister    Cancer Other    Melanoma Other    High blood pressure Other     Physical Exam:   Vitals:   10/18/23 1303 10/18/23 1315 10/18/23 1435 10/18/23 1515  BP: (!) 72/58 (!) 76/38 (!) 78/50 (!) 78/45  Pulse: 62 61 67 68  Resp: (!) 25 18 18 14   Temp: (!) 88.4 F (31.3 C) (!) 88.7 F (31.5 C) (!) 90.4 F (32.4 C) (!) 91.8 F (33.2 C)  TempSrc:      SpO2: 96% 97% 99% 98%   Constitutional: confused,  comfortable Eyes: PERRL, lids and conjunctivae  normal ENMT: Mucous membranes are moist. Posterior pharynx clear  of any exudate or lesions.Normal dentition.  Neck: normal, supple, no masses, no thyromegaly Respiratory: clear to auscultation bilaterally, no wheezing, no crackles. Normal respiratory effort. No accessory muscle use.  Cardiovascular: Regular rate and rhythm, no murmurs / rubs / gallops. No extremity edema. 2+ pedal pulses. No carotid bruits.  Abdomen: no tenderness, no masses palpated. No hepatosplenomegaly. Bowel sounds positive.  Musculoskeletal: no clubbing / cyanosis. No joint deformity upper and lower extremities. Good ROM, no contractures. Normal muscle tone.  Neurologic: CN II-XII grossly intact. Sensation intact, DTR normal. Strength 5/5 in all 4.  Psychiatric: Agitated, confused Skin: no rashes, lesions, ulcers. No induration     Labs on admission:    I have personally reviewed following labs and imaging studies  CBC: Recent Labs  Lab 10/18/23 1250  WBC 4.0  NEUTROABS 3.0  HGB 11.6*  HCT 37.0*  MCV 91.8  PLT 149*   Basic Metabolic Panel: Recent Labs  Lab 10/18/23 1250  NA 139  K 3.2*  CL 109  CO2 25  GLUCOSE 112*  BUN 25*  CREATININE 0.59*  CALCIUM 8.5*   GFR: CrCl cannot be calculated (Unknown ideal weight.). Liver Function Tests: Recent Labs  Lab 10/18/23 1250  AST 57*  ALT 45*  ALKPHOS 97  BILITOT 0.6  PROT 6.8  ALBUMIN 3.3*   No results for input(s): "LIPASE", "AMYLASE" in the last 168 hours. No results for input(s): "AMMONIA" in the last 168 hours. Coagulation Profile: Recent Labs  Lab 10/18/23 1250  INR 1.1   Cardiac Enzymes: No results for input(s): "CKTOTAL", "CKMB", "CKMBINDEX", "TROPONINI" in the last 168 hours. BNP (last 3 results) No results for input(s): "PROBNP" in the last 8760 hours. HbA1C: No results for input(s): "HGBA1C" in the last 72 hours. CBG: Recent Labs  Lab 10/18/23 1207  GLUCAP 99    Urine analysis:    Component Value Date/Time    COLORURINE YELLOW 10/18/2023 1209   APPEARANCEUR CLOUDY (A) 10/18/2023 1209   APPEARANCEUR Cloudy 04/05/2012 1252   LABSPEC 1.023 10/18/2023 1209   LABSPEC 1.015 04/05/2012 1252   PHURINE 5.0 10/18/2023 1209   GLUCOSEU NEGATIVE 10/18/2023 1209   GLUCOSEU Negative 04/05/2012 1252   HGBUR LARGE (A) 10/18/2023 1209   BILIRUBINUR NEGATIVE 10/18/2023 1209   BILIRUBINUR small (A) 06/21/2022 1903   BILIRUBINUR Negative 04/05/2012 1252   KETONESUR NEGATIVE 10/18/2023 1209   PROTEINUR 100 (A) 10/18/2023 1209   UROBILINOGEN 1.0 06/21/2022 1903   UROBILINOGEN 0.2 04/19/2012 1258   NITRITE NEGATIVE 10/18/2023 1209   LEUKOCYTESUR MODERATE (A) 10/18/2023 1209   LEUKOCYTESUR 2+ 04/05/2012 1252    Last A1C:  No results found for: "HGBA1C"   Radiologic Exams on Admission:   [place] EKG SITE RITE Result Date: 10/18/2023 If Site Rite image not attached, placement could not be confirmed due to current cardiac rhythm.  CT Head Wo Contrast Result Date: 10/18/2023 CLINICAL DATA:  Fall, seizure, neuro deficits/altered mental status. EXAM: CT HEAD WITHOUT CONTRAST TECHNIQUE: Contiguous axial images were obtained from the base of the skull through the vertex without intravenous contrast. RADIATION DOSE REDUCTION: This exam was performed according to the departmental dose-optimization program which includes automated exposure control, adjustment of the mA and/or kV according to patient size and/or use of iterative reconstruction technique. COMPARISON:  CT head 10/15/2022 FINDINGS: Brain: No acute intracranial hemorrhage. No CT evidence of acute infarct. Nonspecific hypoattenuation in the periventricular and subcortical white matter favored to reflect chronic microvascular ischemic changes. No edema, mass effect, or midline shift. The  basilar cisterns are patent. Ventricles: Prominence of the ventricles suggesting underlying parenchymal volume loss. Vascular: No hyperdense vessel or unexpected calcification. Skull: No  acute or aggressive finding. Orbits: Orbits are symmetric. Sinuses: Postsurgical changes of the right maxillary sinus. Mucosal thickening in the ethmoid sinuses. Other: Mastoid air cells are clear. IMPRESSION: 1. No CT evidence of acute intracranial abnormality. 2. Chronic microvascular ischemic changes and parenchymal volume loss. Electronically Signed   By: Emily Filbert M.D.   On: 10/18/2023 14:22   DG Chest Port 1 View Result Date: 10/18/2023 CLINICAL DATA:  Possible sepsis. EXAM: PORTABLE CHEST 1 VIEW COMPARISON:  March 21, 2023. FINDINGS: Stable cardiomegaly with mild central pulmonary vascular congestion. Minimal pulmonary edema may be present. Bony thorax is unremarkable. IMPRESSION: Stable cardiomegaly with mild central pulmonary vascular congestion and possible minimal pulmonary edema. Electronically Signed   By: Lupita Raider M.D.   On: 10/18/2023 14:01    EKG:   Independently reviewed.  Orders placed or performed during the hospital encounter of 10/18/23   EKG 12-Lead   EKG 12-Lead   EKG 12-Lead   EKG 12-Lead   EKG 12-Lead   ---------------------------------------------------------------------------------------------------------------------------------------   Consults called:  None  -------------------------------------------------------------------------------------------------------------------------------------------- DVT prophylaxis:  heparin injection 5,000 Units Start: 10/18/23 2200 TED hose Start: 10/18/23 1442 SCDs Start: 10/18/23 1442   Code Status:   Code Status: Full Code   Admission status: Patient will be admitted as Inpatient, with a greater than 2 midnight length of stay. Level of care: Stepdown   Family Communication: Wife present at bedside  (The above findings and plan of care has been discussed with patient in detail, the patient expressed understanding and agreement of above plan)   --------------------------------------------------------------------------------------------------------------------------------------------------  Disposition Plan: >3 days Status is: Inpatient Remains inpatient appropriate because: Meeting sepsis criteria, admission to ICU, with IV fluid resuscitation, IV antibiotics  --------------------------------------------------------------------------------------------------------------------------------------------  Critical care time spent:  26  Min.  Was spent seeing and evaluating the patient, reviewing all medical records, drawn plan of care.  SIGNED: Kendell Bane, MD, FHM. FAAFP. [place] [facility], Pager  (Please use [email or web address] to page/ or secure chat through epic) If 7PM-7AM, please contact night-coverage [email or web address],  10/18/2023, 3:37 PM

## 2023-10-18 NOTE — ED Triage Notes (Signed)
 Pt arrives from home via [facility] after being called to home for a fall. FD reports to EMS that upon arrival pt sitting up. Pt in process of agreeing to go to hospital with EMS when he started seizing. EMS reports history of DM  and seizures. Seizure lasted about 2 min per EMS, 109 CBG, 18 g L AC, originally placed on NRB, upon arrival to ED pt has nasal trumpet in place. EMS also reports being called to the home for fall last night, was not evaluated. Unknown blood thinner status.

## 2023-10-19 ENCOUNTER — Inpatient Hospital Stay (HOSPITAL_COMMUNITY): Admit: 2023-10-19

## 2023-10-19 ENCOUNTER — Inpatient Hospital Stay (HOSPITAL_COMMUNITY)

## 2023-10-19 DIAGNOSIS — Z7189 Other specified counseling: Secondary | ICD-10-CM

## 2023-10-19 DIAGNOSIS — R531 Weakness: Secondary | ICD-10-CM | POA: Diagnosis not present

## 2023-10-19 DIAGNOSIS — A419 Sepsis, unspecified organism: Secondary | ICD-10-CM | POA: Diagnosis not present

## 2023-10-19 DIAGNOSIS — R569 Unspecified convulsions: Secondary | ICD-10-CM | POA: Diagnosis not present

## 2023-10-19 DIAGNOSIS — R578 Other shock: Secondary | ICD-10-CM

## 2023-10-19 DIAGNOSIS — Z515 Encounter for palliative care: Secondary | ICD-10-CM

## 2023-10-19 DIAGNOSIS — R4182 Altered mental status, unspecified: Secondary | ICD-10-CM | POA: Diagnosis not present

## 2023-10-19 LAB — DIFFERENTIAL
Abs Immature Granulocytes: 0.04 10*3/uL (ref 0.00–0.07)
Basophils Absolute: 0 10*3/uL (ref 0.0–0.1)
Basophils Relative: 0 %
Eosinophils Absolute: 0.1 10*3/uL (ref 0.0–0.5)
Eosinophils Relative: 1 %
Immature Granulocytes: 1 %
Lymphocytes Relative: 11 %
Lymphs Abs: 0.8 10*3/uL (ref 0.7–4.0)
Monocytes Absolute: 0.5 10*3/uL (ref 0.1–1.0)
Monocytes Relative: 6 %
Neutro Abs: 6.2 10*3/uL (ref 1.7–7.7)
Neutrophils Relative %: 81 %

## 2023-10-19 LAB — ECHOCARDIOGRAM COMPLETE
AR max vel: 1.83 cm2
AV Area VTI: 1.83 cm2
AV Area mean vel: 1.78 cm2
AV Mean grad: 4 mmHg
AV Peak grad: 6.8 mmHg
Ao pk vel: 1.3 m/s
Area-P 1/2: 3.99 cm2
Height: 71 in
S' Lateral: 3.8 cm
Weight: 3714.31 [oz_av]

## 2023-10-19 LAB — BASIC METABOLIC PANEL
Anion gap: 9 (ref 5–15)
Anion gap: 8 (ref 5–15)
BUN: 24 mg/dL — ABNORMAL HIGH (ref 8–23)
BUN: 24 mg/dL — ABNORMAL HIGH (ref 8–23)
CO2: 24 mmol/L (ref 22–32)
CO2: 21 mmol/L — ABNORMAL LOW (ref 22–32)
Calcium: 7.6 mg/dL — ABNORMAL LOW (ref 8.9–10.3)
Calcium: 7.5 mg/dL — ABNORMAL LOW (ref 8.9–10.3)
Chloride: 109 mmol/L (ref 98–111)
Chloride: 104 mmol/L (ref 98–111)
Creatinine, Ser: 0.8 mg/dL (ref 0.61–1.24)
Creatinine, Ser: 0.87 mg/dL (ref 0.61–1.24)
GFR, Estimated: >60 mL/min (ref >60)
GFR, Estimated: >60 mL/min (ref >60)
Glucose, Bld: 99 mg/dL (ref 70–99)
Glucose, Bld: 330 mg/dL — ABNORMAL HIGH (ref 70–99)
Potassium: 4 mmol/L (ref 3.5–5.1)
Potassium: 3.5 mmol/L (ref 3.5–5.1)
Sodium: 142 mmol/L (ref 135–145)
Sodium: 133 mmol/L — ABNORMAL LOW (ref 135–145)

## 2023-10-19 LAB — COOXEMETRY PANEL
Carboxyhemoglobin: 2 % — ABNORMAL HIGH (ref 0.5–1.5)
Methemoglobin: <0.7 % (ref 0.0–1.5)
O2 Saturation: 80 %
Total hemoglobin: 10.5 g/dL — ABNORMAL LOW (ref 12.0–16.0)

## 2023-10-19 LAB — APTT: aPTT: 40 s — ABNORMAL HIGH (ref 24–36)

## 2023-10-19 LAB — BLOOD GAS, ARTERIAL
Acid-base deficit: 0.7 mmol/L (ref 0.0–2.0)
Bicarbonate: 24.2 mmol/L (ref 20.0–28.0)
Drawn by: 422461
FIO2: 70 %
MECHVT: 600 mL
O2 Saturation: 92.1 %
PEEP: 5 cmH2O
Patient temperature: 37.9
RATE: 15 {breaths}/min
pCO2 arterial: 42 mmHg (ref 32–48)
pH, Arterial: 7.38 (ref 7.35–7.45)
pO2, Arterial: 63 mmHg — ABNORMAL LOW (ref 83–108)

## 2023-10-19 LAB — PROTIME-INR
INR: 1.3 — ABNORMAL HIGH (ref 0.8–1.2)
Prothrombin Time: 15.9 s — ABNORMAL HIGH (ref 11.4–15.2)

## 2023-10-19 LAB — GLUCOSE, CAPILLARY
Glucose-Capillary: 84 mg/dL (ref 70–99)
Glucose-Capillary: 86 mg/dL (ref 70–99)
Glucose-Capillary: 120 mg/dL — ABNORMAL HIGH (ref 70–99)
Glucose-Capillary: 117 mg/dL — ABNORMAL HIGH (ref 70–99)
Glucose-Capillary: 125 mg/dL — ABNORMAL HIGH (ref 70–99)

## 2023-10-19 LAB — CBC
HCT: 32.6 % — ABNORMAL LOW (ref 39.0–52.0)
Hemoglobin: 10.1 g/dL — ABNORMAL LOW (ref 13.0–17.0)
MCH: 29.7 pg (ref 26.0–34.0)
MCHC: 31 g/dL (ref 30.0–36.0)
MCV: 95.9 fL (ref 80.0–100.0)
Platelets: 157 10*3/uL (ref 150–400)
RBC: 3.4 MIL/uL — ABNORMAL LOW (ref 4.22–5.81)
RDW: 15.4 % (ref 11.5–15.5)
WBC: 7.6 10*3/uL (ref 4.0–10.5)
nRBC: 0 % (ref 0.0–0.2)

## 2023-10-19 LAB — HEPATIC FUNCTION PANEL
ALT: 38 U/L (ref 0–44)
AST: 45 U/L — ABNORMAL HIGH (ref 15–41)
Albumin: 2.7 g/dL — ABNORMAL LOW (ref 3.5–5.0)
Alkaline Phosphatase: 73 U/L (ref 38–126)
Bilirubin, Direct: 0.3 mg/dL — ABNORMAL HIGH (ref 0.0–0.2)
Indirect Bilirubin: 0.6 mg/dL (ref 0.3–0.9)
Total Bilirubin: 0.9 mg/dL (ref 0.0–1.2)
Total Protein: 5.7 g/dL — ABNORMAL LOW (ref 6.5–8.1)

## 2023-10-19 LAB — PHOSPHORUS: Phosphorus: 2.8 mg/dL (ref 2.5–4.6)

## 2023-10-19 LAB — HEMOGLOBIN A1C
Hgb A1c MFr Bld: 5.8 % — ABNORMAL HIGH (ref 4.8–5.6)
Mean Plasma Glucose: 119.76 mg/dL

## 2023-10-19 LAB — TRIGLYCERIDES: Triglycerides: 676 mg/dL — ABNORMAL HIGH (ref <150)

## 2023-10-19 LAB — HEMOGLOBIN AND HEMATOCRIT, BLOOD
HCT: 33.7 % — ABNORMAL LOW (ref 39.0–52.0)
Hemoglobin: 10.5 g/dL — ABNORMAL LOW (ref 13.0–17.0)

## 2023-10-19 LAB — TSH: TSH: 4.714 u[IU]/mL — ABNORMAL HIGH (ref 0.350–4.500)

## 2023-10-19 LAB — STREP PNEUMONIAE URINARY ANTIGEN: Strep Pneumo Urinary Antigen: NEGATIVE

## 2023-10-19 LAB — MRSA NEXT GEN BY PCR, NASAL: MRSA by PCR Next Gen: NOT DETECTED

## 2023-10-19 LAB — MAGNESIUM: Magnesium: 1.7 mg/dL (ref 1.7–2.4)

## 2023-10-19 MED ORDER — POLYETHYLENE GLYCOL 3350 17 G PO PACK
17.0000 g | PACK | Freq: Every day | ORAL | Status: DC
  Administered 2023-10-19 – 2023-10-26 (×7): 17 g
  Filled 2023-10-19 (×8): qty 1

## 2023-10-19 MED ORDER — FENTANYL BOLUS VIA INFUSION
20.0000 ug | INTRAVENOUS | Status: DC | PRN
  Administered 2023-10-19: 20 ug via INTRAVENOUS

## 2023-10-19 MED ORDER — PERFLUTREN LIPID MICROSPHERE
1.0000 mL | INTRAVENOUS | Status: AC | PRN
  Administered 2023-10-19: 3 mL via INTRAVENOUS

## 2023-10-19 MED ORDER — OXYCODONE HCL 5 MG PO TABS
5.0000 mg | ORAL_TABLET | Freq: Three times a day (TID) | ORAL | Status: DC
  Administered 2023-10-19 – 2023-10-26 (×19): 5 mg
  Filled 2023-10-19 (×20): qty 1

## 2023-10-19 MED ORDER — ACETAMINOPHEN 650 MG RE SUPP: 650.0000 mg | Freq: Four times a day (QID) | RECTAL | Status: DC | PRN

## 2023-10-19 MED ORDER — PROSOURCE TF20 ENFIT COMPATIBL EN LIQD
60.0000 mL | Freq: Every day | ENTERAL | Status: DC
  Administered 2023-10-20 – 2023-10-25 (×6): 60 mL
  Filled 2023-10-19 (×6): qty 60

## 2023-10-19 MED ORDER — MAGNESIUM SULFATE 2 GM/50ML IV SOLN
2.0000 g | Freq: Once | INTRAVENOUS | Status: AC
  Administered 2023-10-19: 2 g via INTRAVENOUS
  Filled 2023-10-19: qty 50

## 2023-10-19 MED ORDER — VASOPRESSIN 20 UNITS/100 ML INFUSION FOR SHOCK
0.0000 [IU]/min | INTRAVENOUS | Status: DC
  Administered 2023-10-19 – 2023-10-20 (×3): 0.03 [IU]/min via INTRAVENOUS
  Filled 2023-10-19 (×3): qty 100

## 2023-10-19 MED ORDER — OXYCODONE HCL 5 MG PO TABS
5.0000 mg | ORAL_TABLET | Freq: Four times a day (QID) | ORAL | Status: DC
  Administered 2023-10-19: 5 mg
  Filled 2023-10-19: qty 1

## 2023-10-19 MED ORDER — VITAL 1.5 CAL PO LIQD
1000.0000 mL | ORAL | Status: DC
  Administered 2023-10-19 – 2023-10-25 (×6): 1000 mL
  Filled 2023-10-19 (×9): qty 1000

## 2023-10-19 MED ORDER — ACETAMINOPHEN 325 MG PO TABS
650.0000 mg | ORAL_TABLET | Freq: Four times a day (QID) | ORAL | Status: DC | PRN
  Administered 2023-10-20 – 2023-10-23 (×2): 650 mg
  Filled 2023-10-19 (×2): qty 2

## 2023-10-19 MED ORDER — ADULT MULTIVITAMIN LIQUID CH
15.0000 mL | Freq: Every day | ORAL | Status: DC
  Administered 2023-10-19 – 2023-10-26 (×8): 15 mL
  Filled 2023-10-19 (×8): qty 15

## 2023-10-19 MED ORDER — PANTOPRAZOLE SODIUM 40 MG IV SOLR
40.0000 mg | INTRAVENOUS | Status: DC
  Administered 2023-10-19 – 2023-10-25 (×7): 40 mg via INTRAVENOUS
  Filled 2023-10-19 (×7): qty 10

## 2023-10-19 MED ORDER — ASPIRIN 81 MG PO CHEW
81.0000 mg | CHEWABLE_TABLET | Freq: Every day | ORAL | Status: DC
  Administered 2023-10-19: 81 mg
  Filled 2023-10-19: qty 1

## 2023-10-19 MED ORDER — DOCUSATE SODIUM 50 MG/5ML PO LIQD
100.0000 mg | Freq: Two times a day (BID) | ORAL | Status: DC
  Administered 2023-10-19 – 2023-10-24 (×11): 100 mg
  Filled 2023-10-19 (×12): qty 10

## 2023-10-19 MED ORDER — POTASSIUM CHLORIDE 20 MEQ PO PACK
40.0000 meq | PACK | Freq: Once | ORAL | Status: AC
  Administered 2023-10-19: 40 meq
  Filled 2023-10-19: qty 2

## 2023-10-19 MED ORDER — FUROSEMIDE 10 MG/ML IJ SOLN
40.0000 mg | Freq: Once | INTRAMUSCULAR | Status: AC
  Administered 2023-10-19: 40 mg via INTRAVENOUS
  Filled 2023-10-19: qty 4

## 2023-10-19 MED ORDER — FENTANYL BOLUS VIA INFUSION
25.0000 ug | INTRAVENOUS | Status: DC | PRN
  Administered 2023-10-19 – 2023-10-20 (×3): 50 ug via INTRAVENOUS
  Administered 2023-10-20: 100 ug via INTRAVENOUS
  Administered 2023-10-20 – 2023-10-22 (×8): 50 ug via INTRAVENOUS
  Administered 2023-10-22: 100 ug via INTRAVENOUS
  Administered 2023-10-22: 50 ug via INTRAVENOUS
  Administered 2023-10-23 – 2023-10-24 (×5): 100 ug via INTRAVENOUS
  Administered 2023-10-24: 50 ug via INTRAVENOUS

## 2023-10-19 MED ORDER — FENTANYL 2500MCG IN NS 250ML (10MCG/ML) PREMIX INFUSION
25.0000 ug/h | INTRAVENOUS | Status: DC
  Administered 2023-10-20: 75 ug/h via INTRAVENOUS
  Administered 2023-10-20: 50 ug/h via INTRAVENOUS
  Administered 2023-10-20: 100 ug/h via INTRAVENOUS
  Filled 2023-10-19 (×3): qty 250

## 2023-10-19 MED ORDER — SODIUM CHLORIDE 0.9 % IV SOLN
2.0000 g | INTRAVENOUS | Status: DC
  Administered 2023-10-19 – 2023-10-21 (×3): 2 g via INTRAVENOUS
  Filled 2023-10-19 (×3): qty 20

## 2023-10-19 MED ORDER — DEXMEDETOMIDINE HCL IN NACL 400 MCG/100ML IV SOLN
0.0000 ug/kg/h | INTRAVENOUS | Status: DC
  Administered 2023-10-19: 0.4 ug/kg/h via INTRAVENOUS
  Filled 2023-10-19: qty 100

## 2023-10-19 MED ORDER — METRONIDAZOLE 500 MG/100ML IV SOLN
500.0000 mg | Freq: Two times a day (BID) | INTRAVENOUS | Status: DC
  Administered 2023-10-19 – 2023-10-21 (×6): 500 mg via INTRAVENOUS
  Filled 2023-10-19 (×6): qty 100

## 2023-10-19 NOTE — Consult Note (Signed)
 Consultation Note Date: 10/19/2023   Patient Name: Garrett Ferguson  DOB: 04-10-1946  MRN: [id number]  Age / Sex: 78 y.o., male  PCP: Care, [place] [facility] Referring Physician: Charlott Holler, MD  Reason for Consultation: Establishing goals of care  HPI/Patient Profile: 78 y.o. male admitted on 10/18/2023 78 year old gentleman who [place] with his wife in [place] history of bladder cancer hypertension restless leg syndrome chronic lymphedema peripheral arterial disease chronic kidney disease gastroesophageal reflux disease.  Patient had a fall at home was taken to [facility] and then was transferred to [facility].  Admitted to critical care service.  Had to be intubated because of respiratory arrest. Clinical Assessment and Goals of Care: Patient admitted to critical care service, currently in ICU, currently sedated intubated and mechanically ventilated.  Acute hypoxic respiratory failure from sepsis and altered mental status.  Septic shock with possible urinary source, patient has been initiated on IV fluids, low-dose vasopressors as well as broad-spectrum antibiotics.  EEG has also been done to rule out seizures.  Patient remains critically ill. Chart reviewed, patient seen, discussed with nursing colleagues present at bedside, monitor noted. Call placed and was able to reach patient's wife-see below. Palliative medicine is specialized medical care for people living with serious illness. It focuses on providing relief from the symptoms and stress of a serious illness. The goal is to improve quality of life for both the patient and the family. Goals of care: Broad aims of medical therapy in relation to the patient's values and preferences. Our aim is to provide medical care aimed at enabling patients to achieve the goals that matter most to them, given the circumstances of their particular  medical situation and their constraints.    NEXT OF KIN  Wife, nephew.   SUMMARY OF RECOMMENDATIONS   Full code, continue goals of care conversations with patient's wife.  Placed call and discussed with her.  She is extremely tearful.  She states that she has no support.  She states that she is just shocked by the patient's acute decline.  Discussed with her frankly but compassionately about the serious nature of the patient's illness.  With her permission, gave her brief information about considering DNR and about considering a more comfort-focused approach which could include one-way extubation and allowing for a natural dying process.  At this point, she is extremely emotional and tearful.  Offered time and space for reflection, offered supportive empathic presence.  PMT to follow-up on 3 - 8 - 25. Thank you for the consult.  Code Status/Advance Care Planning: Full code   Symptom Management:     Palliative Prophylaxis:  Delirium Protocol  Additional Recommendations (Limitations, Scope, Preferences): Full Scope Treatment  Psycho-social/Spiritual:  Desire for further Chaplaincy support:yes Additional Recommendations: Caregiving  Support/Resources  Prognosis:  guarded  Discharge Planning: To Be Determined      Primary Diagnoses: Present on Admission:  UTI (urinary tract infection)  Hypertension  Hypotensive episode  Severe sepsis (HCC)  Septic shock (HCC)  Acute  hypoxic respiratory failure (HCC)   I have reviewed the medical record, interviewed the patient and family, and examined the patient. The following aspects are pertinent.  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   GERD (gastroesophageal  reflux disease)    H/O: rheumatic fever    age 24   Hearing loss    History of ulcer disease 04/14/1969   Hypertension    Incontinence of urine    Restless leg syndrome    Restless legs syndrome (RLS) 08/08/2013   RSD (reflex sympathetic dystrophy)    L hand   Social History   Socioeconomic History   Marital status: Married    Spouse name: Kathie Rhodes   Number of children: 1   Years of education: college   Highest education level: Not on file  Occupational History    Employer: [employer]  Tobacco Use   Smoking status: Never   Smokeless tobacco: Never  Vaping Use   Vaping status: Never Used  Substance and Sexual Activity   Alcohol use: No   Drug use: No   Sexual activity: Not on file  Other Topics Concern   Not on file  Social History Narrative   Patient is married and lives with his wife([name]).   Patient has a degree from a divinity school.   Patient is a retired Optician, dispensing.   Patient has one child.   Patient drinks some tea and diet soda occasionally.   Patient is right handed.   Social Drivers of Health   Financial Resource Strain: Medium Risk (04/06/2021)   Received from [facility], [facility]   Overall Financial Resource Strain (CARDIA)    Difficulty of Paying Living Expenses: Somewhat hard  Food Insecurity: No Food Insecurity (03/22/2023)   Hunger Vital Sign    Worried About Running Out of Food in the Last Year: Never true    Ran Out of Food in the Last Year: Never true  Transportation Needs: No Transportation Needs (03/22/2023)   PRAPARE - Administrator, Civil Service (Medical): No    Lack of Transportation (Non-Medical): No  Physical Activity: Insufficiently Active (02/23/2021)   Received from [facility], [facility]   Exercise Vital Sign    Days of Exercise per Week: 5 days    Minutes of Exercise per Session: 10 min  Stress: No Stress Concern Present (02/23/2021)   Received from [facility], [facility] - Occupational Stress Questionnaire    Feeling of Stress : Not at all  Social Connections: Socially Integrated (02/23/2021)   Received from [facility], [facility]   Social Connection and Isolation Panel [NHANES]    Frequency of Communication with Friends and Family: More than three times a week    Frequency of Social Gatherings with Friends and Family: More than three times a week    Attends Religious Services: More than 4 times per year    Active Member of Clubs or Organizations: Yes    Attends Banker Meetings: More than 4 times per year    Marital Status:  Married   Family History  Problem Relation Age of Onset   Cancer Father    Melanoma Father    Heart disease Sister    Cancer Other    Melanoma Other    High blood pressure Other    Scheduled Meds:  Chlorhexidine Gluconate Cloth  6 each Topical Daily   docusate  100 mg Per Tube BID   [START ON 10/20/2023] feeding supplement (PROSource TF20)  60 mL Per Tube Daily   furosemide  40 mg Intravenous Once   heparin  5,000 Units Subcutaneous Q8H   insulin aspart  0-15 Units Subcutaneous Q4H   multivitamin  15 mL Per Tube Daily   mouth rinse  15 mL Mouth Rinse Q2H   oxyCODONE  5 mg Per Tube Q8H   pantoprazole (PROTONIX) IV  40 mg Intravenous Q24H   polyethylene glycol  17 g Per Tube Daily   Continuous Infusions:  sodium chloride Stopped (10/19/23 0659)   cefTRIAXone (ROCEPHIN)  IV Stopped (10/19/23 1206)   dexmedetomidine (PRECEDEX) IV infusion Stopped (10/19/23 1259)   feeding supplement (VITAL 1.5 CAL)     fentaNYL infusion INTRAVENOUS 50 mcg/hr (10/19/23 1356)   metronidazole Stopped (10/19/23 1350)   norepinephrine (LEVOPHED) Adult infusion 13 mcg/min (10/19/23 1356)   vancomycin     vasopressin 0.03 Units/min (10/19/23 1356)   PRN Meds:.acetaminophen **OR** acetaminophen, bisacodyl, fentaNYL, ipratropium, levalbuterol, mouth rinse Medications Prior to Admission:   Prior to Admission medications   Medication Sig Start Date End Date Taking? Authorizing Provider  aspirin EC 81 MG tablet Take 81 mg by mouth daily.    [provider]  atorvastatin (LIPITOR) 10 MG tablet Take 10 mg by mouth daily.    [provider]  benzonatate (TESSALON) 100 MG capsule Take 1 capsule (100 mg total) by mouth every 8 (eight) hours. 08/16/19   Avegno, Zachery Dakins, FNP  buPROPion (WELLBUTRIN XL) 150 MG 24 hr tablet Take 150 mg by mouth daily. 10/13/16   [provider]  cefdinir (OMNICEF) 300 MG capsule Take 1 capsule (300 mg total) by mouth every 12 (twelve) hours. 03/23/23   Catarina Hartshorn, MD  cholecalciferol (VITAMIN D3) 25 MCG (1000 UNIT) tablet Take 1,000 Units by mouth daily.    [provider]  furosemide (LASIX) 20 MG tablet Take 20 mg by mouth every other day. 08/18/16   [provider]  gabapentin (NEURONTIN) 300 MG capsule TAKE 1 CAPSULE BY MOUTH IN THE MORNING, ONE CAPSULE AT NOON AND TWO CAPSULES AT NIGHT Patient taking differently: Take 300 mg by mouth See admin instructions. Take 1 capsule by mouth in the morning, 1 capsule at noon, and 2 capsules at night. 05/24/17   Nilda Riggs, NP  lisinopril (ZESTRIL) 20 MG tablet Take 20 mg by mouth daily.    [provider]  metoprolol succinate (TOPROL-XL) 50 MG 24 hr tablet Take 50 mg by mouth daily. 09/19/23   [provider]  Multiple Vitamin (MULTIVITAMIN WITH MINERALS) TABS Take 1 tablet by mouth daily.    [provider]  naphazoline-glycerin (CLEAR EYES REDNESS) 0.012-0.25 % SOLN Place 2 drops into the left eye 4 (four) times daily as needed for eye irritation. 03/23/23   Catarina Hartshorn, MD  oxyCODONE (OXY IR/ROXICODONE) 5 MG immediate release tablet Take 5 mg by mouth 2 (two) times daily as needed.    [provider]  oxyCODONE ER 9 MG C12A Take by mouth. 09/19/23   [provider]  pramipexole (MIRAPEX) 1 MG  tablet TAKE 1 TABLET BY MOUTH  TWICE DURING THE DAY AND ONE AND ONE-HALF TABLETS AT NIGHT 10/18/16   Nilda Riggs, NP  PREDNISOLONE ACETATE P-F 1 % ophthalmic suspension Place 1 drop into the right eye 3 (three) times daily.    [provider]  sertraline (ZOLOFT) 100 MG tablet Take 200 mg by mouth daily.    [provider]  tamsulosin (FLOMAX) 0.4 MG CAPS capsule Take 0.4 mg by mouth daily.    [provider]  zinc gluconate 50 MG tablet Take 50 mg by mouth daily.    [provider]   Allergies  Allergen Reactions   Sulfamethoxazole-Trimethoprim    Diclofenac Sodium Rash   Latex Rash   Levofloxacin Rash    Pt developed red spots on leg and itching 2-3 hours after taking po Levaquin   Penicillins Rash   Review of Systems On vent  Physical Exam Patient is intubated and mechanically ventilated Monitor noted Has bilateral 2+ lower extremity edema with chronic changes of venous stasis evident as well as discoloration On low-dose vasopressors  Vital Signs: BP (!) 93/39   Pulse 71   Temp 98.2 F (36.8 C) (Oral)   Resp 11   Ht 5\' 11"  (1.803 m)   Wt 105.3 kg   SpO2 99%   BMI 32.38 kg/m  Pain Scale: CPOT       SpO2: SpO2: 99 % O2 Device:SpO2: 99 % O2 Flow Rate: .   IO: Intake/output summary:  Intake/Output Summary (Last 24 hours) at 10/19/2023 1401 Last data filed at 10/19/2023 1356 Gross per 24 hour  Intake 7471.05 ml  Output --  Net 7471.05 ml    LBM: Last BM Date :  (PTA) Baseline Weight: Weight: 103 kg Most recent weight: Weight: 105.3 kg     Palliative Assessment/Data:   PPS 40%  Time In:  1300 Time Out:  1400 Time Total:  60  Greater than 50%  of this time was spent counseling and coordinating care related to the above assessment and plan.  Signed by: Rosalin Hawking, MD   Please contact Palliative Medicine Team phone at [phone] for questions and concerns.  For individual provider: See Loretha Stapler

## 2023-10-19 NOTE — Progress Notes (Signed)
 eLink Physician-Brief Progress Note Patient Name: Garrett Ferguson DOB: 04-03-1946 MRN: [id number]   Date of Service  10/19/2023  HPI/Events of Note  Traumatic foley placement with bloody urine Nurse concerned foley obstructed.  Able to flush.  eICU Interventions  Bladder scanning May need urology consult this am     Intervention Category Intermediate Interventions: OtherHenry Russel, Demetrius Charity 10/19/2023, 6:35 AM

## 2023-10-19 NOTE — Progress Notes (Signed)
 Pt came from [facility] with a latex, temp foley. This unit received a phone call relating that the patient has a latex allergy so it was decided that this nurse would replace the current foley catheter with a latex-free, silicon catheter.   This nurse inserted the new catheter. A small amount of urine was returned, along with some blood.  This nurse observed the contents of the urine bag over the next hour to assess further. Clots were seen so this nurse flushed the cath with a 10cc syringe of NS, which released clots into the bag. This nurse then noticed a small amount of blood draining from the pts penis.  This nurse then attempted to deflate the bulb to assess placement but found only resistance. Nothing would draw back into the syringe.  This nurse reached out to the E-Link physician, Dr. Katrinka Blazing, for guidance. Please see Dr. Michaelle Copas notes

## 2023-10-19 NOTE — Progress Notes (Signed)
 PT Cancellation Note  Patient Details Name: DEVELL PARKERSON MRN: [id number] DOB: Jul 25, 1946   Cancelled Treatment:    Reason Eval/Treat Not Completed: Medical issues which prohibited therapy  PT orders placed yesterday prior to rapid response leading to pt being intubated.  Currently FiO2 setting 70%.  Will hold PT at this time. Anise Salvo, PT Acute Rehab [facility] [phone]  Rayetta Humphrey 10/19/2023, 9:46 AM

## 2023-10-19 NOTE — Consult Note (Addendum)
 WOC Nurse Consult Note: patient with longstanding history of lymphedema and venous ulcers; patient has been followed by wound care center in [place] last seen 02/2023; has lymphedema pumps at home as well  Reason for Consult: L calf wound  Wound type: full thickness r/t venous insufficiency  Pressure Injury POA: NA  Measurement: see nursing flowsheet  Wound bed:  appear largely pink moist  Drainage (amount, consistency, odor) see nursing flowsheet  Periwound: edema and chronic venous stasis changes  Dressing procedure/placement/frequency: Cleanse L lower leg wound with Vashe wound cleanser Hart [place] [phone]), apply Xeroform gauze to wound bed daily, cover with ABD pad and secure with Kerlix roll gauze applied beginning just above toes and ending right below knee.  May apply Ace bandage wrapped in same fashion as Kerlix for light compression.   POC discussed with bedside nurse. WOC team will not follow. Re-consult if further needs arise.   Thank you,    Priscella Mann MSN, RN-BC, [employer] [phone]

## 2023-10-19 NOTE — Progress Notes (Signed)
 Initial Nutrition Assessment  INTERVENTION:   -Initiate Vital 1.5 @ 20 ml/hr via OGT -Advance by 10 ml every 12 hours to goal rate of 60 ml/hr. -60 ml Prosource TF 20 daily -Goal provides 2240 kcals, 117g protein and 1100 ml H2O  NUTRITION DIAGNOSIS:   Inadequate oral intake related to inability to eat as evidenced by NPO status.  GOAL:   Patient will meet greater than or equal to 90% of their needs  MONITOR:   Vent status, TF tolerance  REASON FOR ASSESSMENT:   Consult, Ventilator Enteral/tube feeding initiation and management, Assessment of nutrition requirement/status  ASSESSMENT:   78 y.o. with a past medical history of  hypertension, RLS, chronic lymphedema, PAD, CKD, GERD who presented to [facility] last night after suffering a fall.  Report however was that he had a seizure.  He presented the ED agitated hypotensive as well as in respiratory failure.  Patient in room, sedated, on vent. No family or visitors at bedside. RD consulted for tube feeding initiation and management. OGT in place, tip located in abdomen, possibly duodenal per x-ray reading. Noted pressor support, seems to be starting to decrease now. Will order trickles and advance slowly. Monitor vitals. MAP improving from this morning.   Patient is currently intubated on ventilator support MV: 8.8 L/min Temp (24hrs), Avg:97.7 F (36.5 C), Min:88.7 F (31.5 C), Max:100.2 F (37.9 C) MAP: 79  Admission weight: 227 lbs Current weight: 232 lbs I/Os: +6.9L since admit yesterday  Medications: Colace, Vasopressin, IV Mg sulfate, Fentanyl, Precedex, Miralax, Liquid MVI, Lasix  Labs reviewed  NUTRITION - FOCUSED PHYSICAL EXAM:  Flowsheet Row Most Recent Value  Orbital Region Mild depletion  Upper Arm Region No depletion  Thoracic and Lumbar Region No depletion  Buccal Region Moderate depletion  Temple Region Moderate depletion  Clavicle Bone Region No depletion  Clavicle and Acromion Bone Region  No depletion  Scapular Bone Region No depletion  Dorsal Hand Unable to assess  [mitts]  Patellar Region No depletion  Anterior Thigh Region No depletion  Posterior Calf Region No depletion  Edema (RD Assessment) Severe  [BLEs]  Hair Reviewed  Eyes Unable to assess  Mouth Unable to assess  Skin Reviewed  Nails Unable to assess       Diet Order:   Diet Order             Diet NPO time specified  Diet effective now                   EDUCATION NEEDS:   Not appropriate for education at this time  Skin:  Skin Assessment: Reviewed RN Assessment  Last BM:  PTA  Height:   Ht Readings from Last 1 Encounters:  10/18/23 5\' 11"  (1.803 m)    Weight:   Wt Readings from Last 1 Encounters:  10/19/23 105.3 kg    BMI:  Body mass index is 32.38 kg/m.  Estimated Nutritional Needs:   Kcal:  2100-2300  Protein:  120-130g  Fluid:  2L/day  Tilda Franco, MS, RD, LDN Inpatient Clinical Dietitian Contact via Secure chat

## 2023-10-19 NOTE — Procedures (Signed)
 Procedures  Consent obtained by provider for Aline placement. Two respiratory therapists attempted twice per protocol, unable to obtain thread line due to thick tissue, left radial artery. Bilateral pulses present. Bandage placed, mitten reapplied. Notified provider to attempt with ultrasound.

## 2023-10-19 NOTE — Consult Note (Signed)
 Urology Consult   Physician requesting consult: Dr. Henry Russel, MD  Reason for consult: Traumatic foley  History of Present Illness: Garrett Ferguson is a 78 y.o. with a past medical history of  hypertension, RLS, chronic lymphedema, PAD, CKD, GERD who presented to [facility] last night after suffering a fall.  Report however was that he had a seizure.  He presented the ED agitated hypotensive as well as in respiratory failure.  He was intubated.  While hospitalized, Foley catheter was placed and subsequently he was found to have a known latex allergy and thus this was exchanged for a silicone catheter.  Unfortunately, following this he has significant bleeding.  Urology was consulted for traumatic Foley catheter placement.  Patient is intubated sedated and thus no further HPI can be obtained from patient.  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   GERD (gastroesophageal reflux disease)    H/O: rheumatic fever    age 49   Hearing loss    History of ulcer disease 04/14/1969   Hypertension    Incontinence of urine    Restless leg syndrome    Restless legs syndrome (RLS) 08/08/2013   RSD (reflex sympathetic dystrophy)    L hand    Past Surgical History:  Procedure Laterality Date   BLADDER SURGERY     transitional cell carcinoma   carpall tunnel     L hand   CATARACT EXTRACTION W/PHACO Right 02/23/2023   Procedure: CATARACT EXTRACTION PHACO AND INTRAOCULAR LENS PLACEMENT (IOC);  Surgeon: Pecolia Ades, MD;  Location: [facility];  Service: Ophthalmology;  Laterality: Right;  CDE 11.94   CHOLECYSTECTOMY  08/14/1977   FOOT SURGERY     x5   JOINT REPLACEMENT Right replacements x3   l knee with multiple surg on  same knee   NASAL SINUS SURGERY  04/14/1989   PROSTATE BIOPSY     TOTAL KNEE REVISION  04/23/2012   Procedure: TOTAL KNEE REVISION;  Surgeon: Eulas Post, MD;  Location: [facility];  Service: Orthopedics;  Laterality: Left;   TRANSURETHRAL RESECTION OF PROSTATE      Current Hospital Medications:  Home Meds:  No current facility-administered medications on file prior to encounter.   Current Outpatient Medications on File Prior to Encounter  Medication Sig Dispense Refill   metoprolol succinate (TOPROL-XL) 50 MG 24 hr tablet Take 50 mg by mouth daily.     oxyCODONE ER 9 MG C12A Take by mouth.     aspirin EC 81 MG tablet Take 81 mg by mouth daily.     atorvastatin (LIPITOR) 10 MG tablet Take 10 mg by mouth daily.     benzonatate (TESSALON) 100 MG capsule Take 1 capsule (100 mg total) by mouth every 8 (eight) hours. 21 capsule 0   buPROPion (WELLBUTRIN XL) 150 MG 24 hr tablet Take 150 mg by mouth daily.     cefdinir (OMNICEF) 300 MG capsule Take 1 capsule (300 mg total) by mouth every 12 (twelve) hours. 12 capsule 0   cholecalciferol (VITAMIN D3) 25 MCG (1000 UNIT) tablet Take 1,000 Units by mouth daily.     furosemide (LASIX) 20 MG tablet  Take 20 mg by mouth every other day.     gabapentin (NEURONTIN) 300 MG capsule TAKE 1 CAPSULE BY MOUTH IN THE MORNING, ONE CAPSULE AT NOON AND TWO CAPSULES AT NIGHT (Patient taking differently: Take 300 mg by mouth See admin instructions. Take 1 capsule by mouth in the morning, 1 capsule at noon, and 2 capsules at night.) 360 capsule 0   lisinopril (ZESTRIL) 20 MG tablet Take 20 mg by mouth daily.     Multiple Vitamin (MULTIVITAMIN WITH MINERALS) TABS Take 1 tablet by mouth daily.     naphazoline-glycerin (CLEAR EYES REDNESS) 0.012-0.25 % SOLN Place 2 drops into the left eye 4 (four) times daily as needed for eye irritation.     oxyCODONE (OXY IR/ROXICODONE) 5 MG immediate release tablet Take 5 mg by mouth 2 (two) times daily as needed.     pramipexole  (MIRAPEX) 1 MG tablet TAKE 1 TABLET BY MOUTH TWICE DURING THE DAY AND ONE AND ONE-HALF TABLETS AT NIGHT 315 tablet 1   PREDNISOLONE ACETATE P-F 1 % ophthalmic suspension Place 1 drop into the right eye 3 (three) times daily.     sertraline (ZOLOFT) 100 MG tablet Take 200 mg by mouth daily.     tamsulosin (FLOMAX) 0.4 MG CAPS capsule Take 0.4 mg by mouth daily.     zinc gluconate 50 MG tablet Take 50 mg by mouth daily.       Scheduled Meds:  Chlorhexidine Gluconate Cloth  6 each Topical Daily   docusate  100 mg Per Tube BID   heparin  5,000 Units Subcutaneous Q8H   insulin aspart  0-15 Units Subcutaneous Q4H   multivitamin  15 mL Per Tube Daily   mouth rinse  15 mL Mouth Rinse Q2H   oxyCODONE  5 mg Per Tube Q8H   pantoprazole (PROTONIX) IV  40 mg Intravenous Q24H   polyethylene glycol  17 g Per Tube Daily   Continuous Infusions:  sodium chloride Stopped (10/19/23 0659)   cefTRIAXone (ROCEPHIN)  IV 2 g (10/19/23 1136)   dexmedetomidine (PRECEDEX) IV infusion 0.4 mcg/kg/hr (10/19/23 0834)   fentaNYL infusion INTRAVENOUS 50 mcg/hr (10/19/23 0910)   metronidazole     norepinephrine (LEVOPHED) Adult infusion 17 mcg/min (10/19/23 0834)   vancomycin     vasopressin 0.03 Units/min (10/19/23 0936)   PRN Meds:.acetaminophen **OR** acetaminophen, bisacodyl, fentaNYL, ipratropium, levalbuterol, mouth rinse  Allergies:  Allergies  Allergen Reactions   Sulfamethoxazole-Trimethoprim    Diclofenac Sodium Rash   Latex Rash   Levofloxacin Rash    Pt developed red spots on leg and itching 2-3 hours after taking po Levaquin   Penicillins Rash    Family History  Problem Relation Age of Onset   Cancer Father    Melanoma Father    Heart disease Sister    Cancer Other    Melanoma Other    High blood pressure Other     Social History:  reports that he has never smoked. He has never used smokeless tobacco. He reports that he does not drink alcohol and does not use drugs.  ROS: A complete  review of systems was performed.  All systems are negative except for pertinent findings as noted.  Physical Exam:  Vital signs in last 24 hours: Temp:  [88.4 F (31.3 C)-100.2 F (37.9 C)] 98.2 F (36.8 C) (03/07 0816) Pulse Rate:  [61-87] 71 (03/07 0715) Resp:  [8-35] 11 (03/07 0715) BP: (70-141)/(36-97) 93/39 (03/07 0715) SpO2:  [82 %-100 %] 99 % (03/07  1151) FiO2 (%):  [40 %-100 %] 40 % (03/07 1151) Weight:  [103 kg-105.3 kg] 105.3 kg (03/07 0500) Constitutional: Intubated, sedated cardiovascular: Regular rate and rhythm Respiratory: Intubated GI: Abdomen is soft nondistended GU: Penis in place with blood around meatus and blood within Foley catheter.  Unable to irrigate catheter. Neurologic: Intubated and sedated Psychiatric: Intubated and sedated  Laboratory Data:  Recent Labs    10/18/23 1250 10/19/23 0504  WBC 4.0 7.6  HGB 11.6* 10.1*  HCT 37.0* 32.6*  PLT 149* 157    Recent Labs    10/18/23 1250 10/19/23 0504  NA 139 142  K 3.2* 4.0  CL 109 109  GLUCOSE 112* 99  BUN 25* 24*  CALCIUM 8.5* 7.6*  CREATININE 0.59* 0.80     Results for orders placed or performed during the hospital encounter of 10/18/23 (from the past 24 hours)  Lactic acid, plasma     Status: Abnormal   Collection Time: 10/18/23 12:50 PM  Result Value Ref Range   Lactic Acid, Venous 2.0 (HH) 0.5 - 1.9 mmol/L  Comprehensive metabolic panel     Status: Abnormal   Collection Time: 10/18/23 12:50 PM  Result Value Ref Range   Sodium 139 135 - 145 mmol/L   Potassium 3.2 (L) 3.5 - 5.1 mmol/L   Chloride 109 98 - 111 mmol/L   CO2 25 22 - 32 mmol/L   Glucose, Bld 112 (H) 70 - 99 mg/dL   BUN 25 (H) 8 - 23 mg/dL   Creatinine, Ser 5.40 (L) 0.61 - 1.24 mg/dL   Calcium 8.5 (L) 8.9 - 10.3 mg/dL   Total Protein 6.8 6.5 - 8.1 g/dL   Albumin 3.3 (L) 3.5 - 5.0 g/dL   AST 57 (H) 15 - 41 U/L   ALT 45 (H) 0 - 44 U/L   Alkaline Phosphatase 97 38 - 126 U/L   Total Bilirubin 0.6 0.0 - 1.2 mg/dL   GFR,  Estimated >98 >11 mL/min   Anion gap 5 5 - 15  CBC with Differential     Status: Abnormal   Collection Time: 10/18/23 12:50 PM  Result Value Ref Range   WBC 4.0 4.0 - 10.5 K/uL   RBC 4.03 (L) 4.22 - 5.81 MIL/uL   Hemoglobin 11.6 (L) 13.0 - 17.0 g/dL   HCT 91.4 (L) 78.2 - 95.6 %   MCV 91.8 80.0 - 100.0 fL   MCH 28.8 26.0 - 34.0 pg   MCHC 31.4 30.0 - 36.0 g/dL   RDW 21.3 08.6 - 57.8 %   Platelets 149 (L) 150 - 400 K/uL   nRBC 0.0 0.0 - 0.2 %   Neutrophils Relative % 74 %   Neutro Abs 3.0 1.7 - 7.7 K/uL   Lymphocytes Relative 16 %   Lymphs Abs 0.7 0.7 - 4.0 K/uL   Monocytes Relative 4 %   Monocytes Absolute 0.2 0.1 - 1.0 K/uL   Eosinophils Relative 2 %   Eosinophils Absolute 0.1 0.0 - 0.5 K/uL   Basophils Relative 1 %   Basophils Absolute 0.0 0.0 - 0.1 K/uL   Immature Granulocytes 3 %   Abs Immature Granulocytes 0.11 (H) 0.00 - 0.07 K/uL  Protime-INR     Status: None   Collection Time: 10/18/23 12:50 PM  Result Value Ref Range   Prothrombin Time 14.4 11.4 - 15.2 seconds   INR 1.1 0.8 - 1.2  APTT     Status: Abnormal   Collection Time: 10/18/23 12:50 PM  Result Value  Ref Range   aPTT 39 (H) 24 - 36 seconds  Blood Culture (routine x 2)     Status: None (Preliminary result)   Collection Time: 10/18/23 12:50 PM   Specimen: BLOOD  Result Value Ref Range   Specimen Description BLOOD RFOA    Special Requests      Blood Culture results may not be optimal due to an inadequate volume of blood received in culture bottles BOTTLES DRAWN AEROBIC AND ANAEROBIC   Culture      NO GROWTH < 24 HOURS Performed at [facility], [address] [place], [place] [address]    Report Status PENDING   Blood Culture (routine x 2)     Status: None (Preliminary result)   Collection Time: 10/18/23 12:50 PM   Specimen: BLOOD  Result Value Ref Range   Specimen Description BLOOD BLOOD RIGHT ARM    Special Requests      BOTTLES DRAWN AEROBIC AND ANAEROBIC Blood Culture adequate volume   Culture       NO GROWTH < 24 HOURS Performed at [facility], [address], [place], [place] [address]    Report Status PENDING   Magnesium     Status: None   Collection Time: 10/18/23 12:50 PM  Result Value Ref Range   Magnesium 2.0 1.7 - 2.4 mg/dL  Phosphorus     Status: None   Collection Time: 10/18/23 12:50 PM  Result Value Ref Range   Phosphorus 3.4 2.5 - 4.6 mg/dL  Lactic acid, plasma     Status: None   Collection Time: 10/18/23  2:35 PM  Result Value Ref Range   Lactic Acid, Venous 0.9 0.5 - 1.9 mmol/L  Lactic acid, plasma     Status: None   Collection Time: 10/18/23  3:42 PM  Result Value Ref Range   Lactic Acid, Venous 1.1 0.5 - 1.9 mmol/L  Procalcitonin     Status: None   Collection Time: 10/18/23  3:42 PM  Result Value Ref Range   Procalcitonin <0.10 ng/mL  Glucose, capillary     Status: None   Collection Time: 10/18/23  7:25 PM  Result Value Ref Range   Glucose-Capillary 96 70 - 99 mg/dL  Draw ABG 1 hour after initiation of ventilator     Status: Abnormal   Collection Time: 10/18/23  9:09 PM  Result Value Ref Range   FIO2 100 %   Delivery systems VENTILATOR    Mode PRESSURE REGULATED VOLUME CONTROL    MECHVT 600 mL   RATE 15 resp/min   PEEP 5.0 cm H20   pH, Arterial 7.41 7.35 - 7.45   pCO2 arterial 37 32 - 48 mmHg   pO2, Arterial 137 (H) 83 - 108 mmHg   Bicarbonate 24.2 20.0 - 28.0 mmol/L   Acid-base deficit 0.7 0.0 - 2.0 mmol/L   O2 Saturation 100 %   Patient temperature 35.4    Collection site LEFT RADIAL    Drawn by [id number]    Allens test (pass/fail) PASS PASS  Glucose, capillary     Status: None   Collection Time: 10/18/23 11:39 PM  Result Value Ref Range   Glucose-Capillary 88 70 - 99 mg/dL   Comment 1 Notify RN    Comment 2 Document in Chart   Glucose, capillary     Status: None   Collection Time: 10/19/23  3:54 AM  Result Value Ref Range   Glucose-Capillary 84 70 - 99 mg/dL   Comment 1 Notify RN  Comment 2 Document in Chart   Blood gas, arterial      Status: Abnormal   Collection Time: 10/19/23  4:11 AM  Result Value Ref Range   FIO2 70 %   Delivery systems VENTILATOR    Mode PRESSURE REGULATED VOLUME CONTROL    MECHVT 600 mL   RATE 15 resp/min   PEEP 5.0 cm H20   pH, Arterial 7.38 7.35 - 7.45   pCO2 arterial 42 32 - 48 mmHg   pO2, Arterial 63 (L) 83 - 108 mmHg   Bicarbonate 24.2 20.0 - 28.0 mmol/L   Acid-base deficit 0.7 0.0 - 2.0 mmol/L   O2 Saturation 92.1 %   Patient temperature 37.9    Collection site LEFT RADIAL    Drawn by [id number]    Allens test (pass/fail) PASS PASS  Magnesium     Status: None   Collection Time: 10/19/23  5:04 AM  Result Value Ref Range   Magnesium 1.7 1.7 - 2.4 mg/dL  Protime-INR     Status: Abnormal   Collection Time: 10/19/23  5:04 AM  Result Value Ref Range   Prothrombin Time 15.9 (H) 11.4 - 15.2 seconds   INR 1.3 (H) 0.8 - 1.2  APTT     Status: Abnormal   Collection Time: 10/19/23  5:04 AM  Result Value Ref Range   aPTT 40 (H) 24 - 36 seconds  Basic metabolic panel     Status: Abnormal   Collection Time: 10/19/23  5:04 AM  Result Value Ref Range   Sodium 142 135 - 145 mmol/L   Potassium 4.0 3.5 - 5.1 mmol/L   Chloride 109 98 - 111 mmol/L   CO2 24 22 - 32 mmol/L   Glucose, Bld 99 70 - 99 mg/dL   BUN 24 (H) 8 - 23 mg/dL   Creatinine, Ser 9.14 0.61 - 1.24 mg/dL   Calcium 7.6 (L) 8.9 - 10.3 mg/dL   GFR, Estimated >78 >29 mL/min   Anion gap 9 5 - 15  CBC     Status: Abnormal   Collection Time: 10/19/23  5:04 AM  Result Value Ref Range   WBC 7.6 4.0 - 10.5 K/uL   RBC 3.40 (L) 4.22 - 5.81 MIL/uL   Hemoglobin 10.1 (L) 13.0 - 17.0 g/dL   HCT 56.2 (L) 13.0 - 86.5 %   MCV 95.9 80.0 - 100.0 fL   MCH 29.7 26.0 - 34.0 pg   MCHC 31.0 30.0 - 36.0 g/dL   RDW 78.4 69.6 - 29.5 %   Platelets 157 150 - 400 K/uL   nRBC 0.0 0.0 - 0.2 %  Phosphorus     Status: None   Collection Time: 10/19/23  5:04 AM  Result Value Ref Range   Phosphorus 2.8 2.5 - 4.6 mg/dL  Triglycerides     Status: Abnormal    Collection Time: 10/19/23  5:04 AM  Result Value Ref Range   Triglycerides 676 (H) <150 mg/dL  Differential     Status: None   Collection Time: 10/19/23  5:04 AM  Result Value Ref Range   Neutrophils Relative % 81 %   Neutro Abs 6.2 1.7 - 7.7 K/uL   Lymphocytes Relative 11 %   Lymphs Abs 0.8 0.7 - 4.0 K/uL   Monocytes Relative 6 %   Monocytes Absolute 0.5 0.1 - 1.0 K/uL   Eosinophils Relative 1 %   Eosinophils Absolute 0.1 0.0 - 0.5 K/uL   Basophils Relative 0 %   Basophils Absolute 0.0  0.0 - 0.1 K/uL   Immature Granulocytes 1 %   Abs Immature Granulocytes 0.04 0.00 - 0.07 K/uL  Glucose, capillary     Status: None   Collection Time: 10/19/23  7:56 AM  Result Value Ref Range   Glucose-Capillary 86 70 - 99 mg/dL   Comment 1 Notify RN    Comment 2 Document in Chart   Hemoglobin A1c     Status: Abnormal   Collection Time: 10/19/23  8:39 AM  Result Value Ref Range   Hgb A1c MFr Bld 5.8 (H) 4.8 - 5.6 %   Mean Plasma Glucose 119.76 mg/dL  TSH     Status: Abnormal   Collection Time: 10/19/23  8:39 AM  Result Value Ref Range   TSH 4.714 (H) 0.350 - 4.500 uIU/mL  Hepatic function panel     Status: Abnormal   Collection Time: 10/19/23  8:39 AM  Result Value Ref Range   Total Protein 5.7 (L) 6.5 - 8.1 g/dL   Albumin 2.7 (L) 3.5 - 5.0 g/dL   AST 45 (H) 15 - 41 U/L   ALT 38 0 - 44 U/L   Alkaline Phosphatase 73 38 - 126 U/L   Total Bilirubin 0.9 0.0 - 1.2 mg/dL   Bilirubin, Direct 0.3 (H) 0.0 - 0.2 mg/dL   Indirect Bilirubin 0.6 0.3 - 0.9 mg/dL  Glucose, capillary     Status: Abnormal   Collection Time: 10/19/23 11:17 AM  Result Value Ref Range   Glucose-Capillary 120 (H) 70 - 99 mg/dL   Comment 1 Notify RN    Comment 2 Document in Chart    Recent Results (from the past 240 hours)  Resp panel by RT-PCR (RSV, Flu A&B, Covid) Anterior Nasal Swab     Status: None   Collection Time: 10/18/23 12:12 PM   Specimen: Anterior Nasal Swab  Result Value Ref Range Status   SARS  Coronavirus 2 by RT PCR NEGATIVE NEGATIVE Final    Comment: (NOTE) SARS-CoV-2 target nucleic acids are NOT DETECTED.  The SARS-CoV-2 RNA is generally detectable in upper respiratory specimens during the acute phase of infection. The lowest concentration of SARS-CoV-2 viral copies this assay can detect is 138 copies/mL. A negative result does not preclude SARS-Cov-2 infection and should not be used as the sole basis for treatment or other patient management decisions. A negative result may occur with  improper specimen collection/handling, submission of specimen other than nasopharyngeal swab, presence of viral mutation(s) within the areas targeted by this assay, and inadequate number of viral copies(<138 copies/mL). A negative result must be combined with clinical observations, patient history, and epidemiological information. The expected result is Negative.  Fact Sheet for Patients:  BloggerCourse.com  Fact Sheet for Healthcare Providers:  SeriousBroker.it  This test is no t yet approved or cleared by the [place] FDA and  has been authorized for detection and/or diagnosis of SARS-CoV-2 by FDA under an Emergency Use Authorization (EUA). This EUA will remain  in effect (meaning this test can be used) for the duration of the COVID-19 declaration under Section 564(b)(1) of the Act, 21 U.S.C.section 360bbb-3(b)(1), unless the authorization is terminated  or revoked sooner.       Influenza A by PCR NEGATIVE NEGATIVE Final   Influenza B by PCR NEGATIVE NEGATIVE Final    Comment: (NOTE) The Xpert Xpress SARS-CoV-2/FLU/RSV plus assay is intended as an aid in the diagnosis of influenza from Nasopharyngeal swab specimens and should not be used as a sole basis for  treatment. Nasal washings and aspirates are unacceptable for Xpert Xpress SARS-CoV-2/FLU/RSV testing.  Fact Sheet for  Patients: BloggerCourse.com  Fact Sheet for Healthcare Providers: SeriousBroker.it  This test is not yet approved or cleared by the [place] FDA and has been authorized for detection and/or diagnosis of SARS-CoV-2 by FDA under an Emergency Use Authorization (EUA). This EUA will remain in effect (meaning this test can be used) for the duration of the COVID-19 declaration under Section 564(b)(1) of the Act, 21 U.S.C. section 360bbb-3(b)(1), unless the authorization is terminated or revoked.     Resp Syncytial Virus by PCR NEGATIVE NEGATIVE Final    Comment: (NOTE) Fact Sheet for Patients: BloggerCourse.com  Fact Sheet for Healthcare Providers: SeriousBroker.it  This test is not yet approved or cleared by the [place] FDA and has been authorized for detection and/or diagnosis of SARS-CoV-2 by FDA under an Emergency Use Authorization (EUA). This EUA will remain in effect (meaning this test can be used) for the duration of the COVID-19 declaration under Section 564(b)(1) of the Act, 21 U.S.C. section 360bbb-3(b)(1), unless the authorization is terminated or revoked.  Performed at [facility], [address], [place], [place] [address]   Blood Culture (routine x 2)     Status: None (Preliminary result)   Collection Time: 10/18/23 12:50 PM   Specimen: BLOOD  Result Value Ref Range Status   Specimen Description BLOOD RFOA  Final   Special Requests   Final    Blood Culture results may not be optimal due to an inadequate volume of blood received in culture bottles BOTTLES DRAWN AEROBIC AND ANAEROBIC   Culture   Final    NO GROWTH < 24 HOURS Performed at [facility], [address] [place], [place] [address]    Report Status PENDING  Incomplete  Blood Culture (routine x 2)     Status: None (Preliminary result)   Collection Time: 10/18/23 12:50 PM   Specimen: BLOOD  Result  Value Ref Range Status   Specimen Description BLOOD BLOOD RIGHT ARM  Final   Special Requests   Final    BOTTLES DRAWN AEROBIC AND ANAEROBIC Blood Culture adequate volume   Culture   Final    NO GROWTH < 24 HOURS Performed at [facility], [address], [place], [place] [address]    Report Status PENDING  Incomplete    Renal Function: Recent Labs    10/18/23 1250 10/19/23 0504  CREATININE 0.59* 0.80   Estimated Creatinine Clearance: 94 mL/min (by C-G formula based on SCr of 0.8 mg/dL).  Radiologic Imaging: DG Abd 1 View Result Date: 10/19/2023 CLINICAL DATA:  OG tube placement EXAM: ABDOMEN - 1 VIEW COMPARISON:  Chest x-ray 10/19/2023 and older FINDINGS: Film is rotated to left. The right inferior costophrenic angle is clipped off the edge of the film. Stable ET tube and right-sided PICC. Enteric tube is seen with tip extending caudal to the imaging field and right of the spine. This could be duodenal in location. Enlarged cardiopericardial silhouette. Calcified aorta. Vascular congestion with trace edema. More confluent left retrocardiac opacity seen with a possible left effusion. No pneumothorax. IMPRESSION: Enteric tube in place with tip extending off the caudal edge of the film and into the right hemiabdomen but this could be duodenal in location. Advanced from prior abdominal x-ray. ET tube and right PICC in place. Stable enlarged heart with vascular congestion, possible edema and lung base opacities. Rotated radiograph. Electronically Signed   By: Piedad Climes.D.  On: 10/19/2023 10:53   ECHOCARDIOGRAM COMPLETE Result Date: 10/19/2023    ECHOCARDIOGRAM REPORT   Patient Name:   DEMETRICE COMBES Date of Exam: 10/19/2023 Medical Rec #:  [id number]       Height:       71.0 in Accession #:    [id number]      Weight:       232.1 lb Date of Birth:  12/17/1945       BSA:          2.246 m Patient Age:    77 years        BP:           118/49 mmHg Patient Gender: M               HR:           7 bpm.  Exam Location:  Inpatient Procedure: 2D Echo, Cardiac Doppler, Color Doppler and Intracardiac            Opacification Agent (Both Spectral and Color Flow Doppler were            utilized during procedure). Indications:    sHOCK  History:        Patient has no prior history of Echocardiogram examinations.                 PAD; Risk Factors:Non-Smoker and Hypertension.  Sonographer:    Dondra Prader RVT RCS Referring Phys: [phone] PAULA B SIMPSON IMPRESSIONS  1. Abnormal septal motion. Left ventricular ejection fraction, by estimation, is 60 to 65%. The left ventricle has normal function. The left ventricle has no regional wall motion abnormalities. Left ventricular diastolic parameters were normal.  2. Right ventricular systolic function is moderately reduced. The right ventricular size is moderately enlarged.  3. Left atrial size was severely dilated.  4. The mitral valve is abnormal. Trivial mitral valve regurgitation. No evidence of mitral stenosis.  5. Tricuspid valve regurgitation is mild to moderate.  6. The aortic valve is tricuspid. There is moderate calcification of the aortic valve. There is moderate thickening of the aortic valve. Aortic valve regurgitation is not visualized. Aortic valve sclerosis is present, with no evidence of aortic valve stenosis.  7. The inferior vena cava is dilated in size with <50% respiratory variability, suggesting right atrial pressure of 15 mmHg. FINDINGS  Left Ventricle: Abnormal septal motion. Left ventricular ejection fraction, by estimation, is 60 to 65%. The left ventricle has normal function. The left ventricle has no regional wall motion abnormalities. Definity contrast agent was given IV to delineate the left ventricular endocardial borders. Strain was performed and the global longitudinal strain is indeterminate. The left ventricular internal cavity size was normal in size. There is no left ventricular hypertrophy. Left ventricular diastolic parameters were normal. Right  Ventricle: The right ventricular size is moderately enlarged. No increase in right ventricular wall thickness. Right ventricular systolic function is moderately reduced. Left Atrium: Left atrial size was severely dilated. Right Atrium: Right atrial size was normal in size. Pericardium: There is no evidence of pericardial effusion. Mitral Valve: The mitral valve is abnormal. There is mild thickening of the mitral valve leaflet(s). Trivial mitral valve regurgitation. No evidence of mitral valve stenosis. Tricuspid Valve: The tricuspid valve is normal in structure. Tricuspid valve regurgitation is mild to moderate. No evidence of tricuspid stenosis. Aortic Valve: The aortic valve is tricuspid. There is moderate calcification of the aortic valve. There is moderate thickening of the aortic valve. Aortic valve  regurgitation is not visualized. Aortic valve sclerosis is present, with no evidence of aortic valve stenosis. Aortic valve mean gradient measures 4.0 mmHg. Aortic valve peak gradient measures 6.8 mmHg. Aortic valve area, by VTI measures 1.83 cm. Pulmonic Valve: The pulmonic valve was normal in structure. Pulmonic valve regurgitation is not visualized. No evidence of pulmonic stenosis. Aorta: The aortic root is normal in size and structure. Venous: The inferior vena cava is dilated in size with less than 50% respiratory variability, suggesting right atrial pressure of 15 mmHg. IAS/Shunts: No atrial level shunt detected by color flow Doppler. Additional Comments: 3D was performed not requiring image post processing on an independent workstation and was indeterminate.  LEFT VENTRICLE PLAX 2D LVIDd:         5.20 cm   Diastology LVIDs:         3.80 cm   LV e' medial:    4.35 cm/s LV PW:         1.20 cm   LV E/e' medial:  16.3 LV IVS:        1.10 cm   LV e' lateral:   5.93 cm/s LVOT diam:     1.90 cm   LV E/e' lateral: 12.0 LV SV:         56 LV SV Index:   25 LVOT Area:     2.84 cm  RIGHT VENTRICLE            IVC RV  Basal diam:  4.60 cm    IVC diam: 3.50 cm RV S prime:     8.93 cm/s TAPSE (M-mode): 2.3 cm LEFT ATRIUM             Index        RIGHT ATRIUM           Index LA diam:        5.20 cm 2.31 cm/m   RA Area:     18.30 cm LA Vol (A2C):   66.8 ml 29.74 ml/m  RA Volume:   50.90 ml  22.66 ml/m LA Vol (A4C):   73.7 ml 32.81 ml/m LA Biplane Vol: 70.6 ml 31.43 ml/m  AORTIC VALVE                    PULMONIC VALVE AV Area (Vmax):    1.83 cm     PV Vmax:       0.77 m/s AV Area (Vmean):   1.78 cm     PV Peak grad:  2.4 mmHg AV Area (VTI):     1.83 cm AV Vmax:           130.00 cm/s AV Vmean:          87.500 cm/s AV VTI:            0.304 m AV Peak Grad:      6.8 mmHg AV Mean Grad:      4.0 mmHg LVOT Vmax:         84.00 cm/s LVOT Vmean:        54.800 cm/s LVOT VTI:          0.196 m LVOT/AV VTI ratio: 0.64  AORTA Ao Root diam: 3.30 cm MITRAL VALVE               TRICUSPID VALVE MV Area (PHT): 3.99 cm    TR Peak grad:   39.9 mmHg MV Decel Time: 190 msec    TR Vmax:  316.00 cm/s MV E velocity: 71.10 cm/s MV A velocity: 66.80 cm/s  SHUNTS MV E/A ratio:  1.06        Systemic VTI:  0.20 m                            Systemic Diam: 1.90 cm Charlton Haws MD Electronically signed by Charlton Haws MD Signature Date/Time: 10/19/2023/10:01:33 AM    Final    DG CHEST PORT 1 VIEW Result Date: 10/19/2023 CLINICAL DATA:  78 year old male respiratory failure. EXAM: PORTABLE CHEST 1 VIEW COMPARISON:  Portable chest yesterday. FINDINGS: Portable AP semi upright view at 0418 hours. Endotracheal tube tip at the level the clavicles. Enteric tube courses to the abdomen, tip not included. Stable right PICC line. Similar patient rotation to the left. Cardiomegaly. Left lung base hypo ventilation with obscured left hemidiaphragm. No pneumothorax. Mild veiling right lung base opacity is increased. Elsewhere ventilation is stable. Pulmonary vascularity not significantly changed. Stable visualized osseous structures. IMPRESSION: 1. Stable cardiomegaly  with left greater than right lung base opacity, could be a combination of small pleural effusions, atelectasis, infection. 2. Pulmonary vascularity not significantly changed. Visible line and tube position not significantly changed. Electronically Signed   By: Odessa Fleming M.D.   On: 10/19/2023 07:00   DG Abd 1 View Result Date: 10/19/2023 CLINICAL DATA:  78 year old male enteric tube position. EXAM: ABDOMEN - 1 VIEW COMPARISON:  Portable chest yesterday, 1954 hours. FINDINGS: AP view at 0415 hours. Enteric tube placed into the stomach, side hole at the level of the mid gastric body. Nonobstructed bowel-gas pattern with combined gas and retained stool in the visible abdomen. No acute osseous abnormality identified. IMPRESSION: Satisfactory enteric tube placement into the stomach. Electronically Signed   By: Odessa Fleming M.D.   On: 10/19/2023 06:59   DG CHEST PORT 1 VIEW Result Date: 10/18/2023 CLINICAL DATA:  Intubated EXAM: PORTABLE CHEST 1 VIEW COMPARISON:  10/18/2023, 03/21/2023 FINDINGS: Interval intubation, tip of the endotracheal tube is about 3.6 cm superior to carina. Right upper extremity central venous catheter tip over the cavoatrial region. Cardiomegaly with worsened vascular congestion and diffu increased perihilar interstitial and ground-glass opacity probably edema. No pleural effusion or pneumothorax IMPRESSION: 1. Interval intubation, tip of endotracheal tube is about 3.6 cm superior to carina 2. Cardiomegaly with worsened vascular congestion and bilateral interstitial and ground-glass opacities suspicious for edema Electronically Signed   By: Jasmine Pang M.D.   On: 10/18/2023 22:05   DG Chest Portable 1 View Result Date: 10/18/2023 CLINICAL DATA:  PICC line placement EXAM: PORTABLE CHEST 1 VIEW COMPARISON:  10/18/2023, 03/21/2023 FINDINGS: Cardiomegaly with vascular congestion and possible mild edema versus bronchitic change. Right upper extremity central venous catheter tip difficult to visualize  secondary to patient positioning and superimposition artifact, suspect tip is in the region of the cavoatrial junction. IMPRESSION: 1. Right upper extremity central venous catheter tip difficult to see for reasons above, suspect tip may be in the region of cavoatrial junction 2. Cardiomegaly with vascular congestion. Diffuse interstitial opacity which may be due to mild edema or interstitial inflammatory process Electronically Signed   By: Jasmine Pang M.D.   On: 10/18/2023 18:44   [place] EKG SITE RITE Result Date: 10/18/2023 If Site Rite image not attached, placement could not be confirmed due to current cardiac rhythm.  CT Head Wo Contrast Result Date: 10/18/2023 CLINICAL DATA:  Fall, seizure, neuro deficits/altered mental status. EXAM: CT HEAD WITHOUT  CONTRAST TECHNIQUE: Contiguous axial images were obtained from the base of the skull through the vertex without intravenous contrast. RADIATION DOSE REDUCTION: This exam was performed according to the departmental dose-optimization program which includes automated exposure control, adjustment of the mA and/or kV according to patient size and/or use of iterative reconstruction technique. COMPARISON:  CT head 10/15/2022 FINDINGS: Brain: No acute intracranial hemorrhage. No CT evidence of acute infarct. Nonspecific hypoattenuation in the periventricular and subcortical white matter favored to reflect chronic microvascular ischemic changes. No edema, mass effect, or midline shift. The basilar cisterns are patent. Ventricles: Prominence of the ventricles suggesting underlying parenchymal volume loss. Vascular: No hyperdense vessel or unexpected calcification. Skull: No acute or aggressive finding. Orbits: Orbits are symmetric. Sinuses: Postsurgical changes of the right maxillary sinus. Mucosal thickening in the ethmoid sinuses. Other: Mastoid air cells are clear. IMPRESSION: 1. No CT evidence of acute intracranial abnormality. 2. Chronic microvascular ischemic changes  and parenchymal volume loss. Electronically Signed   By: Emily Filbert M.D.   On: 10/18/2023 14:22   DG Chest Port 1 View Result Date: 10/18/2023 CLINICAL DATA:  Possible sepsis. EXAM: PORTABLE CHEST 1 VIEW COMPARISON:  March 21, 2023. FINDINGS: Stable cardiomegaly with mild central pulmonary vascular congestion. Minimal pulmonary edema may be present. Bony thorax is unremarkable. IMPRESSION: Stable cardiomegaly with mild central pulmonary vascular congestion and possible minimal pulmonary edema. Electronically Signed   By: Lupita Raider M.D.   On: 10/18/2023 14:01    I independently reviewed the above imaging studies.  Procedure note: Indwelling Foley catheter was removed.  Penis was prepped and draped.  I advanced a flexible 17 French cystoscope into the urethra and into the bulbar urethra, there is significant bulbar urethra and prostatic trauma.  There was clots throughout the urethra and with great difficulty, was ultimately able to find the true lumen anteriorly and advanced the scope into the prostatic urethra and finally into the bladder.  The bladder appeared to be distended.  Limited visualization revealed no evidence of underlying bladder mass.  There were no clots in the bladder.  Next, a 0.038 sensor wire was advanced into the bladder and curl.  Over this, I placed an 70 French silicone catheter after making this a council tip using a 14-gauge needle, there was return of clear yellow urine.  Foley catheter was left to gravity drainage.  Impression/Recommendation Traumatic Foley/urethral injury: See procedure note as above.  I was able to place an 18 French silicone catheter over wire into the bladder.  Recommend leaving catheter in place for least 7 days or longer pending his overall recovery given the significant trauma sustained with prior Foley catheter attempt. -Following peripherally.  Please call with questions.  Matt R. Kissie Ziolkowski MD 10/19/2023, 12:13 PM  [facility]  Pager:  [phone]

## 2023-10-19 NOTE — Progress Notes (Signed)
 eLink Physician-Brief Progress Note Patient Name: Garrett Ferguson DOB: 1946-02-15 MRN: [id number]   Date of Service  10/19/2023  HPI/Events of Note  Triglyceride 676  eICU Interventions  Stop propofol Start precedex     Intervention Category Intermediate Interventions: Other:  Henry Russel, P 10/19/2023, 6:22 AM

## 2023-10-19 NOTE — Procedures (Signed)
 Patient Name: Garrett Ferguson  MRN: [id number]  Epilepsy Attending: Charlsie Quest  Referring Physician/Provider: Dr Rozann Lesches Date: 10/18/2023 Duration: 20.47 mins  Patient history: 78yo M with ams. EEG to evaluate for seizure  Level of alertness:  comatose/ lethargic   AEDs during EEG study: None  Technical aspects: This EEG was obtained using a 10 lead EEG system positioned circumferentially without any parasagittal coverage (rapid EEG). Computer selected EEG is reviewed as  well as background features and all clinically significant events.  Description: EEG showed continuous generalized 3 to 6 Hz theta-delta slowing. Hyperventilation and photic stimulation were not performed.     ABNORMALITY - Continuous slow, generalized  IMPRESSION: This limited ceribell EEG is suggestive of moderate diffuse encephalopathy. No seizures or epileptiform discharges were seen throughout the recording.  Arvon Schreiner Annabelle Harman

## 2023-10-19 NOTE — Progress Notes (Signed)
*  PRELIMINARY RESULTS* Echocardiogram 2D Echocardiogram has been performed.  Laddie Aquas 10/19/2023, 9:48 AM

## 2023-10-19 NOTE — Progress Notes (Signed)
 Mb called and spoke to nurse Morrie Sheldon to verify EEG and patient availability at [facility]. Nurse spoke with Dr.Desai who states EEG is no longer needed. Pt has had a Rapid EEG that has been read and interpreted by Neurology per Dr. I asked Nurse to place a cancellation order.

## 2023-10-19 NOTE — Progress Notes (Signed)
 OT Cancellation Note  Patient Details Name: Garrett Ferguson MRN: [id number] DOB: 03-07-1946   Cancelled Treatment:    Reason Eval/Treat Not Completed: Medical issues which prohibited therapy;Patient not medically ready. Nursing hold: Pt now  intubated; currently vent FiO2 70%. Will continue to monitor in coming days.    Theodoro Clock 10/19/2023, 10:03 AM

## 2023-10-20 DIAGNOSIS — G9340 Encephalopathy, unspecified: Secondary | ICD-10-CM

## 2023-10-20 DIAGNOSIS — R6520 Severe sepsis without septic shock: Secondary | ICD-10-CM | POA: Diagnosis not present

## 2023-10-20 DIAGNOSIS — J9601 Acute respiratory failure with hypoxia: Secondary | ICD-10-CM | POA: Diagnosis not present

## 2023-10-20 DIAGNOSIS — A419 Sepsis, unspecified organism: Secondary | ICD-10-CM | POA: Diagnosis not present

## 2023-10-20 DIAGNOSIS — R6521 Severe sepsis with septic shock: Secondary | ICD-10-CM | POA: Diagnosis not present

## 2023-10-20 LAB — CBC
HCT: 32.8 % — ABNORMAL LOW (ref 39.0–52.0)
Hemoglobin: 10.2 g/dL — ABNORMAL LOW (ref 13.0–17.0)
MCH: 29.4 pg (ref 26.0–34.0)
MCHC: 31.1 g/dL (ref 30.0–36.0)
MCV: 94.5 fL (ref 80.0–100.0)
Platelets: 105 10*3/uL — ABNORMAL LOW (ref 150–400)
RBC: 3.47 MIL/uL — ABNORMAL LOW (ref 4.22–5.81)
RDW: 15.5 % (ref 11.5–15.5)
WBC: 6.7 10*3/uL (ref 4.0–10.5)
nRBC: 0 % (ref 0.0–0.2)

## 2023-10-20 LAB — BASIC METABOLIC PANEL
Anion gap: 8 (ref 5–15)
BUN: 20 mg/dL (ref 8–23)
CO2: 24 mmol/L (ref 22–32)
Calcium: 7.8 mg/dL — ABNORMAL LOW (ref 8.9–10.3)
Chloride: 102 mmol/L (ref 98–111)
Creatinine, Ser: 0.89 mg/dL (ref 0.61–1.24)
GFR, Estimated: >60 mL/min (ref >60)
Glucose, Bld: 190 mg/dL — ABNORMAL HIGH (ref 70–99)
Potassium: 3.9 mmol/L (ref 3.5–5.1)
Sodium: 134 mmol/L — ABNORMAL LOW (ref 135–145)

## 2023-10-20 LAB — MAGNESIUM: Magnesium: 1.8 mg/dL (ref 1.7–2.4)

## 2023-10-20 LAB — URINE CULTURE
Culture: NO GROWTH
Culture: NO GROWTH

## 2023-10-20 LAB — GLUCOSE, CAPILLARY
Glucose-Capillary: 108 mg/dL — ABNORMAL HIGH (ref 70–99)
Glucose-Capillary: 109 mg/dL — ABNORMAL HIGH (ref 70–99)
Glucose-Capillary: 109 mg/dL — ABNORMAL HIGH (ref 70–99)
Glucose-Capillary: 112 mg/dL — ABNORMAL HIGH (ref 70–99)
Glucose-Capillary: 127 mg/dL — ABNORMAL HIGH (ref 70–99)
Glucose-Capillary: 65 mg/dL — ABNORMAL LOW (ref 70–99)
Glucose-Capillary: 95 mg/dL (ref 70–99)

## 2023-10-20 LAB — PHOSPHORUS: Phosphorus: 2.9 mg/dL (ref 2.5–4.6)

## 2023-10-20 MED ORDER — DEXTROSE 50 % IV SOLN
INTRAVENOUS | Status: AC
  Administered 2023-10-20: 12.5 g via INTRAVENOUS
  Filled 2023-10-20: qty 50

## 2023-10-20 MED ORDER — MAGNESIUM SULFATE 2 GM/50ML IV SOLN
2.0000 g | Freq: Once | INTRAVENOUS | Status: AC
  Administered 2023-10-20: 2 g via INTRAVENOUS
  Filled 2023-10-20: qty 50

## 2023-10-20 MED ORDER — DEXTROSE 50 % IV SOLN: 12.5000 g | Freq: Once | INTRAVENOUS | Status: AC

## 2023-10-20 MED ORDER — INSULIN ASPART 100 UNIT/ML IJ SOLN
0.0000 [IU] | INTRAMUSCULAR | Status: DC
  Administered 2023-10-22 – 2023-10-23 (×2): 1 [IU] via SUBCUTANEOUS
  Administered 2023-10-23: 2 [IU] via SUBCUTANEOUS
  Administered 2023-10-23 (×2): 1 [IU] via SUBCUTANEOUS
  Administered 2023-10-23 – 2023-10-24 (×3): 2 [IU] via SUBCUTANEOUS
  Administered 2023-10-24 – 2023-10-26 (×7): 1 [IU] via SUBCUTANEOUS

## 2023-10-20 MED ORDER — VANCOMYCIN HCL 1500 MG/300ML IV SOLN
1500.0000 mg | INTRAVENOUS | Status: DC
  Administered 2023-10-20 – 2023-10-21 (×2): 1500 mg via INTRAVENOUS
  Filled 2023-10-20 (×2): qty 300

## 2023-10-20 NOTE — Progress Notes (Signed)
 PMT brief progress note.   Patient seen, still with vent dependent resp failure, discussed with nursing colleagues.  Chart reviewed. Labs noted, medication history noted.  Wife has asked for some time and space for reflection, will continue time-limited trial of current interventions for this weekend, palliative to follow up on 10-21-23.  BP (!) 126/45 (BP Location: Left Arm)   Pulse 90   Temp 99.7 F (37.6 C) (Axillary)   Resp 19   Ht 5\' 11"  (1.803 m)   Wt 102.3 kg   SpO2 96%   BMI 31.46 kg/m  Resting in bed, on vent, monitor noted, has LE edema.sedated, intubated, on pressors.  78 year old gentleman who [place] of bladder cancer hypertension restless leg syndrome chronic lymphedema peripheral arterial disease chronic kidney disease gastroesophageal reflux disease. Patient had a fall at home was taken to [facility] and then was transferred to [facility]. Admitted to critical care service. Had to be intubated because of respiratory arrest.  Continue current mode of care, PMT to re address GOC with wife on 10-21-23.  Low MDM Rosalin Hawking MD [place] palliative.

## 2023-10-20 NOTE — Progress Notes (Signed)
 Hypoglycemic Event  Patient's blood sugar at noon was 65.  The patient is intubated and is on tube feedings at 30 ml/hr. Gave 12.5 gm of D50 IV per hypoglycemic protocol.  15 min later blood sugar was 109.  MD notified and the following new orders were given: Short acting insulin before meals and at bedtime changed from 0-15 unit scale to 0-9 unit scale.  Increase tube feed by 10 now instead of at 1700 per the order.  Odis Luster, RN

## 2023-10-20 NOTE — Progress Notes (Signed)
 Pharmacy Antibiotic Note  Garrett Ferguson is a 78 y.o. male admitted on 10/18/2023 with sepsis - possible skin, UTI, or aspiration pneumonia source.  Pharmacy has been consulted for vancomycin dosing.  Plan: Vancomycin decrease to 1500 mg IV q24h  (SCr 0.89, Vd 0.5, est AUC 452) Measure Vanc levels as needed.  Goal AUC = 400 - 550 Follow up renal function, culture results, and clinical course.   Height: 5\' 11"  (180.3 cm) Weight: 102.3 kg (225 lb 8.5 oz) IBW/kg (Calculated) : 75.3  Temp (24hrs), Avg:97.8 F (36.6 C), Min:96.5 F (35.8 C), Max:98.9 F (37.2 C)  Recent Labs  Lab 10/18/23 1250 10/18/23 1435 10/18/23 1542 10/19/23 0504 10/19/23 1456 10/20/23 0435  WBC 4.0  --   --  7.6  --  6.7  CREATININE 0.59*  --   --  0.80 0.87 0.89  LATICACIDVEN 2.0* 0.9 1.1  --   --   --     Estimated Creatinine Clearance: 83.3 mL/min (by C-G formula based on SCr of 0.89 mg/dL).    Allergies  Allergen Reactions   Sulfamethoxazole-Trimethoprim    Diclofenac Sodium Rash   Latex Rash   Levofloxacin Rash    Pt developed red spots on leg and itching 2-3 hours after taking po Levaquin   Penicillins Rash    Antimicrobials this admission:  3/6 Ceftriaxone x1 3/6 Cefepime >> 3/6 3/6 Vancomycin >>  3/7 Ceftriaxone >> 3/7 metronidazole >>   Microbiology results:  3/6 Resp: Neg covid, flu, rsv 3/6 BCx (collected 12:50): ngtd 3/6 BCx (collected 21:01): ngtd 3/7 UCx (foley):  3/7 TA:  gram stain w/ rare GNR, cxt ip  3/6 MRSA PCR: not detected  Thank you for allowing pharmacy to be a part of this patient's care.  Lynann Beaver PharmD, BCPS [facility] main pharmacy [phone] 10/20/2023 9:18 AM

## 2023-10-20 NOTE — Progress Notes (Signed)
 [facility] ADULT ICU REPLACEMENT PROTOCOL   The patient does apply for the [facility] Adult ICU Electrolyte Replacment Protocol based on the criteria listed below:   1.Exclusion criteria: TCTS, ECMO, Dialysis, and Myasthenia Gravis patients 2. Is GFR >/= 30 ml/min? Yes.    Patient's GFR today is >60 3. Is SCr </= 2? Yes.   Patient's SCr is 0.89 mg/dL 4. Did SCr increase >/= 0.5 in 24 hours? No. 5.Pt's weight >40kg  Yes.   6. Abnormal electrolyte(s): Mag 1.8  7. Electrolytes replaced per protocol 8.  Call MD STAT for K+ </= 2.5, Phos </= 1, or Mag </= 1 Physician:  Michelene Gardener [facility] [place] 10/20/2023 5:38 AM

## 2023-10-20 NOTE — Plan of Care (Signed)
  Problem: Fluid Volume: Goal: Hemodynamic stability will improve Outcome: Progressing   Problem: Respiratory: Goal: Ability to maintain adequate ventilation will improve Outcome: Progressing   Problem: Clinical Measurements: Goal: Ability to maintain clinical measurements within normal limits will improve Outcome: Progressing   Problem: Nutrition: Goal: Adequate nutrition will be maintained Outcome: Progressing   Problem: Skin Integrity: Goal: Risk for impaired skin integrity will decrease Outcome: Progressing

## 2023-10-20 NOTE — Plan of Care (Signed)
  Problem: Respiratory: Goal: Ability to maintain adequate ventilation will improve Outcome: Progressing Note: Went down on FiO2 from 35% to 30% today with good O2 sats maintained.   Problem: Clinical Measurements: Goal: Respiratory complications will improve Outcome: Progressing   Problem: Metabolic: Goal: Ability to maintain appropriate glucose levels will improve Outcome: Progressing

## 2023-10-20 NOTE — Progress Notes (Signed)
 NAME:  Garrett Ferguson, MRN:  [id number], DOB:  1946-07-23, LOS: 2 ADMISSION DATE:  10/18/2023, CONSULTATION DATE:  10/18/23 REFERRING MD:  Dr. Flossie Dibble, CHIEF COMPLAINT:  AMS   Brief HPI:  78 y/o male with PMH for Bladder cancer, HTN, RLS, Chronic Lymphedema LE, PAD, CKD, GERD who had a fall at home (had fall night before as well but EMS checked his vitals and did not find him to a candidate for hospital), BIBA to [facility].  Verbal at home but then had a seizure.  Seizure lasting about 2 min per EMS, BG 109.  EMS placed in on 100% NRM and in the ED he had a nasal trumpet placed.  He was confused and agitated.  He was hypothermic 32.4 and Hypotensive 78/50 LA 2.0.  RVR, Influenza A and Covid negative.  UA positive for cloudy and increased WBC.  Sepsis protocol started.  He was transferred to [facility] ICU.  He presented unresponsive no response to pain/sternal rubs.  Presented with Albumin and Levophed running.  He did not have a aga either.  Code called for respiratory arrest and Dr Jarold Motto intubated the patient.  No loss of pulse, no CPR done.   Pertinent  Medical History  HTN, bladder cancer, urethral stricture s/p dilation 2022 with ongoing incomplete emptying/ LUTS CKD, HLD, PAD, GERD, restless leg, chronic lymphedema, osteoarthritis, chronic pain (left knee after multiple surgeries, followed by [facility] pain management- on gabapentin, oxycodone, Xtampza), anxiety/ depression, PUD, basal cell carcinoma, anisocoria  Significant Hospital Events: Including procedures, antibiotic start and stop dates in addition to other pertinent events   3/6 tx from [facility] ER to [facility], urosepsis, AMS, intubated on arrival  3/7 difficulty placing foley, ended up having trauma and urology was consulted  Interim History / Subjective:  Diuresed well. Vasopressor requirement decreasing. Tolerating tube feeds.   Objective   Blood pressure (!) 132/25, pulse 92, temperature 97.8 F (36.6 C), temperature source Oral, resp. rate 20,  height 5\' 11"  (1.803 m), weight 102.3 kg, SpO2 98%. CVP:  [4 mmHg-25 mmHg] 18 mmHg  Vent Mode: PRVC FiO2 (%):  [35 %-40 %] 35 % Set Rate:  [15 bmp] 15 bmp Vt Set:  [600 mL] 600 mL PEEP:  [5 cmH20] 5 cmH20 Plateau Pressure:  [15 cmH20-17 cmH20] 16 cmH20   Intake/Output Summary (Last 24 hours) at 10/20/2023 0945 Last data filed at 10/20/2023 0800 Gross per 24 hour  Intake 2603.77 ml  Output 6350 ml  Net -3746.23 ml   Filed Weights   10/18/23 2000 10/19/23 0500 10/20/23 0453  Weight: 103 kg 105.3 kg 102.3 kg    Examination: Gen:      Intubated, sedated, acutely and chronically ill appearing HEENT:  ETT to vent Lungs:    sounds of mechanical ventilation auscultated no wheeze CV:         RRR HR in 90s +JVD Abd:      + bowel sounds; soft, non-tender; no palpable masses, no distension Ext:    Bilateral lower extremity lymphedema with erythema Skin:      Warm and dry; no rashes Neuro:   sedated, RASS -3  Tracheal aspirate 3/7 with rare GNR  Resolved Hospital Problem list   Thrombocytopenia  Assessment & Plan:   Shock - cardiogenic vs septic Source cellulitis vs pneumonia Echo shows reduced RV function - co-ox 80, however echo with RV overload and exam shows JVD - cont NE for MAP goal > 65, may be able to come down on vasopressin  now - continue vancomycin, ceftriaxone, flagyl - check MRSA PCR/ trach asp - follow blood and urine cultures - trend CBC/ fever curve   Acute encephalopathy, suspected metabolic - reports of seizure activity, ceribell negative and no further seizure like events - No seizure hx.  Could be related to possible hypotension/ perfusion, hypoxia, vs withdrawal syndrome as on chronic opioids - CTH neg  Acute hypoxemic respiratory failure requiring intubation - intubated for airway protection 3/6 P:  - cont full MV support, 4-8cc/kg IBW with goal Pplat <30 and DP<15  - VAP prevention protocol/ PPI - PAD protocol for sedation> propofol changed to dex due  to elevated triglycerides, fentanyl gtt> RASS goal 0/-1, scheduled bowel regimen.  Add enteral oxy given chronic opioid use to minimize gtt - intermittent CXR/ ABG - wean FiO2 as able for SpO2 >92% - daily SAT & SBT when appropriate  - check trach asp, MRSA PCR and urine strep  Hematuria - traumatic due to foley insertion - Urology consulted, appreciate input.   - follow UOP/ renal indices closely   Prolonged Qtc - tele monitoring, trend - optimize electrolytes - avoid QTC prolonging meds  HTN HLD - hold ASA, lasix, lisinopril, and metoprolol while on pressors - hold statin with mildly elevated LFTs, remains NPO  Hypokalemia, resolved - replete prn, trend on BMET.  Mag 2  Transaminitis, mild - suspected to septic shock, LFTs better, trend periodically   Normocytic anemia- at baseline - trend CBC  Restless leg Chronic pain 2/2 chronic left knee pain Depression /anxiety - hold pta pramipexole, oxy IR, oxy ER, gabapentin  - hold wellbutrin, sertraline   Deconditioning/ immobility- wheelchair dependent  Falls - PT/ OT when appropriate   Goals of care - at baseline he is Mostly wheelchair bound, wounds on leg from his fall at home, from 2-3 falls at home over last 2-3 days.  Has had significant decline in functional status over the last year. Appreciate palliative care involvement  Best Practice (right click and "Reselect all SmartList Selections" daily)   Diet/type: NPO; TF per RD/ EN DVT prophylaxis prophylactic heparin  Pressure ulcer(s): N/A GI prophylaxis: PPI Lines: Central line Foley:  Yes, and it is still needed Code Status:  full code Last date of multidisciplinary goals of care discussion [3/7 wife updated, will update again today]  Labs   CBC: Recent Labs  Lab 10/18/23 1250 10/19/23 0504 10/19/23 1804 10/20/23 0435  WBC 4.0 7.6  --  6.7  NEUTROABS 3.0 6.2  --   --   HGB 11.6* 10.1* 10.5* 10.2*  HCT 37.0* 32.6* 33.7* 32.8*  MCV 91.8 95.9  --   94.5  PLT 149* 157  --  105*    Basic Metabolic Panel: Recent Labs  Lab 10/18/23 1250 10/19/23 0504 10/19/23 1456 10/20/23 0435  NA 139 142 133* 134*  K 3.2* 4.0 3.5 3.9  CL 109 109 104 102  CO2 25 24 21* 24  GLUCOSE 112* 99 330* 190*  BUN 25* 24* 24* 20  CREATININE 0.59* 0.80 0.87 0.89  CALCIUM 8.5* 7.6* 7.5* 7.8*  MG 2.0 1.7  --  1.8  PHOS 3.4 2.8  --  2.9   GFR: Estimated Creatinine Clearance: 83.3 mL/min (by C-G formula based on SCr of 0.89 mg/dL). Recent Labs  Lab 10/18/23 1250 10/18/23 1435 10/18/23 1542 10/19/23 0504 10/20/23 0435  PROCALCITON  --   --  <0.10  --   --   WBC 4.0  --   --  7.6 6.7  LATICACIDVEN 2.0* 0.9 1.1  --   --     Liver Function Tests: Recent Labs  Lab 10/18/23 1250 10/19/23 0839  AST 57* 45*  ALT 45* 38  ALKPHOS 97 73  BILITOT 0.6 0.9  PROT 6.8 5.7*  ALBUMIN 3.3* 2.7*   No results for input(s): "LIPASE", "AMYLASE" in the last 168 hours. No results for input(s): "AMMONIA" in the last 168 hours.  ABG    Component Value Date/Time   PHART 7.38 10/19/2023 0411   PCO2ART 42 10/19/2023 0411   PO2ART 63 (L) 10/19/2023 0411   HCO3 24.2 10/19/2023 0411   ACIDBASEDEF 0.7 10/19/2023 0411   O2SAT 80 10/19/2023 1456     Coagulation Profile: Recent Labs  Lab 10/18/23 1250 10/19/23 0504  INR 1.1 1.3*    Cardiac Enzymes: No results for input(s): "CKTOTAL", "CKMB", "CKMBINDEX", "TROPONINI" in the last 168 hours.  HbA1C: Hgb A1c MFr Bld  Date/Time Value Ref Range Status  10/19/2023 08:39 AM 5.8 (H) 4.8 - 5.6 % Final    Comment:    (NOTE) Pre diabetes:          5.7%-6.4%  Diabetes:              >6.4%  Glycemic control for   <7.0% adults with diabetes     CBG: Recent Labs  Lab 10/19/23 1613 10/19/23 2010 10/20/23 0001 10/20/23 0442 10/20/23 0810  GLUCAP 117* 125* 95 108* 127*     Critical care time:     The patient is critically ill due to respiratory failure, shock.  Critical care was necessary to treat or  prevent imminent or life-threatening deterioration.  Critical care was time spent personally by me on the following activities: development of treatment plan with patient and/or surrogate as well as nursing, discussions with consultants, evaluation of patient's response to treatment, examination of patient, obtaining history from patient or surrogate, ordering and performing treatments and interventions, ordering and review of laboratory studies, ordering and review of radiographic studies, pulse oximetry, re-evaluation of patient's condition and participation in multidisciplinary rounds.   Critical Care Time devoted to patient care services described in this note is 39 minutes. This time reflects time of care of this signee Charlott Holler . This critical care time does not reflect separately billable procedures or procedure time, teaching time or supervisory time of PA/NP/Med student/Med Resident etc but could involve care discussion time.       Charlott Holler [facility] 10/20/2023 9:58 AM  Pager: see AMION  If no response to pager , please call critical care on call (see AMION) until 7pm After 7:00 pm call Elink

## 2023-10-20 NOTE — Consult Note (Addendum)
 Urology Consult   Physician requesting consult: Dr. Henry Russel, MD  Reason for consult: Traumatic foley  History of Present Illness: Garrett Ferguson is a 78 y.o. with a past medical history of  hypertension, RLS, chronic lymphedema, PAD, CKD, GERD who presented to [facility] last night after suffering a fall.  Report however was that he had a seizure.  He presented the ED agitated hypotensive as well as in respiratory failure.  He was intubated.  While hospitalized, Foley catheter was placed and subsequently he was found to have a known latex allergy and thus this was exchanged for a silicone catheter.  Unfortunately, following this he has significant bleeding.  Urology was consulted for traumatic Foley catheter placement.  Patient is intubated sedated and thus no further HPI can be obtained from patient.  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   GERD (gastroesophageal reflux disease)    H/O: rheumatic fever    age 8   Hearing loss    History of ulcer disease 04/14/1969   Hypertension    Incontinence of urine    Restless leg syndrome    Restless legs syndrome (RLS) 08/08/2013   RSD (reflex sympathetic dystrophy)    L hand    Past Surgical History:  Procedure Laterality Date   BLADDER SURGERY     transitional cell carcinoma   carpall tunnel     L hand   CATARACT EXTRACTION W/PHACO Right 02/23/2023   Procedure: CATARACT EXTRACTION PHACO AND INTRAOCULAR LENS PLACEMENT (IOC);  Surgeon: Pecolia Ades, MD;  Location: [facility];  Service: Ophthalmology;  Laterality: Right;  CDE 11.94   CHOLECYSTECTOMY  08/14/1977   FOOT SURGERY     x5   JOINT REPLACEMENT Right replacements x3   l knee with multiple surg on  same knee   NASAL SINUS SURGERY  04/14/1989   PROSTATE BIOPSY     TOTAL KNEE REVISION  04/23/2012   Procedure: TOTAL KNEE REVISION;  Surgeon: Eulas Post, MD;  Location: [facility];  Service: Orthopedics;  Laterality: Left;   TRANSURETHRAL RESECTION OF PROSTATE      Current Hospital Medications:  Home Meds:  No current facility-administered medications on file prior to encounter.   Current Outpatient Medications on File Prior to Encounter  Medication Sig Dispense Refill   aspirin EC 81 MG tablet Take 81 mg by mouth daily.     atorvastatin (LIPITOR) 10 MG tablet Take 10 mg by mouth daily.     benzonatate (TESSALON) 100 MG capsule Take 1 capsule (100 mg total) by mouth every 8 (eight) hours. 21 capsule 0   buPROPion (WELLBUTRIN XL) 150 MG 24 hr tablet Take 150 mg by mouth daily.     cefdinir (OMNICEF) 300 MG capsule Take 1 capsule (300 mg total) by mouth every 12 (twelve) hours. 12 capsule 0   cholecalciferol (VITAMIN D3) 25 MCG (1000 UNIT) tablet Take 1,000 Units by mouth daily.     furosemide (LASIX) 20 MG tablet Take 20 mg by mouth every other day.     gabapentin (NEURONTIN) 300 MG capsule TAKE 1 CAPSULE BY MOUTH IN THE MORNING, ONE CAPSULE AT NOON AND  TWO CAPSULES AT NIGHT (Patient taking differently: Take 300 mg by mouth See admin instructions. Take 1 capsule by mouth in the morning, 1 capsule at noon, and 2 capsules at night.) 360 capsule 0   lisinopril (ZESTRIL) 20 MG tablet Take 20 mg by mouth daily.     metoprolol succinate (TOPROL-XL) 50 MG 24 hr tablet Take 50 mg by mouth daily.     Multiple Vitamin (MULTIVITAMIN WITH MINERALS) TABS Take 1 tablet by mouth daily.     naphazoline-glycerin (CLEAR EYES REDNESS) 0.012-0.25 % SOLN Place 2 drops into the left eye 4 (four) times daily as needed for eye irritation.     oxyCODONE (OXY IR/ROXICODONE) 5 MG immediate release tablet Take 5 mg by mouth 2 (two) times daily as needed.     oxyCODONE ER 9 MG C12A Take by mouth.     pramipexole  (MIRAPEX) 1 MG tablet TAKE 1 TABLET BY MOUTH TWICE DURING THE DAY AND ONE AND ONE-HALF TABLETS AT NIGHT 315 tablet 1   PREDNISOLONE ACETATE P-F 1 % ophthalmic suspension Place 1 drop into the right eye 3 (three) times daily.     sertraline (ZOLOFT) 100 MG tablet Take 200 mg by mouth daily.     tamsulosin (FLOMAX) 0.4 MG CAPS capsule Take 0.4 mg by mouth daily.     zinc gluconate 50 MG tablet Take 50 mg by mouth daily.       Scheduled Meds:  Chlorhexidine Gluconate Cloth  6 each Topical Daily   docusate  100 mg Per Tube BID   feeding supplement (PROSource TF20)  60 mL Per Tube Daily   heparin  5,000 Units Subcutaneous Q8H   insulin aspart  0-15 Units Subcutaneous Q4H   multivitamin  15 mL Per Tube Daily   mouth rinse  15 mL Mouth Rinse Q2H   oxyCODONE  5 mg Per Tube Q8H   pantoprazole (PROTONIX) IV  40 mg Intravenous Q24H   polyethylene glycol  17 g Per Tube Daily   Continuous Infusions:  cefTRIAXone (ROCEPHIN)  IV Stopped (10/19/23 1206)   feeding supplement (VITAL 1.5 CAL) 30 mL/hr at 10/20/23 0728   fentaNYL infusion INTRAVENOUS 75 mcg/hr (10/20/23 0430)   metronidazole Stopped (10/20/23 0107)   norepinephrine (LEVOPHED) Adult infusion 2 mcg/min (10/20/23 0728)   vancomycin Stopped (10/20/23 0003)   vasopressin 0.03 Units/min (10/20/23 0728)   PRN Meds:.acetaminophen **OR** acetaminophen, bisacodyl, fentaNYL, ipratropium, levalbuterol  Allergies:  Allergies  Allergen Reactions   Sulfamethoxazole-Trimethoprim    Diclofenac Sodium Rash   Latex Rash   Levofloxacin Rash    Pt developed red spots on leg and itching 2-3 hours after taking po Levaquin   Penicillins Rash    Family History  Problem Relation Age of Onset   Cancer Father    Melanoma Father    Heart disease Sister    Cancer Other    Melanoma Other    High blood pressure Other     Social History:  reports that he has never smoked. He has never used smokeless tobacco. He reports that he does not drink alcohol  and does not use drugs.  ROS: A complete review of systems was performed.  All systems are negative except for pertinent findings as noted.  Physical Exam:  Vital signs in last 24 hours: Temp:  [96.5 F (35.8 C)-98.9 F (37.2 C)] 97.8 F (36.6 C) (03/08 0833) Pulse Rate:  [49-122] 92 (03/08 0830) Resp:  [10-31] 20 (03/08 0830) BP: (90-227)/(16-188) 132/25 (03/08 0830) SpO2:  [  97 %-100 %] 98 % (03/08 0830) FiO2 (%):  [35 %-45 %] 35 % (03/08 0800) Weight:  [102.3 kg] 102.3 kg (03/08 0453) Constitutional: Intubated, sedated cardiovascular: Regular rate and rhythm Respiratory: Intubated GI: Abdomen is soft nondistended GU: Penis in place with blood around meatus and blood within Foley catheter.  Unable to irrigate catheter. Neurologic: Intubated and sedated Psychiatric: Intubated and sedated  Laboratory Data:  Recent Labs    10/18/23 1250 10/19/23 0504 10/19/23 1804 10/20/23 0435  WBC 4.0 7.6  --  6.7  HGB 11.6* 10.1* 10.5* 10.2*  HCT 37.0* 32.6* 33.7* 32.8*  PLT 149* 157  --  105*    Recent Labs    10/18/23 1250 10/19/23 0504 10/19/23 1456 10/20/23 0435  NA 139 142 133* 134*  K 3.2* 4.0 3.5 3.9  CL 109 109 104 102  GLUCOSE 112* 99 330* 190*  BUN 25* 24* 24* 20  CALCIUM 8.5* 7.6* 7.5* 7.8*  CREATININE 0.59* 0.80 0.87 0.89     Results for orders placed or performed during the hospital encounter of 10/18/23 (from the past 24 hours)  Glucose, capillary     Status: Abnormal   Collection Time: 10/19/23 11:17 AM  Result Value Ref Range   Glucose-Capillary 120 (H) 70 - 99 mg/dL   Comment 1 Notify RN    Comment 2 Document in Chart   Cooxemetry Panel (carboxy, met, total hgb, O2 sat)     Status: Abnormal   Collection Time: 10/19/23  2:56 PM  Result Value Ref Range   Total hemoglobin 10.5 (L) 12.0 - 16.0 g/dL   O2 Saturation 80 %   Carboxyhemoglobin 2.0 (H) 0.5 - 1.5 %   Methemoglobin <0.7 0.0 - 1.5 %  Basic metabolic panel     Status: Abnormal   Collection Time:  10/19/23  2:56 PM  Result Value Ref Range   Sodium 133 (L) 135 - 145 mmol/L   Potassium 3.5 3.5 - 5.1 mmol/L   Chloride 104 98 - 111 mmol/L   CO2 21 (L) 22 - 32 mmol/L   Glucose, Bld 330 (H) 70 - 99 mg/dL   BUN 24 (H) 8 - 23 mg/dL   Creatinine, Ser 6.96 0.61 - 1.24 mg/dL   Calcium 7.5 (L) 8.9 - 10.3 mg/dL   GFR, Estimated >29 >52 mL/min   Anion gap 8 5 - 15  Glucose, capillary     Status: Abnormal   Collection Time: 10/19/23  4:13 PM  Result Value Ref Range   Glucose-Capillary 117 (H) 70 - 99 mg/dL   Comment 1 Notify RN    Comment 2 Document in Chart   Hemoglobin and hematocrit, blood     Status: Abnormal   Collection Time: 10/19/23  6:04 PM  Result Value Ref Range   Hemoglobin 10.5 (L) 13.0 - 17.0 g/dL   HCT 84.1 (L) 32.4 - 40.1 %  Glucose, capillary     Status: Abnormal   Collection Time: 10/19/23  8:10 PM  Result Value Ref Range   Glucose-Capillary 125 (H) 70 - 99 mg/dL  Glucose, capillary     Status: None   Collection Time: 10/20/23 12:01 AM  Result Value Ref Range   Glucose-Capillary 95 70 - 99 mg/dL  Basic metabolic panel     Status: Abnormal   Collection Time: 10/20/23  4:35 AM  Result Value Ref Range   Sodium 134 (L) 135 - 145 mmol/L   Potassium 3.9 3.5 - 5.1 mmol/L   Chloride 102 98 -  111 mmol/L   CO2 24 22 - 32 mmol/L   Glucose, Bld 190 (H) 70 - 99 mg/dL   BUN 20 8 - 23 mg/dL   Creatinine, Ser 9.52 0.61 - 1.24 mg/dL   Calcium 7.8 (L) 8.9 - 10.3 mg/dL   GFR, Estimated >84 >13 mL/min   Anion gap 8 5 - 15  CBC     Status: Abnormal   Collection Time: 10/20/23  4:35 AM  Result Value Ref Range   WBC 6.7 4.0 - 10.5 K/uL   RBC 3.47 (L) 4.22 - 5.81 MIL/uL   Hemoglobin 10.2 (L) 13.0 - 17.0 g/dL   HCT 24.4 (L) 01.0 - 27.2 %   MCV 94.5 80.0 - 100.0 fL   MCH 29.4 26.0 - 34.0 pg   MCHC 31.1 30.0 - 36.0 g/dL   RDW 53.6 64.4 - 03.4 %   Platelets 105 (L) 150 - 400 K/uL   nRBC 0.0 0.0 - 0.2 %  Magnesium     Status: None   Collection Time: 10/20/23  4:35 AM  Result  Value Ref Range   Magnesium 1.8 1.7 - 2.4 mg/dL  Phosphorus     Status: None   Collection Time: 10/20/23  4:35 AM  Result Value Ref Range   Phosphorus 2.9 2.5 - 4.6 mg/dL  Glucose, capillary     Status: Abnormal   Collection Time: 10/20/23  4:42 AM  Result Value Ref Range   Glucose-Capillary 108 (H) 70 - 99 mg/dL  Glucose, capillary     Status: Abnormal   Collection Time: 10/20/23  8:10 AM  Result Value Ref Range   Glucose-Capillary 127 (H) 70 - 99 mg/dL   Comment 1 Notify RN    Comment 2 Document in Chart    Recent Results (from the past 240 hours)  Resp panel by RT-PCR (RSV, Flu A&B, Covid) Anterior Nasal Swab     Status: None   Collection Time: 10/18/23 12:12 PM   Specimen: Anterior Nasal Swab  Result Value Ref Range Status   SARS Coronavirus 2 by RT PCR NEGATIVE NEGATIVE Final    Comment: (NOTE) SARS-CoV-2 target nucleic acids are NOT DETECTED.  The SARS-CoV-2 RNA is generally detectable in upper respiratory specimens during the acute phase of infection. The lowest concentration of SARS-CoV-2 viral copies this assay can detect is 138 copies/mL. A negative result does not preclude SARS-Cov-2 infection and should not be used as the sole basis for treatment or other patient management decisions. A negative result may occur with  improper specimen collection/handling, submission of specimen other than nasopharyngeal swab, presence of viral mutation(s) within the areas targeted by this assay, and inadequate number of viral copies(<138 copies/mL). A negative result must be combined with clinical observations, patient history, and epidemiological information. The expected result is Negative.  Fact Sheet for Patients:  BloggerCourse.com  Fact Sheet for Healthcare Providers:  SeriousBroker.it  This test is no t yet approved or cleared by the [place] FDA and  has been authorized for detection and/or diagnosis of SARS-CoV-2  by FDA under an Emergency Use Authorization (EUA). This EUA will remain  in effect (meaning this test can be used) for the duration of the COVID-19 declaration under Section 564(b)(1) of the Act, 21 U.S.C.section 360bbb-3(b)(1), unless the authorization is terminated  or revoked sooner.       Influenza A by PCR NEGATIVE NEGATIVE Final   Influenza B by PCR NEGATIVE NEGATIVE Final    Comment: (NOTE) The Xpert Xpress SARS-CoV-2/FLU/RSV plus  assay is intended as an aid in the diagnosis of influenza from Nasopharyngeal swab specimens and should not be used as a sole basis for treatment. Nasal washings and aspirates are unacceptable for Xpert Xpress SARS-CoV-2/FLU/RSV testing.  Fact Sheet for Patients: BloggerCourse.com  Fact Sheet for Healthcare Providers: SeriousBroker.it  This test is not yet approved or cleared by the [place] FDA and has been authorized for detection and/or diagnosis of SARS-CoV-2 by FDA under an Emergency Use Authorization (EUA). This EUA will remain in effect (meaning this test can be used) for the duration of the COVID-19 declaration under Section 564(b)(1) of the Act, 21 U.S.C. section 360bbb-3(b)(1), unless the authorization is terminated or revoked.     Resp Syncytial Virus by PCR NEGATIVE NEGATIVE Final    Comment: (NOTE) Fact Sheet for Patients: BloggerCourse.com  Fact Sheet for Healthcare Providers: SeriousBroker.it  This test is not yet approved or cleared by the [place] FDA and has been authorized for detection and/or diagnosis of SARS-CoV-2 by FDA under an Emergency Use Authorization (EUA). This EUA will remain in effect (meaning this test can be used) for the duration of the COVID-19 declaration under Section 564(b)(1) of the Act, 21 U.S.C. section 360bbb-3(b)(1), unless the authorization is terminated or revoked.  Performed at  [facility], [address], [place], [place] [address]   Blood Culture (routine x 2)     Status: None (Preliminary result)   Collection Time: 10/18/23 12:50 PM   Specimen: BLOOD  Result Value Ref Range Status   Specimen Description BLOOD RFOA  Final   Special Requests   Final    Blood Culture results may not be optimal due to an inadequate volume of blood received in culture bottles BOTTLES DRAWN AEROBIC AND ANAEROBIC   Culture   Final    NO GROWTH 2 DAYS Performed at [facility], [address], [place], [place] [address]    Report Status PENDING  Incomplete  Blood Culture (routine x 2)     Status: None (Preliminary result)   Collection Time: 10/18/23 12:50 PM   Specimen: BLOOD  Result Value Ref Range Status   Specimen Description BLOOD BLOOD RIGHT ARM  Final   Special Requests   Final    BOTTLES DRAWN AEROBIC AND ANAEROBIC Blood Culture adequate volume   Culture   Final    NO GROWTH 2 DAYS Performed at [facility], [address] [place], [place] [address]    Report Status PENDING  Incomplete  Culture, blood (Routine X 2) w Reflex to ID Panel     Status: None (Preliminary result)   Collection Time: 10/18/23  9:01 PM   Specimen: BLOOD  Result Value Ref Range Status   Specimen Description   Final    BLOOD BLOOD RIGHT HAND Performed at [facility], [address], [place], [place] [address]    Special Requests   Final    BOTTLES DRAWN AEROBIC AND ANAEROBIC Blood Culture results may not be optimal due to an inadequate volume of blood received in culture bottles Performed at [facility], [address], [place], [place] [address]    Culture   Final    NO GROWTH < 24 HOURS Performed at [facility], [address] [place], [place] [address]    Report Status PENDING  Incomplete  Culture, blood (Routine X 2) w Reflex to ID Panel     Status: None (Preliminary result)   Collection Time: 10/18/23  9:01 PM  Specimen: BLOOD  Result  Value Ref Range Status   Specimen Description   Final    BLOOD BLOOD RIGHT ARM Performed at [facility], [address] [place], [place] [address]    Special Requests   Final    BOTTLES DRAWN AEROBIC AND ANAEROBIC Blood Culture results may not be optimal due to an inadequate volume of blood received in culture bottles Performed at [facility], [address], [place], [place] [address]    Culture   Final    NO GROWTH < 24 HOURS Performed at [facility], [address], [place], [place] [address]    Report Status PENDING  Incomplete  Culture, Respiratory w Gram Stain     Status: None (Preliminary result)   Collection Time: 10/19/23  8:39 AM   Specimen: Tracheal Aspirate; Respiratory  Result Value Ref Range Status   Specimen Description   Final    TRACHEAL ASPIRATE Performed at [facility], [address], [place], [place] [address]    Special Requests   Final    NONE Performed at [facility], [address] [place], [place] [address]    Gram Stain   Final    RARE WBC PRESENT,BOTH PMN AND MONONUCLEAR RARE GRAM NEGATIVE RODS Performed at [facility], [address], [place], [place] [address]    Culture PENDING  Incomplete   Report Status PENDING  Incomplete  MRSA Next Gen by PCR, Nasal     Status: None   Collection Time: 10/19/23  8:39 AM   Specimen: Nasal Mucosa; Nasal Swab  Result Value Ref Range Status   MRSA by PCR Next Gen NOT DETECTED NOT DETECTED Final    Comment: (NOTE) The GeneXpert MRSA Assay (FDA approved for NASAL specimens only), is one component of a comprehensive MRSA colonization surveillance program. It is not intended to diagnose MRSA infection nor to guide or monitor treatment for MRSA infections. Test performance is not FDA approved in patients less than 60 years old. Performed at [facility], [address] [place], [place] [address]      Renal Function: Recent Labs    10/18/23 1250 10/19/23 0504 10/19/23 1456 10/20/23 0435  CREATININE 0.59* 0.80 0.87 0.89   Estimated Creatinine Clearance: 83.3 mL/min (by C-G formula based on SCr of 0.89 mg/dL).  Radiologic Imaging: DG Abd 1 View Result Date: 10/19/2023 CLINICAL DATA:  OG tube placement EXAM: ABDOMEN - 1 VIEW COMPARISON:  Chest x-ray 10/19/2023 and older FINDINGS: Film is rotated to left. The right inferior costophrenic angle is clipped off the edge of the film. Stable ET tube and right-sided PICC. Enteric tube is seen with tip extending caudal to the imaging field and right of the spine. This could be duodenal in location. Enlarged cardiopericardial silhouette. Calcified aorta. Vascular congestion with trace edema. More confluent left retrocardiac opacity seen with a possible left effusion. No pneumothorax. IMPRESSION: Enteric tube in place with tip extending off the caudal edge of the film and into the right hemiabdomen but this could be duodenal in location. Advanced from prior abdominal x-ray. ET tube and right PICC in place. Stable enlarged heart with vascular congestion, possible edema and lung base opacities. Rotated radiograph. Electronically Signed   By: Karen Kays M.D.   On: 10/19/2023 10:53   ECHOCARDIOGRAM COMPLETE Result Date: 10/19/2023    ECHOCARDIOGRAM REPORT   Patient Name:   HUDSYN CHAMPINE Date of Exam: 10/19/2023 Medical Rec #:  [id number]       Height:       71.0 in Accession #:    [id number]      Weight:       232.1 lb Date of Birth:  Mar 18, 1946       BSA:          2.246 m Patient Age:    77 years        BP:           118/49 mmHg Patient Gender: M               HR:           7 bpm. Exam Location:  Inpatient Procedure: 2D Echo, Cardiac Doppler, Color Doppler and Intracardiac            Opacification Agent (Both Spectral and Color Flow Doppler were            utilized during procedure). Indications:    sHOCK  History:        Patient has no prior history of  Echocardiogram examinations.                 PAD; Risk Factors:Non-Smoker and Hypertension.  Sonographer:    Dondra Prader RVT RCS Referring Phys: [phone] PAULA B SIMPSON IMPRESSIONS  1. Abnormal septal motion. Left ventricular ejection fraction, by estimation, is 60 to 65%. The left ventricle has normal function. The left ventricle has no regional wall motion abnormalities. Left ventricular diastolic parameters were normal.  2. Right ventricular systolic function is moderately reduced. The right ventricular size is moderately enlarged.  3. Left atrial size was severely dilated.  4. The mitral valve is abnormal. Trivial mitral valve regurgitation. No evidence of mitral stenosis.  5. Tricuspid valve regurgitation is mild to moderate.  6. The aortic valve is tricuspid. There is moderate calcification of the aortic valve. There is moderate thickening of the aortic valve. Aortic valve regurgitation is not visualized. Aortic valve sclerosis is present, with no evidence of aortic valve stenosis.  7. The inferior vena cava is dilated in size with <50% respiratory variability, suggesting right atrial pressure of 15 mmHg. FINDINGS  Left Ventricle: Abnormal septal motion. Left ventricular ejection fraction, by estimation, is 60 to 65%. The left ventricle has normal function. The left ventricle has no regional wall motion abnormalities. Definity contrast agent was given IV to delineate the left ventricular endocardial borders. Strain was performed and the global longitudinal strain is indeterminate. The left ventricular internal cavity size was normal in size. There is no left ventricular hypertrophy. Left ventricular diastolic parameters were normal. Right Ventricle: The right ventricular size is moderately enlarged. No increase in right ventricular wall thickness. Right ventricular systolic function is moderately reduced. Left Atrium: Left atrial size was severely dilated. Right Atrium: Right atrial size was normal in size.  Pericardium: There is no evidence of pericardial effusion. Mitral Valve: The mitral valve is abnormal. There is mild thickening of the mitral valve leaflet(s). Trivial mitral valve regurgitation. No evidence of mitral valve stenosis. Tricuspid Valve: The tricuspid valve is normal in structure. Tricuspid valve regurgitation is mild to moderate. No evidence of tricuspid stenosis. Aortic Valve: The aortic valve is tricuspid. There is moderate calcification of the aortic valve. There is moderate thickening of the aortic valve. Aortic valve regurgitation is not visualized. Aortic valve sclerosis is present, with no evidence of aortic valve stenosis. Aortic valve mean gradient measures 4.0 mmHg. Aortic valve peak gradient measures 6.8 mmHg. Aortic valve  area, by VTI measures 1.83 cm. Pulmonic Valve: The pulmonic valve was normal in structure. Pulmonic valve regurgitation is not visualized. No evidence of pulmonic stenosis. Aorta: The aortic root is normal in size and structure. Venous: The inferior vena cava is dilated in size with less than 50% respiratory variability, suggesting right atrial pressure of 15 mmHg. IAS/Shunts: No atrial level shunt detected by color flow Doppler. Additional Comments: 3D was performed not requiring image post processing on an independent workstation and was indeterminate.  LEFT VENTRICLE PLAX 2D LVIDd:         5.20 cm   Diastology LVIDs:         3.80 cm   LV e' medial:    4.35 cm/s LV PW:         1.20 cm   LV E/e' medial:  16.3 LV IVS:        1.10 cm   LV e' lateral:   5.93 cm/s LVOT diam:     1.90 cm   LV E/e' lateral: 12.0 LV SV:         56 LV SV Index:   25 LVOT Area:     2.84 cm  RIGHT VENTRICLE            IVC RV Basal diam:  4.60 cm    IVC diam: 3.50 cm RV S prime:     8.93 cm/s TAPSE (M-mode): 2.3 cm LEFT ATRIUM             Index        RIGHT ATRIUM           Index LA diam:        5.20 cm 2.31 cm/m   RA Area:     18.30 cm LA Vol (A2C):   66.8 ml 29.74 ml/m  RA Volume:   50.90 ml   22.66 ml/m LA Vol (A4C):   73.7 ml 32.81 ml/m LA Biplane Vol: 70.6 ml 31.43 ml/m  AORTIC VALVE                    PULMONIC VALVE AV Area (Vmax):    1.83 cm     PV Vmax:       0.77 m/s AV Area (Vmean):   1.78 cm     PV Peak grad:  2.4 mmHg AV Area (VTI):     1.83 cm AV Vmax:           130.00 cm/s AV Vmean:          87.500 cm/s AV VTI:            0.304 m AV Peak Grad:      6.8 mmHg AV Mean Grad:      4.0 mmHg LVOT Vmax:         84.00 cm/s LVOT Vmean:        54.800 cm/s LVOT VTI:          0.196 m LVOT/AV VTI ratio: 0.64  AORTA Ao Root diam: 3.30 cm MITRAL VALVE               TRICUSPID VALVE MV Area (PHT): 3.99 cm    TR Peak grad:   39.9 mmHg MV Decel Time: 190 msec    TR Vmax:        316.00 cm/s MV E velocity: 71.10 cm/s MV A velocity: 66.80 cm/s  SHUNTS MV E/A ratio:  1.06        Systemic VTI:  0.20 m  Systemic Diam: 1.90 cm Charlton Haws MD Electronically signed by Charlton Haws MD Signature Date/Time: 10/19/2023/10:01:33 AM    Final    DG CHEST PORT 1 VIEW Result Date: 10/19/2023 CLINICAL DATA:  78 year old male respiratory failure. EXAM: PORTABLE CHEST 1 VIEW COMPARISON:  Portable chest yesterday. FINDINGS: Portable AP semi upright view at 0418 hours. Endotracheal tube tip at the level the clavicles. Enteric tube courses to the abdomen, tip not included. Stable right PICC line. Similar patient rotation to the left. Cardiomegaly. Left lung base hypo ventilation with obscured left hemidiaphragm. No pneumothorax. Mild veiling right lung base opacity is increased. Elsewhere ventilation is stable. Pulmonary vascularity not significantly changed. Stable visualized osseous structures. IMPRESSION: 1. Stable cardiomegaly with left greater than right lung base opacity, could be a combination of small pleural effusions, atelectasis, infection. 2. Pulmonary vascularity not significantly changed. Visible line and tube position not significantly changed. Electronically Signed   By: Odessa Fleming M.D.    On: 10/19/2023 07:00   DG Abd 1 View Result Date: 10/19/2023 CLINICAL DATA:  78 year old male enteric tube position. EXAM: ABDOMEN - 1 VIEW COMPARISON:  Portable chest yesterday, 1954 hours. FINDINGS: AP view at 0415 hours. Enteric tube placed into the stomach, side hole at the level of the mid gastric body. Nonobstructed bowel-gas pattern with combined gas and retained stool in the visible abdomen. No acute osseous abnormality identified. IMPRESSION: Satisfactory enteric tube placement into the stomach. Electronically Signed   By: Odessa Fleming M.D.   On: 10/19/2023 06:59   DG CHEST PORT 1 VIEW Result Date: 10/18/2023 CLINICAL DATA:  Intubated EXAM: PORTABLE CHEST 1 VIEW COMPARISON:  10/18/2023, 03/21/2023 FINDINGS: Interval intubation, tip of the endotracheal tube is about 3.6 cm superior to carina. Right upper extremity central venous catheter tip over the cavoatrial region. Cardiomegaly with worsened vascular congestion and diffu increased perihilar interstitial and ground-glass opacity probably edema. No pleural effusion or pneumothorax IMPRESSION: 1. Interval intubation, tip of endotracheal tube is about 3.6 cm superior to carina 2. Cardiomegaly with worsened vascular congestion and bilateral interstitial and ground-glass opacities suspicious for edema Electronically Signed   By: Jasmine Pang M.D.   On: 10/18/2023 22:05   DG Chest Portable 1 View Result Date: 10/18/2023 CLINICAL DATA:  PICC line placement EXAM: PORTABLE CHEST 1 VIEW COMPARISON:  10/18/2023, 03/21/2023 FINDINGS: Cardiomegaly with vascular congestion and possible mild edema versus bronchitic change. Right upper extremity central venous catheter tip difficult to visualize secondary to patient positioning and superimposition artifact, suspect tip is in the region of the cavoatrial junction. IMPRESSION: 1. Right upper extremity central venous catheter tip difficult to see for reasons above, suspect tip may be in the region of cavoatrial junction  2. Cardiomegaly with vascular congestion. Diffuse interstitial opacity which may be due to mild edema or interstitial inflammatory process Electronically Signed   By: Jasmine Pang M.D.   On: 10/18/2023 18:44   [place] EKG SITE RITE Result Date: 10/18/2023 If Site Rite image not attached, placement could not be confirmed due to current cardiac rhythm.  CT Head Wo Contrast Result Date: 10/18/2023 CLINICAL DATA:  Fall, seizure, neuro deficits/altered mental status. EXAM: CT HEAD WITHOUT CONTRAST TECHNIQUE: Contiguous axial images were obtained from the base of the skull through the vertex without intravenous contrast. RADIATION DOSE REDUCTION: This exam was performed according to the departmental dose-optimization program which includes automated exposure control, adjustment of the mA and/or kV according to patient size and/or use of iterative reconstruction technique. COMPARISON:  CT head 10/15/2022  FINDINGS: Brain: No acute intracranial hemorrhage. No CT evidence of acute infarct. Nonspecific hypoattenuation in the periventricular and subcortical white matter favored to reflect chronic microvascular ischemic changes. No edema, mass effect, or midline shift. The basilar cisterns are patent. Ventricles: Prominence of the ventricles suggesting underlying parenchymal volume loss. Vascular: No hyperdense vessel or unexpected calcification. Skull: No acute or aggressive finding. Orbits: Orbits are symmetric. Sinuses: Postsurgical changes of the right maxillary sinus. Mucosal thickening in the ethmoid sinuses. Other: Mastoid air cells are clear. IMPRESSION: 1. No CT evidence of acute intracranial abnormality. 2. Chronic microvascular ischemic changes and parenchymal volume loss. Electronically Signed   By: Emily Filbert M.D.   On: 10/18/2023 14:22   DG Chest Port 1 View Result Date: 10/18/2023 CLINICAL DATA:  Possible sepsis. EXAM: PORTABLE CHEST 1 VIEW COMPARISON:  March 21, 2023. FINDINGS: Stable cardiomegaly with mild  central pulmonary vascular congestion. Minimal pulmonary edema may be present. Bony thorax is unremarkable. IMPRESSION: Stable cardiomegaly with mild central pulmonary vascular congestion and possible minimal pulmonary edema. Electronically Signed   By: Lupita Raider M.D.   On: 10/18/2023 14:01    I independently reviewed the above imaging studies.  Procedure note: Indwelling Foley catheter was removed.  Penis was prepped and draped.  I advanced a flexible 17 French cystoscope into the urethra and into the bulbar urethra, there is significant bulbar urethra and prostatic trauma.  There was clots throughout the urethra and with great difficulty, was ultimately able to find the true lumen anteriorly and advanced the scope into the prostatic urethra and finally into the bladder.  The bladder appeared to be distended.  Limited visualization revealed no evidence of underlying bladder mass.  There were no clots in the bladder.  Next, a 0.038 sensor wire was advanced into the bladder and curl.  Over this, I placed an 67 French silicone catheter after making this a council tip using a 14-gauge needle, there was return of clear yellow urine.  Foley catheter was left to gravity drainage.  Update 10/20/2023:  Foley draining clear yellow urine Excellent UOP  Impression/Recommendation Recommend leaving catheter in place for least 7 days or longer pending his overall recovery given the significant trauma sustained with prior Foley catheter attempt. -Following peripherally.  Please call with questions.  Roby Lofts, MD Resident Physician [facility]  10/20/2023, 9:08 AM  [facility]  Pager: [phone]  I have seen and examined the patient and agree with the above assessment and plan.  Foley draining clear yellow urine. Keep in place for at least 7 days prior to void trial given extensive trauma. Please call with questions.  Matt R. Nuchem Grattan MD [facility]  Pager: [phone]

## 2023-10-20 NOTE — Progress Notes (Signed)
 PT Cancellation Note  Patient Details Name: Garrett Ferguson MRN: [id number] DOB: [birth year]   Cancelled Treatment:    Reason Eval/Treat Not Completed: Patient not medically ready    Faye Ramsay, PT Acute Rehabilitation  Office: [phone]

## 2023-10-21 ENCOUNTER — Inpatient Hospital Stay (HOSPITAL_COMMUNITY)

## 2023-10-21 DIAGNOSIS — J9601 Acute respiratory failure with hypoxia: Secondary | ICD-10-CM | POA: Diagnosis not present

## 2023-10-21 DIAGNOSIS — A419 Sepsis, unspecified organism: Secondary | ICD-10-CM | POA: Diagnosis not present

## 2023-10-21 DIAGNOSIS — R6521 Severe sepsis with septic shock: Secondary | ICD-10-CM | POA: Diagnosis not present

## 2023-10-21 DIAGNOSIS — G9340 Encephalopathy, unspecified: Secondary | ICD-10-CM | POA: Diagnosis not present

## 2023-10-21 DIAGNOSIS — R6520 Severe sepsis without septic shock: Secondary | ICD-10-CM | POA: Diagnosis not present

## 2023-10-21 DIAGNOSIS — Z7189 Other specified counseling: Secondary | ICD-10-CM | POA: Diagnosis not present

## 2023-10-21 DIAGNOSIS — Z515 Encounter for palliative care: Secondary | ICD-10-CM | POA: Diagnosis not present

## 2023-10-21 LAB — GLUCOSE, CAPILLARY
Glucose-Capillary: 100 mg/dL — ABNORMAL HIGH (ref 70–99)
Glucose-Capillary: 112 mg/dL — ABNORMAL HIGH (ref 70–99)
Glucose-Capillary: 113 mg/dL — ABNORMAL HIGH (ref 70–99)
Glucose-Capillary: 86 mg/dL (ref 70–99)
Glucose-Capillary: 86 mg/dL (ref 70–99)
Glucose-Capillary: 92 mg/dL (ref 70–99)
Glucose-Capillary: 94 mg/dL (ref 70–99)

## 2023-10-21 LAB — CBC
HCT: 32.3 % — ABNORMAL LOW (ref 39.0–52.0)
Hemoglobin: 10 g/dL — ABNORMAL LOW (ref 13.0–17.0)
MCH: 29.3 pg (ref 26.0–34.0)
MCHC: 31 g/dL (ref 30.0–36.0)
MCV: 94.7 fL (ref 80.0–100.0)
Platelets: 110 10*3/uL — ABNORMAL LOW (ref 150–400)
RBC: 3.41 MIL/uL — ABNORMAL LOW (ref 4.22–5.81)
RDW: 15.7 % — ABNORMAL HIGH (ref 11.5–15.5)
WBC: 7.4 10*3/uL (ref 4.0–10.5)
nRBC: 0 % (ref 0.0–0.2)

## 2023-10-21 LAB — CULTURE, RESPIRATORY W GRAM STAIN: Culture: NO GROWTH

## 2023-10-21 LAB — BASIC METABOLIC PANEL
Anion gap: 8 (ref 5–15)
BUN: 21 mg/dL (ref 8–23)
CO2: 25 mmol/L (ref 22–32)
Calcium: 8.2 mg/dL — ABNORMAL LOW (ref 8.9–10.3)
Chloride: 102 mmol/L (ref 98–111)
Creatinine, Ser: 0.85 mg/dL (ref 0.61–1.24)
GFR, Estimated: >60 mL/min (ref >60)
Glucose, Bld: 95 mg/dL (ref 70–99)
Potassium: 3.8 mmol/L (ref 3.5–5.1)
Sodium: 135 mmol/L (ref 135–145)

## 2023-10-21 NOTE — Progress Notes (Signed)
 OT Cancellation Note  Patient Details Name: DADEN MAHANY MRN: [id number] DOB: 04/23/1946   Cancelled Treatment:    Reason Eval/Treat Not Completed: Medical issues which prohibited therapy;Patient not medically ready: Spoke with RN who advised for OT to sign off for now as pt remains intubated without any known plan to extubate for now.   Please re-order OT if pt medically progresses to point of tolerating gentle PT and OT. Thank you.   Theodoro Clock 10/21/2023, 12:05 PM

## 2023-10-21 NOTE — Progress Notes (Signed)
 PT Cancellation/Screen Note  Patient Details Name: Garrett Ferguson MRN: [id number] DOB: [birth year]   Cancelled Treatment:    Reason Eval/Treat Not Completed: Patient not medically ready. Pt remains on ventilator. PT will sign off at this time. Please reorder once pt is medically ready and able to participate with therapy.    Faye Ramsay, PT Acute Rehabilitation  Office: [phone]

## 2023-10-21 NOTE — Progress Notes (Signed)
 NAME:  Garrett Ferguson, MRN:  [id number], DOB:  August 11, 1946, LOS: 3 ADMISSION DATE:  10/18/2023, CONSULTATION DATE:  10/18/23 REFERRING MD:  Dr. Flossie Dibble, CHIEF COMPLAINT:  AMS   Brief HPI:  78 y/o male with PMH for Bladder cancer, HTN, RLS, Chronic Lymphedema LE, PAD, CKD, GERD who had a fall at home (had fall night before as well but EMS checked his vitals and did not find him to a candidate for hospital), BIBA to [facility].  Verbal at home but then had a seizure.  Seizure lasting about 2 min per EMS, BG 109.  EMS placed in on 100% NRM and in the ED he had a nasal trumpet placed.  He was confused and agitated.  He was hypothermic 32.4 and Hypotensive 78/50 LA 2.0.  RVR, Influenza A and Covid negative.  UA positive for cloudy and increased WBC.  Sepsis protocol started.  He was transferred to [name] ICU.  He presented unresponsive no response to pain/sternal rubs.  Presented with Albumin and Levophed running.  He did not have a aga either.  Code called for respiratory arrest and Dr Jarold Motto intubated the patient.  No loss of pulse, no CPR done.   Pertinent  Medical History  HTN, bladder cancer, urethral stricture s/p dilation 2022 with ongoing incomplete emptying/ LUTS CKD, HLD, PAD, GERD, restless leg, chronic lymphedema, osteoarthritis, chronic pain (left knee after multiple surgeries, followed by [facility] pain management- on gabapentin, oxycodone, Xtampza), anxiety/ depression, PUD, basal cell carcinoma, anisocoria  Significant Hospital Events: Including procedures, antibiotic start and stop dates in addition to other pertinent events   3/6 tx from [facility] ER to [facility], urosepsis, AMS, intubated on arrival  3/7 difficulty placing foley, ended up having trauma and urology was consulted  Interim History / Subjective:  On fentanyl gtt 25 mcg. On PS trial this am. Still on low dose norepi. Good uop 3.4L with net negative 1.4L  Objective   Blood pressure (!) 128/43, pulse 86, temperature 98.6 F (37 C), temperature  source Oral, resp. rate (!) 21, height 5\' 11"  (1.803 m), weight 100 kg, SpO2 97%. CVP:  [12 mmHg-32 mmHg] 13 mmHg  Vent Mode: PSV;CPAP FiO2 (%):  [30 %] 30 % Set Rate:  [15 bmp] 15 bmp Vt Set:  [600 mL] 600 mL PEEP:  [5 cmH20] 5 cmH20 Pressure Support:  [8 cmH20] 8 cmH20 Plateau Pressure:  [17 cmH20] 17 cmH20   Intake/Output Summary (Last 24 hours) at 10/21/2023 1042 Last data filed at 10/21/2023 0600 Gross per 24 hour  Intake 1676.42 ml  Output 3425 ml  Net -1748.58 ml   Filed Weights   10/19/23 0500 10/20/23 0453 10/21/23 0500  Weight: 105.3 kg 102.3 kg 100 kg    Examination: Gen:      Intubated, sedated, acutely and chronically ill appearing HEENT:  ETT to vent Lungs:    sounds of mechanical ventilation auscultated no wheeze CV:         RRR Abd:      + bowel sounds; soft, non-tender; no palpable masses, no distension Ext:   Bilateral non pititing edema with chronic lymphedema changes Skin:      Warm and dry; no rashes Neuro:   sedated, RASS -1, very weakly wiggles toes  Mrsa swab negative Tracheal aspirate 3/7 with rare GNR  Resolved Hospital Problem list   Thrombocytopenia  Assessment & Plan:   Shock - cardiogenic vs septic Source cellulitis vs pneumonia Echo shows reduced RV function - co-ox 80, however echo  with RV overload and exam shows JVD - cont NE for MAP goal > 65, currently on 4 mcg. - continue vancomycin, ceftriaxone, flagyl - check MRSA PCR/ trach asp - follow blood and urine cultures - trend CBC/ fever curve   Acute encephalopathy, suspected metabolic - reports of seizure activity, ceribell negative and no further seizure like events - No seizure hx.  Could be related to possible hypotension/ perfusion, hypoxia, vs withdrawal syndrome as on chronic opioids - CTH neg - he is now following commands so will defer MRI for now.   Acute hypoxemic respiratory failure requiring intubation - intubated for airway protection 3/6 P:  - cont full MV support,  4-8cc/kg IBW with goal Pplat <30 and DP<15  - VAP prevention protocol/ PPI - PAD protocol for sedation> propofol changed to dex due to elevated triglycerides, fentanyl gtt> RASS goal 0/-1, scheduled bowel regimen.  Add enteral oxy given chronic opioid use to minimize gtt - intermittent CXR/ ABG - wean FiO2 as able for SpO2 >92% - daily SAT & SBT when appropriate  - check trach asp, MRSA PCR and urine strep  Hematuria - traumatic due to foley insertion - Urology consulted, appreciate input.   - follow UOP/ renal indices closely   Prolonged Qtc - tele monitoring, trend - optimize electrolytes - avoid QTC prolonging meds  HTN HLD - hold ASA, lasix, lisinopril, and metoprolol while on pressors - hold statin with mildly elevated LFTs, remains NPO  Hypokalemia, resolved - replete prn, trend on BMET.  Mag 2  Transaminitis, mild - suspected to septic shock, LFTs better, trend periodically   Normocytic anemia- at baseline - trend CBC  Restless leg Chronic pain 2/2 chronic left knee pain Depression /anxiety - hold pta pramipexole, oxy IR, oxy ER, gabapentin  - hold wellbutrin, sertraline   Deconditioning/ immobility- wheelchair dependent  Falls - PT/ OT when appropriate   Goals of care - at baseline he is Mostly wheelchair bound, wounds on leg from his fall at home, from 2-3 falls at home over last 2-3 days.  Has had significant decline in functional status over the last year. Appreciate palliative care involvement  Best Practice (right click and "Reselect all SmartList Selections" daily)   Diet/type: tubefeeds, NPO, and NPO w/ meds via tube DVT prophylaxis prophylactic heparin  Pressure ulcer(s): N/A GI prophylaxis: PPI Lines: Central line Foley:  Yes, and it is still needed Code Status:  full code Last date of multidisciplinary goals of care discussion [3/8 wife updated, will update again today. Need to discuss goals of care and longterm desire for tracheostomy before  attempting extubation]  Labs   CBC: Recent Labs  Lab 10/18/23 1250 10/19/23 0504 10/19/23 1804 10/20/23 0435 10/21/23 0515  WBC 4.0 7.6  --  6.7 7.4  NEUTROABS 3.0 6.2  --   --   --   HGB 11.6* 10.1* 10.5* 10.2* 10.0*  HCT 37.0* 32.6* 33.7* 32.8* 32.3*  MCV 91.8 95.9  --  94.5 94.7  PLT 149* 157  --  105* 110*    Basic Metabolic Panel: Recent Labs  Lab 10/18/23 1250 10/19/23 0504 10/19/23 1456 10/20/23 0435 10/21/23 0515  NA 139 142 133* 134* 135  K 3.2* 4.0 3.5 3.9 3.8  CL 109 109 104 102 102  CO2 25 24 21* 24 25  GLUCOSE 112* 99 330* 190* 95  BUN 25* 24* 24* 20 21  CREATININE 0.59* 0.80 0.87 0.89 0.85  CALCIUM 8.5* 7.6* 7.5* 7.8* 8.2*  MG 2.0  1.7  --  1.8  --   PHOS 3.4 2.8  --  2.9  --    GFR: Estimated Creatinine Clearance: 86.3 mL/min (by C-G formula based on SCr of 0.85 mg/dL). Recent Labs  Lab 10/18/23 1250 10/18/23 1435 10/18/23 1542 10/19/23 0504 10/20/23 0435 10/21/23 0515  PROCALCITON  --   --  <0.10  --   --   --   WBC 4.0  --   --  7.6 6.7 7.4  LATICACIDVEN 2.0* 0.9 1.1  --   --   --     Liver Function Tests: Recent Labs  Lab 10/18/23 1250 10/19/23 0839  AST 57* 45*  ALT 45* 38  ALKPHOS 97 73  BILITOT 0.6 0.9  PROT 6.8 5.7*  ALBUMIN 3.3* 2.7*   No results for input(s): "LIPASE", "AMYLASE" in the last 168 hours. No results for input(s): "AMMONIA" in the last 168 hours.  ABG    Component Value Date/Time   PHART 7.38 10/19/2023 0411   PCO2ART 42 10/19/2023 0411   PO2ART 63 (L) 10/19/2023 0411   HCO3 24.2 10/19/2023 0411   ACIDBASEDEF 0.7 10/19/2023 0411   O2SAT 80 10/19/2023 1456     Coagulation Profile: Recent Labs  Lab 10/18/23 1250 10/19/23 0504  INR 1.1 1.3*    Cardiac Enzymes: No results for input(s): "CKTOTAL", "CKMB", "CKMBINDEX", "TROPONINI" in the last 168 hours.  HbA1C: Hgb A1c MFr Bld  Date/Time Value Ref Range Status  10/19/2023 08:39 AM 5.8 (H) 4.8 - 5.6 % Final    Comment:    (NOTE) Pre diabetes:           5.7%-6.4%  Diabetes:              >6.4%  Glycemic control for   <7.0% adults with diabetes     CBG: Recent Labs  Lab 10/20/23 1558 10/20/23 2019 10/21/23 0020 10/21/23 0425 10/21/23 0811  GLUCAP 112* 109* 92 86 100*     Critical care time:     The patient is critically ill due to respiratory failure, shock.  Critical care was necessary to treat or prevent imminent or life-threatening deterioration.  Critical care was time spent personally by me on the following activities: development of treatment plan with patient and/or surrogate as well as nursing, discussions with consultants, evaluation of patient's response to treatment, examination of patient, obtaining history from patient or surrogate, ordering and performing treatments and interventions, ordering and review of laboratory studies, ordering and review of radiographic studies, pulse oximetry, re-evaluation of patient's condition and participation in multidisciplinary rounds.   Critical Care Time devoted to patient care services described in this note is 38 minutes. This time reflects time of care of this signee Charlott Holler . This critical care time does not reflect separately billable procedures or procedure time, teaching time or supervisory time of PA/NP/Med student/Med Resident etc but could involve care discussion time.       Charlott Holler [facility] 10/21/2023 10:50 AM  Pager: see AMION  If no response to pager , please call critical care on call (see AMION) until 7pm After 7:00 pm call Elink

## 2023-10-21 NOTE — Progress Notes (Signed)
 Patients Vent began to alarm and his respiratory rate increased to mid to upper 30s. Upon assessment I saw that the OG tube dislodged and was measured at 60. Tube feed was stopped, oral and endotracheal suctioning was preformed. Minimal sputum was noted and was clear. CCM was notified and KUB was ordered.   Beatrix Shipper

## 2023-10-21 NOTE — Progress Notes (Signed)
   10/21/23 0855  Vent Select  Invasive or Noninvasive Invasive  Adult Vent Y  Airway 7.5 mm  Placement Date/Time: 10/18/23 1939   Placed By: ED Physician  Airway Device: Endotracheal Tube  ETT Types: Oral  Size (mm): 7.5 mm  Cuffed: Cuffed  Insertion attempts: 1  Airway Equipment: Stylet;Video Laryngoscope  Placement Confirmation: Direct Visu...  Secured at (cm) 24 cm  Measured From Lips  Secured Location Center  Secured By English as a second language teacher No  Tube Holder Repositioned Yes  Prone position No  Head position Left  Cuff Pressure (cm H2O) Green OR 18-26 CmH2O  Site Condition Drainage (Comment)  Adult Ventilator Settings  Vent Type Servo i  Humidity HME  Vent Mode (S)  PSV;CPAP  FiO2 (%) (S)  30 %  Pressure Support (S)  8 cmH20  PEEP (S)  5 cmH20  Adult Ventilator Measurements  Peak Airway Pressure 15 L/min  Mean Airway Pressure 8.2 cmH20  Resp Rate Spontaneous 18 br/min  Resp Rate Total 18 br/min  Exhaled Vt 533 mL  Measured Ve 10.7 L  Total PEEP 5 cmH20  SpO2 97 %  Adult Ventilator Alarms  Alarms On Y  Ve High Alarm 20 L/min  Ve Low Alarm 4 L/min  Resp Rate High Alarm 30 br/min  Resp Rate Low Alarm 10  PEEP Low Alarm 2 cmH2O  Press High Alarm 40 cmH2O  T Apnea 20 sec(s)  VAP Prevention  HOB> 30 Degrees Y  Daily Weaning Assessment  Weaning Start Time 0855  Breath Sounds  Bilateral Breath Sounds Diminished  Vent Respiratory Assessment  Level of Consciousness Responds to Voice  Respiratory Pattern Regular;Unlabored  Patient Tolerance Tolerated well  Suction Method  Respiratory Interventions Oral suction;Airway suction  Oral Suctioning/Secretions  Suction Type Oral  Suction Device Yankauer  Secretion Amount Small  Secretion Color White;Clear  Secretion Consistency Thin  Suction Tolerance Tolerated well  Suctioning Adverse Effects None  Airway Suctioning/Secretions  Suction Type ETT  Suction Device  Catheter  Secretion Amount Small   Secretion Color White;Tan  Secretion Consistency Thin;Thick  Suction Tolerance Tolerated well  Suctioning Adverse Effects None

## 2023-10-21 NOTE — Progress Notes (Signed)
   10/21/23 1820  Adult Ventilator Settings  Vent Mode (S)  PRVC (Pt weaned 9 hrs. 20 mins on PSV 8/5. Placed back on full support for rest, family @ the bedside.)

## 2023-10-21 NOTE — Progress Notes (Signed)
 Daily Progress Note   Patient Name: Garrett Ferguson       Date: 10/21/2023 DOB: May 31, 1946  Age: 78 y.o. MRN#: [id number] Attending Physician: Charlott Holler, MD Primary Care Physician: Care, [place] Primary Admit Date: 10/18/2023  Reason for Consultation/Follow-up: Establishing goals of care  Subjective: Needs to be intubated, noted to be responding more.  Length of Stay: 3  Current Medications: Scheduled Meds:   Chlorhexidine Gluconate Cloth  6 each Topical Daily   docusate  100 mg Per Tube BID   feeding supplement (PROSource TF20)  60 mL Per Tube Daily   heparin  5,000 Units Subcutaneous Q8H   insulin aspart  0-9 Units Subcutaneous Q4H   multivitamin  15 mL Per Tube Daily   mouth rinse  15 mL Mouth Rinse Q2H   oxyCODONE  5 mg Per Tube Q8H   pantoprazole (PROTONIX) IV  40 mg Intravenous Q24H   polyethylene glycol  17 g Per Tube Daily    Continuous Infusions:  cefTRIAXone (ROCEPHIN)  IV Stopped (10/21/23 1130)   feeding supplement (VITAL 1.5 CAL) 40 mL/hr at 10/21/23 0400   fentaNYL infusion INTRAVENOUS Stopped (10/20/23 1726)   metronidazole Stopped (10/21/23 1348)   norepinephrine (LEVOPHED) Adult infusion 4 mcg/min (10/21/23 0400)   vancomycin Stopped (10/21/23 0028)   vasopressin Stopped (10/20/23 0827)    PRN Meds: acetaminophen **OR** acetaminophen, bisacodyl, fentaNYL, ipratropium, levalbuterol  Physical Exam         Intubated sedated Chronically ill-appearing Patient on mechanical ventilation endotracheal tube Monitor noted Has chronic lymphedema, bilateral lower extremity edema  Vital Signs: BP (!) 120/38 (BP Location: Left Arm)   Pulse 80   Temp 99.3 F (37.4 C) (Oral)   Resp (!) 22   Ht 5\' 11"  (1.803 m)   Wt 100 kg   SpO2 98%   BMI 30.75 kg/m  SpO2:  SpO2: 98 % O2 Device: O2 Device: Ventilator O2 Flow Rate:    Intake/output summary:  Intake/Output Summary (Last 24 hours) at 10/21/2023 1431 Last data filed at 10/21/2023 1221 Gross per 24 hour  Intake 1385.77 ml  Output 4025 ml  Net -2639.23 ml   LBM: Last BM Date :  (PTA) Baseline Weight: Weight: 103 kg Most recent weight: Weight: 100 kg       Palliative Assessment/Data:  Patient Active Problem List   Diagnosis Date Noted   Hypotensive episode 10/18/2023   History of bladder cancer 10/18/2023   Lymphedema 10/18/2023   Hypoalbuminemia 10/18/2023   Septic shock (HCC) 10/18/2023   Acute hypoxic respiratory failure (HCC) 10/18/2023   Severe sepsis (HCC) 03/22/2023   UTI (urinary tract infection) 03/21/2023   Acute encephalopathy 03/21/2023   Hypertension    Dysrhythmia    Restless legs syndrome (RLS) 08/08/2013   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  Palliative following for CODE STATUS and broad goals of care discussions.  Recommendations/Plan: Discussed with patient's wife on the phone, subsequently arrived to bedside when she arrived at the hospital this afternoon and also met with her face-to-face.  Reviewed and recapped current hospitalization thus far.  Patient's wife states she is encouraged that he is beginning to be more alert and that he is responding to her voice.  Offered active listening and supportive empathic presence.  Goals of care discussions undertaken.  CODE STATUS discussions undertaken.  Patient's wife does not believe  that the patient would want to undergo CPR in the case of a cardiac arrest.  Will establish a DNR -no CPR as per patient's previously expressed wishes.  We then discussed about the fact that spontaneous breathing trials are going on, we discussed about what she would choose once the patient is able to be extubated-tracheostomy versus more of a comfort focused to route if the patient is not able to sustain the work of his breathing once the endotracheal tube is out.  Wife again emotional and tearful, she does not believe that the patient would want a tracheostomy however wall does not want to make this decision right now.  For now, she wishes for continuation of current mode of care.  Offered support, palliative will continue to follow along.    Code Status:    Code Status Orders  (From admission, onward)           Start     Ordered   10/21/23 1432  Do not attempt resuscitation (DNR) Pre-Arrest Interventions Desired  (Code Status)  Continuous       Question Answer Comment  If pulseless and not breathing No CPR or chest compressions.   In Pre-Arrest Conditions (Patient Has Pulse and Is Breathing) May intubate, use advanced airway interventions and cardioversion/ACLS medications if appropriate or indicated. May transfer to ICU.   Consent: Discussion documented in EHR or advanced directives reviewed      10/21/23 1431           Code Status History     Date Active Date Inactive Code Status Order ID Comments User Context   10/18/2023 2039 10/21/2023 1431 Full Code [id number]  Rozann Lesches, MD Inpatient   10/18/2023 1444 10/18/2023 2039 Full Code [id number]  Kendell Bane, MD ED   03/21/2023 2331 03/27/2023 1928 Full Code [id number]  Briscoe Deutscher, MD ED   04/23/2012 1506 04/26/2012 1500 Full Code [id number]  George Hugh, RN Inpatient       Prognosis:  Unable to determine  Discharge Planning: To Be Determined  Care plan was discussed with  wife on  phone and also in person when she  arrived at bedside.  Thank you for allowing the Palliative Medicine Team to assist in the care of this patient. High MDM.      Greater than 50%  of this time was spent counseling and coordinating care related to the above assessment and plan.  Rosalin Hawking, MD  Please contact Palliative Medicine Team phone at [phone] for questions and concerns.

## 2023-10-22 ENCOUNTER — Inpatient Hospital Stay (HOSPITAL_COMMUNITY)

## 2023-10-22 DIAGNOSIS — G9341 Metabolic encephalopathy: Secondary | ICD-10-CM

## 2023-10-22 DIAGNOSIS — I4891 Unspecified atrial fibrillation: Secondary | ICD-10-CM | POA: Diagnosis not present

## 2023-10-22 DIAGNOSIS — R6520 Severe sepsis without septic shock: Secondary | ICD-10-CM | POA: Diagnosis not present

## 2023-10-22 DIAGNOSIS — A419 Sepsis, unspecified organism: Secondary | ICD-10-CM | POA: Diagnosis not present

## 2023-10-22 DIAGNOSIS — J9601 Acute respiratory failure with hypoxia: Secondary | ICD-10-CM | POA: Diagnosis not present

## 2023-10-22 LAB — BASIC METABOLIC PANEL
Anion gap: 9 (ref 5–15)
BUN: 23 mg/dL (ref 8–23)
CO2: 26 mmol/L (ref 22–32)
Calcium: 8.6 mg/dL — ABNORMAL LOW (ref 8.9–10.3)
Chloride: 102 mmol/L (ref 98–111)
Creatinine, Ser: 0.64 mg/dL (ref 0.61–1.24)
GFR, Estimated: >60 mL/min (ref >60)
Glucose, Bld: 89 mg/dL (ref 70–99)
Potassium: 4.3 mmol/L (ref 3.5–5.1)
Sodium: 137 mmol/L (ref 135–145)

## 2023-10-22 LAB — CBC
HCT: 37 % — ABNORMAL LOW (ref 39.0–52.0)
Hemoglobin: 11.2 g/dL — ABNORMAL LOW (ref 13.0–17.0)
MCH: 29.4 pg (ref 26.0–34.0)
MCHC: 30.3 g/dL (ref 30.0–36.0)
MCV: 97.1 fL (ref 80.0–100.0)
Platelets: 124 10*3/uL — ABNORMAL LOW (ref 150–400)
RBC: 3.81 MIL/uL — ABNORMAL LOW (ref 4.22–5.81)
RDW: 15.5 % (ref 11.5–15.5)
WBC: 8.2 10*3/uL (ref 4.0–10.5)
nRBC: 0 % (ref 0.0–0.2)

## 2023-10-22 LAB — GLUCOSE, CAPILLARY
Glucose-Capillary: 82 mg/dL (ref 70–99)
Glucose-Capillary: 74 mg/dL (ref 70–99)
Glucose-Capillary: 98 mg/dL (ref 70–99)
Glucose-Capillary: 104 mg/dL — ABNORMAL HIGH (ref 70–99)
Glucose-Capillary: 151 mg/dL — ABNORMAL HIGH (ref 70–99)

## 2023-10-22 MED ORDER — FUROSEMIDE 10 MG/ML IJ SOLN
40.0000 mg | Freq: Every day | INTRAMUSCULAR | Status: DC
  Administered 2023-10-22 – 2023-10-23 (×2): 40 mg via INTRAVENOUS
  Filled 2023-10-22 (×2): qty 4

## 2023-10-22 MED ORDER — POTASSIUM CHLORIDE 10 MEQ/100ML IV SOLN
10.0000 meq | INTRAVENOUS | Status: AC
  Administered 2023-10-22 (×4): 10 meq via INTRAVENOUS
  Filled 2023-10-22 (×4): qty 100

## 2023-10-22 MED ORDER — AMIODARONE HCL IN DEXTROSE 360-4.14 MG/200ML-% IV SOLN
30.0000 mg/h | INTRAVENOUS | Status: DC
  Administered 2023-10-23 – 2023-10-24 (×4): 30 mg/h via INTRAVENOUS
  Filled 2023-10-22 (×4): qty 200

## 2023-10-22 MED ORDER — SODIUM CHLORIDE 0.9 % IV SOLN
2.0000 g | INTRAVENOUS | Status: AC
  Administered 2023-10-22 – 2023-10-24 (×3): 2 g via INTRAVENOUS
  Filled 2023-10-22 (×4): qty 20

## 2023-10-22 MED ORDER — AMIODARONE HCL IN DEXTROSE 360-4.14 MG/200ML-% IV SOLN
60.0000 mg/h | INTRAVENOUS | Status: AC
  Administered 2023-10-22 (×2): 60 mg/h via INTRAVENOUS
  Filled 2023-10-22 (×2): qty 200

## 2023-10-22 MED ORDER — AMIODARONE LOAD VIA INFUSION
150.0000 mg | Freq: Once | INTRAVENOUS | Status: AC
  Administered 2023-10-22: 150 mg via INTRAVENOUS
  Filled 2023-10-22: qty 83.34

## 2023-10-22 MED ORDER — SODIUM CHLORIDE 0.9 % IV SOLN
100.0000 mg | Freq: Two times a day (BID) | INTRAVENOUS | Status: AC
  Administered 2023-10-22 – 2023-10-24 (×5): 100 mg via INTRAVENOUS
  Filled 2023-10-22 (×5): qty 100

## 2023-10-22 MED ORDER — FUROSEMIDE 10 MG/ML IJ SOLN
40.0000 mg | Freq: Once | INTRAMUSCULAR | Status: AC
  Administered 2023-10-23: 40 mg via INTRAVENOUS
  Filled 2023-10-22: qty 4

## 2023-10-22 NOTE — Progress Notes (Signed)
 eLink Physician-Brief Progress Note Patient Name: ZAHI PLASKETT DOB: [birth year] MRN: [id number]   Date of Service  10/22/2023  HPI/Events of Note  77 altered, hypothermic and hypotensive from urosepsis, RV failure, encephalopathy  Has had a rise in CVP 8-18, correlates with increasing vasopressor requirements.  Currently on norepinephrine at 10 mcg and vasopressin.  Net -300 for the day so far, responded appropriately to Lasix 40 mg dose.  eICU Interventions  Repeat Lasix x 1.     Intervention Category Intermediate Interventions: Hypotension - evaluation and management  Tramain Gershman 10/22/2023, 11:34 PM

## 2023-10-22 NOTE — Plan of Care (Signed)
  Problem: Education: Goal: Knowledge of General Education information will improve Description: Including pain rating scale, medication(s)/side effects and non-pharmacologic comfort measures Outcome: Progressing   Problem: Coping: Goal: Level of anxiety will decrease Outcome: Not Progressing  Wife at bedside.

## 2023-10-22 NOTE — Progress Notes (Signed)
 In PSV went into Afib RVR  Back in full support now   Will give amio bolus. Bad overall candidate for [facility] with his fq falls; and in the acute setting he recently had severe hematuria (which is improving).  Will defer systemic AC for now, if we cannot convert or achieve rate control will re-eval acute systemic AC   BMP still pending    Tessie Fass MSN, AGACNP-BC [facility] Pulmonary/Critical Care Medicine 10/22/2023, 9:15 AM

## 2023-10-22 NOTE — Progress Notes (Addendum)
 CORTRAK TUBE INSERTION   A 10 F Cortrak tube has been placed. The tube was secured at the R nare at 78 cm by this RN & Ulis Rias RN.  Cathlean Sauer is required, abdominal x-ray has been ordered. Please confirm tube placement before using the Cortrak tube.    If the tube becomes dislodged please keep the tube and contact the ICU charge at [phone].  Gayland Curry, Rapid Response RN  10/22/2023 4:29 PM

## 2023-10-22 NOTE — Progress Notes (Signed)
 Daily Progress Note   Patient Name: Garrett Ferguson       Date: 10/22/2023 DOB: May 23, 1946  Age: 78 y.o. MRN#: [id number] Attending Physician: Tomma Lightning, MD Primary Care Physician: Care, [place] Primary Admit Date: 10/18/2023  Reason for Consultation/Follow-up: Establishing goals of care  Subjective: remains intubated, did not tolerate SBT trial, A fib with RVR, discussed with bedside RN colleague, remains on pressors.   Length of Stay: 4  Current Medications: Scheduled Meds:   Chlorhexidine Gluconate Cloth  6 each Topical Daily   docusate  100 mg Per Tube BID   feeding supplement (PROSource TF20)  60 mL Per Tube Daily   furosemide  40 mg Intravenous Daily   heparin  5,000 Units Subcutaneous Q8H   insulin aspart  0-9 Units Subcutaneous Q4H   multivitamin  15 mL Per Tube Daily   mouth rinse  15 mL Mouth Rinse Q2H   oxyCODONE  5 mg Per Tube Q8H   pantoprazole (PROTONIX) IV  40 mg Intravenous Q24H   polyethylene glycol  17 g Per Tube Daily    Continuous Infusions:  amiodarone 60 mg/hr (10/22/23 1000)   Followed by   amiodarone     cefTRIAXone (ROCEPHIN)  IV Stopped (10/22/23 1011)   doxycycline (VIBRAMYCIN) IV     feeding supplement (VITAL 1.5 CAL) Stopped (10/22/23 0700)   fentaNYL infusion INTRAVENOUS 75 mcg/hr (10/22/23 0945)   norepinephrine (LEVOPHED) Adult infusion 2 mcg/min (10/22/23 1118)   potassium chloride 10 mEq (10/22/23 1151)   vasopressin Stopped (10/20/23 0827)    PRN Meds: acetaminophen **OR** acetaminophen, bisacodyl, fentaNYL, ipratropium, levalbuterol  Physical Exam         Intubated sedated Chronically ill-appearing Patient on mechanical ventilation endotracheal tube Monitor noted Has chronic lymphedema, bilateral lower extremity  edema  Vital Signs: BP (!) 93/43   Pulse 73   Temp 99.7 F (37.6 C) (Axillary)   Resp 16   Ht 5\' 11"  (1.803 m)   Wt 100 kg   SpO2 97%   BMI 30.75 kg/m  SpO2: SpO2: 97 % O2 Device: O2 Device: Ventilator O2 Flow Rate:    Intake/output summary:  Intake/Output Summary (Last 24 hours) at 10/22/2023 1209 Last data filed at 10/22/2023 1000 Gross per 24 hour  Intake 1618.59 ml  Output 2700 ml  Net -1081.41 ml  LBM: Last BM Date :  (PTA) Baseline Weight: Weight: 103 kg Most recent weight: Weight: 100 kg       Palliative Assessment/Data:      Patient Active Problem List   Diagnosis Date Noted   Hypotensive episode 10/18/2023   History of bladder cancer 10/18/2023   Lymphedema 10/18/2023   Hypoalbuminemia 10/18/2023   Septic shock (HCC) 10/18/2023   Acute hypoxic respiratory failure (HCC) 10/18/2023   Severe sepsis (HCC) 03/22/2023   UTI (urinary tract infection) 03/21/2023   Acute encephalopathy 03/21/2023   Hypertension    Dysrhythmia    Restless legs syndrome (RLS) 08/08/2013   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  Palliative following for CODE STATUS and broad goals of care discussions.  Recommendations/Plan:  DNR -no CPR as per patient's previously expressed wishes.  Time-limited trial of current interventions, monitor SBT trials.  Attempted to call patient's wife on the phone, went straight to voicemail: PMT will re attempt on 10-23-23.  As per discussions with  her on 10-21-23:  For now, she wishes for continuation of current mode  of care.  Offered support, palliative will continue to follow along.    Code Status:    Code Status Orders  (From admission, onward)           Start     Ordered   10/21/23 1432  Do not attempt resuscitation (DNR) Pre-Arrest Interventions Desired  (Code Status)  Continuous       Question Answer Comment  If pulseless and not breathing No CPR or chest compressions.   In Pre-Arrest Conditions (Patient Has Pulse and Is Breathing) May intubate, use advanced airway interventions and cardioversion/ACLS medications if appropriate or indicated. May transfer to ICU.   Consent: Discussion documented in EHR or advanced directives reviewed      10/21/23 1431           Code Status History     Date Active Date Inactive Code Status Order ID Comments User Context   10/18/2023 2039 10/21/2023 1431 Full Code [id number]  Rozann Lesches, MD Inpatient   10/18/2023 1444 10/18/2023 2039 Full Code [id number]  Kendell Bane, MD ED   03/21/2023 2331 03/27/2023 1928 Full Code [id number]  Briscoe Deutscher, MD ED   04/23/2012 1506 04/26/2012 1500 Full Code [id number]  George Hugh, RN Inpatient       Prognosis:  Unable to determine  Discharge Planning: To Be Determined  Care plan was discussed with  IDT   Thank you for allowing the Palliative Medicine Team to assist in the care of this patient. mod MDM.      Greater than 50%  of this time was spent counseling and coordinating care related to the above assessment and plan.  Rosalin Hawking, MD  Please contact Palliative Medicine Team phone at [phone] for questions and concerns.

## 2023-10-22 NOTE — TOC Initial Note (Signed)
 Transition of Care [facility]) - Initial/Assessment Note    Patient Details  Name: Garrett Ferguson MRN: [id number] Date of Birth: 1946-05-08  [facility] CM/SW Contact:    Diona Browner, LCSW Phone Number: 10/22/2023, 1:10 PM  Clinical Narrative:                 Pt from home. Pt continued medical workup. TOC following for d/c needs.     Barriers to Discharge: Continued Medical Work up   Patient Goals and CMS Choice Patient states their goals for this hospitalization and ongoing recovery are:: return home   Choice offered to / list presented to : NA [place] ownership interest in [facility].provided to::  (NA)    Expected Discharge Plan and Services In-house Referral: NA Discharge Planning Services: NA   Living arrangements for the past 2 months: Single Family Home                   DME Agency: NA       HH Arranged: NA          Prior Living Arrangements/Services Living arrangements for the past 2 months: Single Family Home Lives with:: Self Patient language and need for interpreter reviewed:: Yes Do you feel safe going back to the place where you live?: Yes      Need for Family Participation in Patient Care: Yes (Comment) Care giver support system in place?: Yes (comment)   Criminal Activity/Legal Involvement Pertinent to Current Situation/Hospitalization: No - Comment as needed  Activities of Daily Living      Permission Sought/Granted                  Emotional Assessment Appearance:: Appears stated age     Orientation: : Fluctuating Orientation (Suspected and/or reported Sundowners) Alcohol / Substance Use: Not Applicable Psych Involvement: No (comment)  Admission diagnosis:  Sepsis (HCC) [A41.9] Septic shock (HCC) [A41.9, R65.21] Acute hypoxic respiratory failure (HCC) [J96.01] Patient Active Problem List   Diagnosis Date Noted   Hypotensive episode 10/18/2023   History of bladder cancer 10/18/2023   Lymphedema  10/18/2023   Hypoalbuminemia 10/18/2023   Septic shock (HCC) 10/18/2023   Acute hypoxic respiratory failure (HCC) 10/18/2023   Severe sepsis (HCC) 03/22/2023   UTI (urinary tract infection) 03/21/2023   Acute encephalopathy 03/21/2023   Hypertension    Dysrhythmia    Restless legs syndrome (RLS) 08/08/2013   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   PCP:  Care, [place] Primary Pharmacy:   [id number] - [place], [place] - [address] [place] [address] Phone: [phone] Fax: [phone]     Social Drivers of Health (SDOH) Social History: SDOH Screenings   Food Insecurity: Patient Unable To Answer (10/21/2023)  Housing: Patient Unable To Answer (10/21/2023)  Transportation Needs: Patient Unable To Answer (10/21/2023)  Utilities: Patient Unable To Answer (10/21/2023)  Financial Resource Strain: Medium Risk (04/06/2021)   Received from [facility], [facility]  Physical Activity: Insufficiently Active (02/23/2021)   Received from [facility], [facility]  Social Connections: Patient Unable To Answer (10/21/2023)  Stress: No Stress Concern Present (02/23/2021)   Received from [facility], [facility]  Tobacco Use: Low Risk  (10/18/2023)  Health Literacy: Low Risk  (02/23/2021)   Received from [facility], [facility]   SDOH Interventions:     Readmission Risk Interventions    10/22/2023  1:09 PM  Readmission Risk Prevention Plan  Transportation Screening Complete  PCP or Specialist Appt within 5-7 Days Complete  Home Care Screening Complete  Medication Review (RN CM) Complete

## 2023-10-22 NOTE — Progress Notes (Addendum)
 NAME:  Garrett Ferguson, MRN:  [id number], DOB:  1946/03/04, LOS: 4 ADMISSION DATE:  10/18/2023, CONSULTATION DATE:  10/18/23 REFERRING MD:  Dr. Flossie Dibble, CHIEF COMPLAINT:  AMS   Brief HPI:  78 y/o male with PMH for Bladder cancer, HTN, RLS, Chronic Lymphedema LE, PAD, CKD, GERD who had a fall at home (had fall night before as well but EMS checked his vitals and did not find him to a candidate for hospital), BIBA to [facility].  Verbal at home but then had a seizure.  Seizure lasting about 2 min per EMS, BG 109.  EMS placed in on 100% NRM and in the ED he had a nasal trumpet placed.  He was confused and agitated.  He was hypothermic 32.4 and Hypotensive 78/50 LA 2.0.  RVR, Influenza A and Covid negative.  UA positive for cloudy and increased WBC.  Sepsis protocol started.  He was transferred to [facility] ICU.  He presented unresponsive no response to pain/sternal rubs.  Presented with Albumin and Levophed running.  He did not have a aga either.  Code called for respiratory arrest and Dr Jarold Motto intubated the patient.  No loss of pulse, no CPR done.   Pertinent  Medical History  HTN, bladder cancer, urethral stricture s/p dilation 2022 with ongoing incomplete emptying/ LUTS CKD, HLD, PAD, GERD, restless leg, chronic lymphedema, osteoarthritis, chronic pain (left knee after multiple surgeries, followed by [facility] pain management- on gabapentin, oxycodone, Xtampza), anxiety/ depression, PUD, basal cell carcinoma, anisocoria  Significant Hospital Events: Including procedures, antibiotic start and stop dates in addition to other pertinent events   3/6 tx from [facility] ER to [facility], urosepsis, AMS, intubated on arrival  3/7 difficulty placing foley, ended up having trauma and urology was consulted 3/9 Off of pressors. Documented SBT x 9 hours. made DNR w palliative care.  3/10 remains off NE   Interim History / Subjective:   Labs 3/10 AM are pending    Objective   Blood pressure (!) 178/94, pulse 95, temperature 99.6 F  (37.6 C), temperature source Axillary, resp. rate 19, height 5\' 11"  (1.803 m), weight 100 kg, SpO2 100%. CVP:  [8 mmHg-14 mmHg] 10 mmHg  Vent Mode: PSV;CPAP FiO2 (%):  [30 %] 30 % Set Rate:  [15 bmp] 15 bmp Vt Set:  [600 mL] 600 mL PEEP:  [5 cmH20] 5 cmH20 Pressure Support:  [8 cmH20-10 cmH20] 10 cmH20 Plateau Pressure:  [17 cmH20] 17 cmH20   Intake/Output Summary (Last 24 hours) at 10/22/2023 0832 Last data filed at 10/22/2023 0655 Gross per 24 hour  Intake 1569.27 ml  Output 2300 ml  Net -730.73 ml   Filed Weights   10/19/23 0500 10/20/23 0453 10/21/23 0500  Weight: 105.3 kg 102.3 kg 100 kg    Examination: Gen:  Critically ill elderly M NAD  Neuro:  Lightly sedated. Lethargic  following commands, Generalized weakness, maybe a little LUE weakness  HEENT:  NCAT ETT secure  Lungs:   CTAb on MV  CV:   rrr cap refill brisk  Abd: round ndnt  Ext: BLE edema  Skin:   BLE erythema    Resolved Hospital Problem list   Thrombocytopenia  Assessment & Plan:   Acute metabolic encephalopathy  -improving -RASS goal 0 to -1; wean fent  -delirium precautions   Acute hypoxic resp failure  L>R PNA  -intubated 3/6  -WUA/SBT  -MRSA is neg. Will change to doxy & will dc flagyl, for ongoing abx coverage of rocephin + doxy, for  7d course   Septic shock 2/2 PNA vs cellulitis -- improved shock  RV failure -add 40 Iv lasix 3/10  -has been off pressors x 24hrs  -MRSA neg, dc vanc. Narrow to just rocephin.   Hematuria  r/t traumatic foley placement  -foley placed 3/7  - seen by uro, rec cont foley x at least 7d prior to void trial   Prolonged qtc  Hx HTN HLD  -optimize lytes -- BMP is pending 3/10; based on latest trend + knowing that we are diuresing, willgo ahead and order for 40 mEq K IV. Can eval for add'l vs dc some of these runs when BMP results  -as below, will restart statin when we have enteral access. Also restart home ASA -will start w diurese; if tolerates will add back  other home antihypertensives now that we are off pressors  Elevated LFTs, mild  -really mild. Statin was held but I think when we have enteral access re-est its fine to resume without a recheck  Thrombocytopenia Chronic normocytic anemia  -PRN CBC   RLS Chronic pain Depression - hold pta pramipexole, oxy IR, oxy ER, gabapentin  - hold wellbutrin, sertraline   Physical deconditioning Fq falls  Chronic lymphedema  - PT/ OT when appropriate. Sounds like he is mostly wheelchair bound at baseline   GOC DNR  - appreciate palliative care following   Best Practice (right click and "Reselect all SmartList Selections" daily)   Diet/type: NPO -- EN + meds per tube when enteral access re-est  DVT prophylaxis prophylactic heparin  Pressure ulcer(s): N/A GI prophylaxis: PPI Lines: Central line Foley:  Yes, and it is still needed x 7d at least  Code Status:  DNR Last date of multidisciplinary goals of care discussion  3/9 -- palliative is following  Labs   CBC: Recent Labs  Lab 10/18/23 1250 10/19/23 0504 10/19/23 1804 10/20/23 0435 10/21/23 0515  WBC 4.0 7.6  --  6.7 7.4  NEUTROABS 3.0 6.2  --   --   --   HGB 11.6* 10.1* 10.5* 10.2* 10.0*  HCT 37.0* 32.6* 33.7* 32.8* 32.3*  MCV 91.8 95.9  --  94.5 94.7  PLT 149* 157  --  105* 110*    Basic Metabolic Panel: Recent Labs  Lab 10/18/23 1250 10/19/23 0504 10/19/23 1456 10/20/23 0435 10/21/23 0515  NA 139 142 133* 134* 135  K 3.2* 4.0 3.5 3.9 3.8  CL 109 109 104 102 102  CO2 25 24 21* 24 25  GLUCOSE 112* 99 330* 190* 95  BUN 25* 24* 24* 20 21  CREATININE 0.59* 0.80 0.87 0.89 0.85  CALCIUM 8.5* 7.6* 7.5* 7.8* 8.2*  MG 2.0 1.7  --  1.8  --   PHOS 3.4 2.8  --  2.9  --    GFR: Estimated Creatinine Clearance: 86.3 mL/min (by C-G formula based on SCr of 0.85 mg/dL). Recent Labs  Lab 10/18/23 1250 10/18/23 1435 10/18/23 1542 10/19/23 0504 10/20/23 0435 10/21/23 0515  PROCALCITON  --   --  <0.10  --   --   --    WBC 4.0  --   --  7.6 6.7 7.4  LATICACIDVEN 2.0* 0.9 1.1  --   --   --     Liver Function Tests: Recent Labs  Lab 10/18/23 1250 10/19/23 0839  AST 57* 45*  ALT 45* 38  ALKPHOS 97 73  BILITOT 0.6 0.9  PROT 6.8 5.7*  ALBUMIN 3.3* 2.7*   No results for input(s): "  LIPASE", "AMYLASE" in the last 168 hours. No results for input(s): "AMMONIA" in the last 168 hours.  ABG    Component Value Date/Time   PHART 7.38 10/19/2023 0411   PCO2ART 42 10/19/2023 0411   PO2ART 63 (L) 10/19/2023 0411   HCO3 24.2 10/19/2023 0411   ACIDBASEDEF 0.7 10/19/2023 0411   O2SAT 80 10/19/2023 1456     Coagulation Profile: Recent Labs  Lab 10/18/23 1250 10/19/23 0504  INR 1.1 1.3*    Cardiac Enzymes: No results for input(s): "CKTOTAL", "CKMB", "CKMBINDEX", "TROPONINI" in the last 168 hours.  HbA1C: Hgb A1c MFr Bld  Date/Time Value Ref Range Status  10/19/2023 08:39 AM 5.8 (H) 4.8 - 5.6 % Final    Comment:    (NOTE) Pre diabetes:          5.7%-6.4%  Diabetes:              >6.4%  Glycemic control for   <7.0% adults with diabetes     CBG: Recent Labs  Lab 10/21/23 1610 10/21/23 1948 10/21/23 2357 10/22/23 0415 10/22/23 0801  GLUCAP 86 94 112* 82 74     CRITICAL CARE Performed by: Lanier Clam   Total critical care time: 37 minutes  Critical care time was exclusive of separately billable procedures and treating other patients.  Critical care was necessary to treat or prevent imminent or life-threatening deterioration.  Critical care was time spent personally by me on the following activities: development of treatment plan with patient and/or surrogate as well as nursing, discussions with consultants, evaluation of patient's response to treatment, examination of patient, obtaining history from patient or surrogate, ordering and performing treatments and interventions, ordering and review of laboratory studies, ordering and review of radiographic studies, pulse oximetry  and re-evaluation of patient's condition.    Tessie Fass MSN, AGACNP-BC [facility] Pulmonary/Critical Care Medicine Amion for pager  10/22/2023, 8:32 AM

## 2023-10-23 DIAGNOSIS — N3000 Acute cystitis without hematuria: Secondary | ICD-10-CM

## 2023-10-23 DIAGNOSIS — Z8551 Personal history of malignant neoplasm of bladder: Secondary | ICD-10-CM

## 2023-10-23 DIAGNOSIS — J9601 Acute respiratory failure with hypoxia: Secondary | ICD-10-CM | POA: Diagnosis not present

## 2023-10-23 DIAGNOSIS — I4891 Unspecified atrial fibrillation: Secondary | ICD-10-CM | POA: Diagnosis not present

## 2023-10-23 DIAGNOSIS — A419 Sepsis, unspecified organism: Secondary | ICD-10-CM | POA: Diagnosis not present

## 2023-10-23 DIAGNOSIS — G9341 Metabolic encephalopathy: Secondary | ICD-10-CM | POA: Diagnosis not present

## 2023-10-23 DIAGNOSIS — Z515 Encounter for palliative care: Secondary | ICD-10-CM | POA: Diagnosis not present

## 2023-10-23 DIAGNOSIS — R4589 Other symptoms and signs involving emotional state: Secondary | ICD-10-CM

## 2023-10-23 DIAGNOSIS — Z7189 Other specified counseling: Secondary | ICD-10-CM

## 2023-10-23 LAB — GLUCOSE, CAPILLARY
Glucose-Capillary: 118 mg/dL — ABNORMAL HIGH (ref 70–99)
Glucose-Capillary: 129 mg/dL — ABNORMAL HIGH (ref 70–99)
Glucose-Capillary: 135 mg/dL — ABNORMAL HIGH (ref 70–99)
Glucose-Capillary: 138 mg/dL — ABNORMAL HIGH (ref 70–99)
Glucose-Capillary: 156 mg/dL — ABNORMAL HIGH (ref 70–99)
Glucose-Capillary: 158 mg/dL — ABNORMAL HIGH (ref 70–99)

## 2023-10-23 LAB — CBC
HCT: 30.2 % — ABNORMAL LOW (ref 39.0–52.0)
Hemoglobin: 9.1 g/dL — ABNORMAL LOW (ref 13.0–17.0)
MCH: 29.1 pg (ref 26.0–34.0)
MCHC: 30.1 g/dL (ref 30.0–36.0)
MCV: 96.5 fL (ref 80.0–100.0)
Platelets: 149 10*3/uL — ABNORMAL LOW (ref 150–400)
RBC: 3.13 MIL/uL — ABNORMAL LOW (ref 4.22–5.81)
RDW: 15.5 % (ref 11.5–15.5)
WBC: 8.2 10*3/uL (ref 4.0–10.5)
nRBC: 0 % (ref 0.0–0.2)

## 2023-10-23 LAB — BASIC METABOLIC PANEL
Anion gap: 4 — ABNORMAL LOW (ref 5–15)
BUN: 33 mg/dL — ABNORMAL HIGH (ref 8–23)
CO2: 24 mmol/L (ref 22–32)
Calcium: 7.4 mg/dL — ABNORMAL LOW (ref 8.9–10.3)
Chloride: 104 mmol/L (ref 98–111)
Creatinine, Ser: 0.9 mg/dL (ref 0.61–1.24)
GFR, Estimated: >60 mL/min (ref >60)
Glucose, Bld: 176 mg/dL — ABNORMAL HIGH (ref 70–99)
Potassium: 4.1 mmol/L (ref 3.5–5.1)
Sodium: 132 mmol/L — ABNORMAL LOW (ref 135–145)

## 2023-10-23 LAB — CULTURE, BLOOD (ROUTINE X 2)
Culture: NO GROWTH
Culture: NO GROWTH
Culture: NO GROWTH
Culture: NO GROWTH
Special Requests: ADEQUATE

## 2023-10-23 LAB — COOXEMETRY PANEL
Carboxyhemoglobin: 2.3 % — ABNORMAL HIGH (ref 0.5–1.5)
Methemoglobin: <0.7 % (ref 0.0–1.5)
O2 Saturation: 99.7 %
Total hemoglobin: 10.9 g/dL — ABNORMAL LOW (ref 12.0–16.0)

## 2023-10-23 MED ORDER — HYALURONIDASE HUMAN 150 UNIT/ML IJ SOLN
150.0000 [IU] | Freq: Once | INTRAMUSCULAR | Status: AC
  Administered 2023-10-23: 150 [IU] via SUBCUTANEOUS
  Filled 2023-10-23 (×2): qty 1

## 2023-10-23 MED ORDER — ATORVASTATIN CALCIUM 10 MG PO TABS
10.0000 mg | ORAL_TABLET | Freq: Every day | ORAL | Status: DC
  Administered 2023-10-23 – 2023-10-26 (×4): 10 mg
  Filled 2023-10-23 (×4): qty 1

## 2023-10-23 MED ORDER — FENTANYL 2500MCG IN NS 250ML (10MCG/ML) PREMIX INFUSION
0.0000 ug/h | INTRAVENOUS | Status: DC
  Administered 2023-10-23: 100 ug/h via INTRAVENOUS
  Filled 2023-10-23: qty 250

## 2023-10-23 MED ORDER — STERILE WATER FOR INJECTION IJ SOLN
INTRAMUSCULAR | Status: AC
  Administered 2023-10-23: 10 mL
  Filled 2023-10-23: qty 10

## 2023-10-23 MED ORDER — ASPIRIN 81 MG PO CHEW
81.0000 mg | CHEWABLE_TABLET | Freq: Every day | ORAL | Status: DC
Start: 2023-10-24 — End: 2023-10-26
  Administered 2023-10-24 – 2023-10-26 (×3): 81 mg
  Filled 2023-10-23 (×3): qty 1

## 2023-10-23 NOTE — Progress Notes (Signed)
 Chaplain visited at request of chaplain team, pt was alert with his wife, Garrett Ferguson and son Garrett Ferguson present. Pt was alert and squeezing [name]'s hand, though he could not speak because of being intubated. [name] were greatly encouraged by pt's progress today. 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 Family has the prayer support of many and their faith is very important to them. Chaplain provided reflective listening, prayer and orientation to chaplain services. Additional support available upon request.  Chaplain [name] Div   10/23/23 1300  Spiritual Encounters  Type of Visit Initial  Care provided to: Patient;Family  Referral source Chaplain team  Reason for visit Routine spiritual support  OnCall Visit No  Spiritual Framework  Presenting Themes Meaning/purpose/sources of inspiration;Impactful experiences and emotions  Patient Stress Factors Health changes  Interventions  Spiritual Care Interventions Made Established relationship of care and support;Compassionate presence;Reflective listening;Prayer  Intervention Outcomes  Outcomes Connection to spiritual care  Spiritual Care Plan  Spiritual Care Issues Still Outstanding No further spiritual care needs at this time (see row info)

## 2023-10-23 NOTE — Progress Notes (Signed)
 NAME:  Garrett Ferguson, MRN:  [id number], DOB:  [birth year], LOS: 5 ADMISSION DATE:  10/18/2023, CONSULTATION DATE:  10/18/23 REFERRING MD:  Dr. Flossie Dibble, CHIEF COMPLAINT:  AMS   Brief HPI:  78 y/o male with PMH for Bladder cancer, HTN, RLS, Chronic Lymphedema LE, PAD, CKD, GERD who had a fall at home (had fall night before as well but EMS checked his vitals and did not find him to a candidate for hospital), BIBA to [facility].  Verbal at home but then had a seizure.  Seizure lasting about 2 min per EMS, BG 109.  EMS placed in on 100% NRM and in the ED he had a nasal trumpet placed.  He was confused and agitated.  He was hypothermic 32.4 and Hypotensive 78/50 LA 2.0.  RVR, Influenza A and Covid negative.  UA positive for cloudy and increased WBC.  Sepsis protocol started.  He was transferred to [facility] ICU.  He presented unresponsive no response to pain/sternal rubs.  Presented with Albumin and Levophed running.  He did not have a aga either.  Code called for respiratory arrest and Dr Jarold Motto intubated the patient.  No loss of pulse, no CPR done.   Pertinent  Medical History  HTN, bladder cancer, urethral stricture s/p dilation 2022 with ongoing incomplete emptying/ LUTS CKD, HLD, PAD, GERD, restless leg, chronic lymphedema, osteoarthritis, chronic pain (left knee after multiple surgeries, followed by [facility] pain management- on gabapentin, oxycodone, Xtampza), anxiety/ depression, PUD, basal cell carcinoma, anisocoria  Significant Hospital Events: Including procedures, antibiotic start and stop dates in addition to other pertinent events   3/6 tx from [facility] ER to [facility], urosepsis, AMS, intubated on arrival  3/7 difficulty placing foley, ended up having trauma and urology was consulted 3/9 Off of pressors. Documented SBT x 9 hours. made DNR w palliative care.  3/10 Fib RVR w SBT; back to full support, NSR w amio gtt. Abx to doxy rocephin  3/11 fever, ongoing pressors   Interim History / Subjective:   Add'l  lasix overnight    Objective   Blood pressure (!) 107/34, pulse (!) 53, temperature 98.8 F (37.1 C), temperature source Axillary, resp. rate 15, height 5\' 11"  (1.803 m), weight 97.7 kg, SpO2 98%. CVP:  [7 mmHg-22 mmHg] 14 mmHg  Vent Mode: PRVC FiO2 (%):  [30 %] 30 % Set Rate:  [15 bmp] 15 bmp Vt Set:  [600 mL] 600 mL PEEP:  [5 cmH20] 5 cmH20 Plateau Pressure:  [12 cmH20-20 cmH20] 20 cmH20   Intake/Output Summary (Last 24 hours) at 10/23/2023 1010 Last data filed at 10/23/2023 0800 Gross per 24 hour  Intake 2043.41 ml  Output 1850 ml  Net 193.41 ml   Filed Weights   10/20/23 0453 10/21/23 0500 10/22/23 0701  Weight: 102.3 kg 100 kg 97.7 kg    Examination: Gen: Critically ill elderly M NAD  Neuro: awake following commands, generalized weakness  HEENT:  NCAT pink mm ETT secure  Lungs:   Mechanically ventilated, clear  CV:   rrrr s1s2  Abd: soft round  GU: foley yellow urine  Ext: No acute joint deformity, BLE edema  Skin:   erythematous lower ext    Resolved Hospital Problem list   Thrombocytopenia  Assessment & Plan:   Acute metabolic encephalopathy -wean sedation as able -delirium precautions  Acute hypoxic resp failure CAP L>R  Septic shock 2/2 PNA vs cellulitis  -doxy rocephin to complete 7d abx course -NE for MAP > 65  -VAP, pulm hygiene  -  WUA/SBT   RV failure Acute onset Afib RVR -- NSR Prolonged qtc  HLD Hx HTN  -lasix as able  -coox  -NE as above -amio, no AC.  -optimize lytes -restart statin, ASA 3/11  Hematuria; urethral trauma  -foley x 7d at least   Thrombocytopenia Anemia  -PRN CBC   RLS Chronic pain Anxiety / depression  -supportive care   Chronic lymphedema  -supportive care  -diuresing   Fq falls Physical deconditioning  -PT when appropriate. GOC ongoing as below   GOC DNR  -palliative following   Best Practice (right click and "Reselect all SmartList Selections" daily)   Diet/type: NPO -- EN + meds per tube when  enteral access re-est  DVT prophylaxis prophylactic heparin  Pressure ulcer(s): N/A GI prophylaxis: PPI Lines: Central line Foley:  Yes, and it is still needed x 7d at least  Code Status:  DNR Last date of multidisciplinary goals of care discussion  3/10 -- palliative is following  Labs   CBC: Recent Labs  Lab 10/18/23 1250 10/19/23 0504 10/19/23 1804 10/20/23 0435 10/21/23 0515 10/22/23 0848 10/23/23 0550  WBC 4.0 7.6  --  6.7 7.4 8.2 8.2  NEUTROABS 3.0 6.2  --   --   --   --   --   HGB 11.6* 10.1* 10.5* 10.2* 10.0* 11.2* 9.1*  HCT 37.0* 32.6* 33.7* 32.8* 32.3* 37.0* 30.2*  MCV 91.8 95.9  --  94.5 94.7 97.1 96.5  PLT 149* 157  --  105* 110* 124* 149*    Basic Metabolic Panel: Recent Labs  Lab 10/18/23 1250 10/19/23 0504 10/19/23 1456 10/20/23 0435 10/21/23 0515 10/22/23 0848 10/23/23 0550  NA 139 142 133* 134* 135 137 132*  K 3.2* 4.0 3.5 3.9 3.8 4.3 4.1  CL 109 109 104 102 102 102 104  CO2 25 24 21* 24 25 26 24   GLUCOSE 112* 99 330* 190* 95 89 176*  BUN 25* 24* 24* 20 21 23  33*  CREATININE 0.59* 0.80 0.87 0.89 0.85 0.64 0.90  CALCIUM 8.5* 7.6* 7.5* 7.8* 8.2* 8.6* 7.4*  MG 2.0 1.7  --  1.8  --   --   --   PHOS 3.4 2.8  --  2.9  --   --   --    GFR: Estimated Creatinine Clearance: 80.7 mL/min (by C-G formula based on SCr of 0.9 mg/dL). Recent Labs  Lab 10/18/23 1250 10/18/23 1435 10/18/23 1542 10/19/23 0504 10/20/23 0435 10/21/23 0515 10/22/23 0848 10/23/23 0550  PROCALCITON  --   --  <0.10  --   --   --   --   --   WBC 4.0  --   --    < > 6.7 7.4 8.2 8.2  LATICACIDVEN 2.0* 0.9 1.1  --   --   --   --   --    < > = values in this interval not displayed.    Liver Function Tests: Recent Labs  Lab 10/18/23 1250 10/19/23 0839  AST 57* 45*  ALT 45* 38  ALKPHOS 97 73  BILITOT 0.6 0.9  PROT 6.8 5.7*  ALBUMIN 3.3* 2.7*   No results for input(s): "LIPASE", "AMYLASE" in the last 168 hours. No results for input(s): "AMMONIA" in the last 168  hours.  ABG    Component Value Date/Time   PHART 7.38 10/19/2023 0411   PCO2ART 42 10/19/2023 0411   PO2ART 63 (L) 10/19/2023 0411   HCO3 24.2 10/19/2023 0411   ACIDBASEDEF 0.7 10/19/2023  0411   O2SAT 99.7 10/23/2023 0825     Coagulation Profile: Recent Labs  Lab 10/18/23 1250 10/19/23 0504  INR 1.1 1.3*    Cardiac Enzymes: No results for input(s): "CKTOTAL", "CKMB", "CKMBINDEX", "TROPONINI" in the last 168 hours.  HbA1C: Hgb A1c MFr Bld  Date/Time Value Ref Range Status  10/19/2023 08:39 AM 5.8 (H) 4.8 - 5.6 % Final    Comment:    (NOTE) Pre diabetes:          5.7%-6.4%  Diabetes:              >6.4%  Glycemic control for   <7.0% adults with diabetes     CBG: Recent Labs  Lab 10/22/23 1613 10/22/23 2006 10/23/23 0018 10/23/23 0320 10/23/23 0757  GLUCAP 104* 151* 158* 156* 135*    CRITICAL CARE Performed by: Lanier Clam   Total critical care time: 37 minutes  Critical care time was exclusive of separately billable procedures and treating other patients. Critical care was necessary to treat or prevent imminent or life-threatening deterioration.  Critical care was time spent personally by me on the following activities: development of treatment plan with patient and/or surrogate as well as nursing, discussions with consultants, evaluation of patient's response to treatment, examination of patient, obtaining history from patient or surrogate, ordering and performing treatments and interventions, ordering and review of laboratory studies, ordering and review of radiographic studies, pulse oximetry and re-evaluation of patient's condition.  Tessie Fass MSN, AGACNP-BC [facility] Pulmonary/Critical Care Medicine Amion for pager 10/23/2023, 10:10 AM

## 2023-10-23 NOTE — Progress Notes (Signed)
 Daily Progress Note   Patient Name: Garrett Ferguson       Date: 10/23/2023 DOB: 08-09-1946  Age: 78 y.o. MRN#: [id number] Attending Physician: Tomma Lightning, MD Primary Care Physician: Care, [place] Primary Admit Date: 10/18/2023 Length of Stay: 5 days  Reason for Consultation/Follow-up: Establishing goals of care  Subjective:   CC: Patient awake on ventilator support; family at bedside.  Following up regarding complex medical decision making.  Subjective:  Extensive review of EMR.  Patient did not tolerate spontaneous breathing trial yesterday.  Patient developed worsening RVR. Discussed care with PCCM provider and RN for medical updates.  Maintaining control of RVR while attempting to diurese patient further to optimize chances of tolerating spontaneous breathing trial.  When presenting to bedside to examine medications which noted patient was still on amiodarone and Levophed.  Presented to bedside to meet with patient.  Patient laying in bed intubated.  Patient will open his eyes and appropriately interactive at times to nod head yes and no.  Patient is overall tired though and wanting to rest at times.  Patient's wife and son at bedside.  Able to introduce myself as a member of the palliative medicine team.  Wife noted that she is happy to see patient's "improvement" in mentation.  She knows the plan to optimize medically before further attempting spontaneous breathing trial.  Acknowledged hopes regarding this.   With permission, did express importance of discussions regarding hoping for the best while planning for the worst.  Acknowledged hope that patient will be able to get off the ventilator and stay off the ventilator.  Encouraged further discussions, especially when patient alert, about if he did not do well once extubated and if he would want to be reintubated knowing that would lead to likely trach placement and long-term care support with ventilator.  Wife acknowledged need to have  this discussion though admitting she is overwhelmed.  Spent time providing emotional support reactive listening.  Wife asked to speak outside to this provider.  Wife mentioned that her friend at [facility] hosp[facility] had called about having a member of the palliative branch reach out to provide support for her.  During this conversation, [facility] liaison joined [place].  Noted that patient will continue receiving discussions and support from inpatient palliative medicine team in addition to this extra-support.  Spent time providing emotional support reactive listening.  All questions answered at that time.  Noted palliative medicine team will continue to follow with patient's medical journey.  Objective:   Vital Signs:  BP (!) 180/59 Comment: RN was performing foley care; levo gtt titrated down  Pulse 76   Temp 98.8 F (37.1 C) (Axillary)   Resp 19   Ht 5\' 11"  (1.803 m)   Wt 97.7 kg   SpO2 98%   BMI 30.04 kg/m   Physical Exam: General: Intubated, chronically ill-appearing, awake at times appropriately nodding yes and no HENT: ET tube in place Cardiovascular: RRR, edema in the lower extremities bilaterally Respiratory: Intubated on ventilator support Skin: Chronic venous stasis changes in lower extremities bilaterally Neuro: Awake on ventilator support, appropriately nodding head yes and no at times  Imaging: I personally reviewed recent imaging.   Assessment & Plan:   Assessment: Patient is an 47-year-old male with a past medical history of bladder cancer, hypertension, RLS, chronic pain, chronic lymphedema in lower extremities bilaterally, PAD, CKD, and GERD who was transferred from [facility] on 10/18/2023 after a fall at home when was noted had seizure.  Since  admission, patient has received management for acute metabolic encephalopathy, acute hypoxic respiratory failure requiring intubation with mechanical ventilation support secondary to pneumonia, and acute onset A-fib with RVR.  Palliative  medicine team consulted to assist with complex medical decision making.  Recommendations/Plan: # Complex medical decision making/goals of care:  - Patient remains intubated though awake at times and attempting to participate as able.  Continuing appropriate medical interventions to medically optimize and attempt for spontaneous breathing trials.  Discussed care with patient's wife at bedside as detailed above in HPI.  Noted importance of conversations regarding open for the best while planning for the worst.  While hoping patient will continue to improve and be able to be extubated, noted that is important to discuss if patient would want to pursue reintubation if his breathing status does not do well after extubation knowing that this would likely lead to tracheostomy placement for long-term ventilator support.  Palliative medicine team continuing to engage in conversations as able.  -  Code Status: Do not attempt resuscitation (DNR) PRE-ARREST INTERVENTIONS DESIRED  # Psychosocial Support:  -Wife, son  # Discharge Planning: To Be Determined  Discussed with: PCCM, RN, patient, patient's family at bedside including wife  Thank you for allowing the palliative care team to participate in the care Bari Mantis.  Alvester Morin, DO Palliative Care Provider PMT # [phone]  If patient remains symptomatic despite maximum doses, please call PMT at [phone] between 0700 and 1900. Outside of these hours, please call attending, as PMT does not have night coverage.  Personally spent 35 minutes in patient care including extensive chart review (labs, imaging, progress/consult notes, vital signs), medically appropraite exam, discussed with treatment team, education to patient, family, and staff, documenting clinical information, medication review and management, coordination of care, and available advanced directive documents.

## 2023-10-23 NOTE — Progress Notes (Signed)
 Nutrition Follow-up  DOCUMENTATION CODES:   Not applicable  INTERVENTION:  - Continue goal TF regimen via Cortrak: Vital 1.5 @ 60 ml/hr  Prosource TF20 60 ml daily Provides 2240 kcals, 117g protein and 1100 ml H2O  - FWF per CCM/MD.   - Monitor weight trends.   NUTRITION DIAGNOSIS:   Inadequate oral intake related to inability to eat as evidenced by NPO status. *ongoing  GOAL:   Patient will meet greater than or equal to 90% of their needs *met with TF  MONITOR:   Vent status, TF tolerance  REASON FOR ASSESSMENT:   Consult, Ventilator Enteral/tube feeding initiation and management, Assessment of nutrition requirement/status  ASSESSMENT:   78 y.o. with a past medical history of  hypertension, RLS, chronic lymphedema, PAD, CKD, GERD who presented to [facility] last night after suffering a fall.  Report however was that he had a seizure.  He presented the ED agitated hypotensive as well as in respiratory failure.  3/6 Admit; Intubated 3/7 Vital 1.5 initiated @ 20 3/9 Reached goal of Vital 1.5 @ 60  3/10 Cortrak placed (tip in stomach)  Patient is currently intubated on ventilator support MV: 9.7 L/min Temp (24hrs), Avg:100 F (37.8 C), Min:98.8 F (37.1 C), Max:101.3 F (38.5 C)  No family at bedside at time of visit. Patient resting on vent.  TF infusing at goal of 26mL/hr via Cortrak. No noted intolerances. Patient still with no BM since admission.  GOC discussions ongoing.   Admit weight: 227# Current weight: 215# I&O's: +741mL since admit + for mild pitting generalized edema and moderate/deep pitting edema to lower extremities  Medications reviewed and include: Colace, Miralax, MVI Fentanyl Levophed @ 35mcg/min  Labs reviewed:  Na 132 HA1C 5.8   Diet Order:   Diet Order             Diet NPO time specified  Diet effective now                   EDUCATION NEEDS:  Not appropriate for education at this time  Skin:  Skin Assessment:  Reviewed RN Assessment  Last BM:  PTA  Height:  Ht Readings from Last 1 Encounters:  10/18/23 5\' 11"  (1.803 m)   Weight:  Wt Readings from Last 1 Encounters:  10/22/23 97.7 kg    BMI:  Body mass index is 30.04 kg/m.  Estimated Nutritional Needs:  Kcal:  2100-2300 Protein:  120-130g Fluid:  2L/day    Shelle Iron RD, LDN Contact via Secure Chat.

## 2023-10-23 NOTE — Plan of Care (Signed)
  Problem: Clinical Measurements: Goal: Respiratory complications will improve Outcome: Progressing Goal: Cardiovascular complication will be avoided Outcome: Progressing   Problem: Nutrition: Goal: Adequate nutrition will be maintained Outcome: Progressing   Problem: Skin Integrity: Goal: Risk for impaired skin integrity will decrease Outcome: Progressing   Problem: Respiratory: Goal: Ability to maintain a clear airway and adequate ventilation will improve Outcome: Progressing   Problem: Role Relationship: Goal: Method of communication will improve Outcome: Progressing   Problem: Nutritional: Goal: Maintenance of adequate nutrition will improve Outcome: Progressing

## 2023-10-24 DIAGNOSIS — R6520 Severe sepsis without septic shock: Secondary | ICD-10-CM | POA: Diagnosis not present

## 2023-10-24 DIAGNOSIS — J9601 Acute respiratory failure with hypoxia: Secondary | ICD-10-CM | POA: Diagnosis not present

## 2023-10-24 DIAGNOSIS — Z7189 Other specified counseling: Secondary | ICD-10-CM | POA: Diagnosis not present

## 2023-10-24 DIAGNOSIS — E871 Hypo-osmolality and hyponatremia: Secondary | ICD-10-CM

## 2023-10-24 DIAGNOSIS — Z515 Encounter for palliative care: Secondary | ICD-10-CM | POA: Diagnosis not present

## 2023-10-24 DIAGNOSIS — A419 Sepsis, unspecified organism: Secondary | ICD-10-CM | POA: Diagnosis not present

## 2023-10-24 DIAGNOSIS — R4589 Other symptoms and signs involving emotional state: Secondary | ICD-10-CM | POA: Diagnosis not present

## 2023-10-24 DIAGNOSIS — G9341 Metabolic encephalopathy: Secondary | ICD-10-CM | POA: Diagnosis not present

## 2023-10-24 DIAGNOSIS — Z8551 Personal history of malignant neoplasm of bladder: Secondary | ICD-10-CM | POA: Diagnosis not present

## 2023-10-24 LAB — GLUCOSE, CAPILLARY
Glucose-Capillary: 112 mg/dL — ABNORMAL HIGH (ref 70–99)
Glucose-Capillary: 115 mg/dL — ABNORMAL HIGH (ref 70–99)
Glucose-Capillary: 149 mg/dL — ABNORMAL HIGH (ref 70–99)
Glucose-Capillary: 150 mg/dL — ABNORMAL HIGH (ref 70–99)
Glucose-Capillary: 153 mg/dL — ABNORMAL HIGH (ref 70–99)
Glucose-Capillary: 181 mg/dL — ABNORMAL HIGH (ref 70–99)

## 2023-10-24 LAB — CBC
HCT: 32.7 % — ABNORMAL LOW (ref 39.0–52.0)
Hemoglobin: 9.9 g/dL — ABNORMAL LOW (ref 13.0–17.0)
MCH: 29.6 pg (ref 26.0–34.0)
MCHC: 30.3 g/dL (ref 30.0–36.0)
MCV: 97.6 fL (ref 80.0–100.0)
Platelets: 162 10*3/uL (ref 150–400)
RBC: 3.35 MIL/uL — ABNORMAL LOW (ref 4.22–5.81)
RDW: 15.4 % (ref 11.5–15.5)
WBC: 8.5 10*3/uL (ref 4.0–10.5)
nRBC: 0 % (ref 0.0–0.2)

## 2023-10-24 LAB — BASIC METABOLIC PANEL
Anion gap: 11 (ref 5–15)
BUN: 37 mg/dL — ABNORMAL HIGH (ref 8–23)
CO2: 24 mmol/L (ref 22–32)
Calcium: 7.5 mg/dL — ABNORMAL LOW (ref 8.9–10.3)
Chloride: 92 mmol/L — ABNORMAL LOW (ref 98–111)
Creatinine, Ser: 0.72 mg/dL (ref 0.61–1.24)
GFR, Estimated: >60 mL/min (ref >60)
Glucose, Bld: 384 mg/dL — ABNORMAL HIGH (ref 70–99)
Potassium: 4 mmol/L (ref 3.5–5.1)
Sodium: 127 mmol/L — ABNORMAL LOW (ref 135–145)

## 2023-10-24 MED ORDER — PRAMIPEXOLE DIHYDROCHLORIDE 0.125 MG PO TABS
0.1250 mg | ORAL_TABLET | Freq: Three times a day (TID) | ORAL | Status: DC
  Administered 2023-10-24 – 2023-10-26 (×5): 0.125 mg
  Filled 2023-10-24 (×6): qty 1

## 2023-10-24 MED ORDER — ACETAMINOPHEN 160 MG/5ML PO SOLN
650.0000 mg | Freq: Four times a day (QID) | ORAL | Status: DC | PRN
  Administered 2023-10-24 – 2023-10-26 (×3): 650 mg
  Filled 2023-10-24 (×2): qty 20.3

## 2023-10-24 MED ORDER — ACETAMINOPHEN 160 MG/5ML PO SOLN
650.0000 mg | Freq: Four times a day (QID) | ORAL | Status: DC | PRN
  Filled 2023-10-24: qty 20.3

## 2023-10-24 MED ORDER — SODIUM BICARBONATE 650 MG PO TABS
650.0000 mg | ORAL_TABLET | Freq: Once | ORAL | Status: AC
  Administered 2023-10-24: 650 mg
  Filled 2023-10-24: qty 1

## 2023-10-24 MED ORDER — GABAPENTIN 250 MG/5ML PO SOLN
100.0000 mg | Freq: Three times a day (TID) | ORAL | Status: DC
  Administered 2023-10-24: 100 mg
  Filled 2023-10-24 (×3): qty 2

## 2023-10-24 MED ORDER — BISACODYL 5 MG PO TBEC
10.0000 mg | DELAYED_RELEASE_TABLET | Freq: Every day | ORAL | Status: DC
  Administered 2023-10-26 – 2023-10-29 (×3): 10 mg via ORAL
  Filled 2023-10-24 (×5): qty 2

## 2023-10-24 MED ORDER — STERILE WATER FOR INJECTION IJ SOLN
INTRAMUSCULAR | Status: AC
  Administered 2023-10-24: 10 mL
  Filled 2023-10-24: qty 10

## 2023-10-24 MED ORDER — FUROSEMIDE 10 MG/ML IJ SOLN
20.0000 mg | Freq: Every day | INTRAMUSCULAR | Status: AC
Start: 2023-10-25 — End: 2023-10-27
  Administered 2023-10-25 – 2023-10-27 (×3): 20 mg via INTRAVENOUS
  Filled 2023-10-24 (×3): qty 2

## 2023-10-24 MED ORDER — PANCRELIPASE (LIP-PROT-AMYL) 10440-39150 UNITS PO TABS
20880.0000 [IU] | ORAL_TABLET | Freq: Once | ORAL | Status: AC
  Administered 2023-10-24: 20880 [IU]
  Filled 2023-10-24: qty 2

## 2023-10-24 NOTE — Progress Notes (Signed)
 Has been weaning for several hours, doing well Wants to get the tube out ASAP Wife and son at bedside. Discussed plan for extubation, again re-iterated that if his resp status declines we will support with non-invasive measure as able, but he will not be re-intubated. It MV became indicated, instead would transition to comfort.  Pt and wife in agreement.    P -titrate fent off -extubate when off sedation -IS pulm hygiene post extubation  -NPO -O2 support for goal > 92     Tessie Fass MSN, AGACNP-BC [facility] Pulmonary/Critical Care Medicine 10/24/2023, 1:35 PM

## 2023-10-24 NOTE — IPAL (Signed)
  Interdisciplinary Goals of Care Family Meeting   Date carried out: 10/24/2023  Location of the meeting: Bedside / phone   Member's involved: Nurse Practitioner and Family Member or next of kin  Durable Power of Attorney or acting medical decision maker: slef / wife when unable     Discussion: We discussed goals of care for Garrett Ferguson .  Talked with Habib who is very awake and interactive this morning. We talked about possibly coming off the ventilator today. We talked about his wishes should he decline without mechanical ventilation. He indicates that he would NOT want to go back on a ventilator if his resp status declines to that point. We talked about methods like BiPAP, which he is agreeable to trying, but if declined to point where advanced airway is indicated he would not want this. He would want to transition to comfort care   I spoke with his wife on the phone after this. She echoes that he would not want reintubation. We talked about plans should he decline to that point and she agrees with transition to comfort care if non-invasive methods were not successful at aiding his respiratory function   Plan -change code status to DNR/I   Code status:   Code Status: Limited: Do not attempt resuscitation (DNR) -DNR-LIMITED -Do Not Intubate/DNI    Disposition: Continue current acute care  Time spent for the meeting: 17 min     Lanier Clam, NP  10/24/2023, 9:48 AM

## 2023-10-24 NOTE — Progress Notes (Signed)
 Pt extubated at 1425. Tolerated well and immediately placed on 4L via nasal cannula. All vitals WNL. Family updated in waiting room and brought to bedside. Pt able to speak in hoarse voice and is comfortable appearing.

## 2023-10-24 NOTE — Progress Notes (Signed)
 Daily Progress Note   Patient Name: Garrett Ferguson       Date: 10/24/2023 DOB: Nov 25, 1945  Age: 78 y.o. MRN#: [id number] Attending Physician: Tomma Lightning, MD Primary Care Physician: Care, [place] Primary Admit Date: 10/18/2023 Length of Stay: 6 days  Reason for Consultation/Follow-up: Establishing goals of care  Subjective:   CC: Patient awake on ventilator support.  Following up regarding complex medical decision making.  Subjective:  Reviewed EMR prior to seeing patient.  Discussed care with bedside RN for updates.  PCCM provider met with patient this morning to discuss potential extubation and care moving forward if extubation did not go well as hoped.  Patient has elected to change CODE STATUS to DNR/DNI as he would not want to be reintubated should he deteriorate from a respiratory standpoint.  Presented to bedside to see patient.  No family present at bedside.  Patient awake and alert on ventilator support.  Patient acknowledging that he wants to get off the ventilator support as soon as possible.  Patient does not want to be reintubated.  Patient finds the tube uncomfortable.  Patient does feel that his breathing has gotten better with removal of extra fluid.  Acknowledged continuing appropriate medical interventions with plan to extubate once deemed appropriate by PCCM provider.  Discussed care with PCCM after visiting with patient.  Hope can possibly extubate later today or tomorrow.  Will not reintubate.  Should patient deteriorate from a respiratory standpoint and noninvasive methods were not successful in assisting, would transition to comfort focused care.  Palliative medicine team continuing to follow along with patient's medical journey.  Objective:   Vital Signs:  BP (!) 118/40   Pulse 62   Temp 97.8 F (36.6 C) (Axillary)   Resp 18   Ht 5\' 11"  (1.803 m)   Wt 100.3 kg   SpO2 98%   BMI 30.84 kg/m   Physical Exam: General: Intubated, chronically  ill-appearing, awake, appropriately interacting with head nods HENT: ET tube in place Cardiovascular: RRR, edema in the lower extremities bilaterally Respiratory: Intubated on ventilator support Skin: Chronic venous stasis changes in lower extremities bilaterally Neuro: Appropriately interacting with head nods  Imaging: I personally reviewed recent imaging.   Assessment & Plan:   Assessment: Patient is an 7-year-old male with a past medical history of bladder cancer, hypertension, RLS, chronic pain, chronic lymphedema in lower extremities bilaterally, PAD, CKD, and GERD who was transferred from [facility] on 10/18/2023 after a fall at home when was noted had seizure.  Since admission, patient has received management for acute metabolic encephalopathy, acute hypoxic respiratory failure requiring intubation with mechanical ventilation support secondary to pneumonia, and acute onset A-fib with RVR.  Palliative medicine team consulted to assist with complex medical decision making.  Recommendations/Plan: # Complex medical decision making/goals of care:  - Patient remains intubated though awake and appropriately interacting with head nods as detailed above in HPI.  Continuing appropriate medical interventions to medically optimize and with plan to extubate and not reintubate.  Can use noninvasive methods for respiratory support if needed.  If patient deteriorates further from respiratory standpoint after extubation despite appropriate interventions, would transition to comfort focused care.  Palliative medicine team continuing to follow with patient's medical journey.  -  Code Status: Limited: Do not attempt resuscitation (DNR) -DNR-LIMITED -Do Not Intubate/DNI   # Psychosocial Support:  -Wife, son  # Discharge Planning: To Be Determined  Discussed with: PCCM, RN, patient  Thank you for allowing the palliative care  team to participate in the care Bari Mantis.  Alvester Morin, DO Palliative  Care Provider PMT # [phone]  If patient remains symptomatic despite maximum doses, please call PMT at [phone] between 0700 and 1900. Outside of these hours, please call attending, as PMT does not have night coverage.  Personally spent 35 minutes in patient care including extensive chart review (labs, imaging, progress/consult notes, vital signs), medically appropraite exam, discussed with treatment team, education to patient, family, and staff, documenting clinical information, medication review and management, coordination of care, and available advanced directive documents.

## 2023-10-24 NOTE — Plan of Care (Signed)
  Problem: Fluid Volume: Goal: Hemodynamic stability will improve Outcome: Progressing   Problem: Clinical Measurements: Goal: Diagnostic test results will improve Outcome: Progressing Goal: Signs and symptoms of infection will decrease Outcome: Progressing   Problem: Respiratory: Goal: Ability to maintain adequate ventilation will improve Outcome: Progressing   Problem: Education: Goal: Knowledge of General Education information will improve Description: Including pain rating scale, medication(s)/side effects and non-pharmacologic comfort measures Outcome: Progressing   Problem: Health Behavior/Discharge Planning: Goal: Ability to manage health-related needs will improve Outcome: Progressing   Problem: Clinical Measurements: Goal: Ability to maintain clinical measurements within normal limits will improve Outcome: Progressing Goal: Will remain free from infection Outcome: Progressing Goal: Diagnostic test results will improve Outcome: Progressing Goal: Respiratory complications will improve Outcome: Progressing Goal: Cardiovascular complication will be avoided Outcome: Progressing   Problem: Activity: Goal: Risk for activity intolerance will decrease Outcome: Progressing   Problem: Nutrition: Goal: Adequate nutrition will be maintained Outcome: Progressing   Problem: Coping: Goal: Level of anxiety will decrease Outcome: Progressing   Problem: Pain Managment: Goal: General experience of comfort will improve and/or be controlled Outcome: Progressing   Problem: Safety: Goal: Ability to remain free from injury will improve Outcome: Progressing   Problem: Activity: Goal: Ability to tolerate increased activity will improve Outcome: Progressing   Problem: Respiratory: Goal: Ability to maintain a clear airway and adequate ventilation will improve Outcome: Progressing   Problem: Role Relationship: Goal: Method of communication will improve Outcome:  Progressing   Problem: Coping: Goal: Ability to adjust to condition or change in health will improve Outcome: Progressing   Problem: Fluid Volume: Goal: Ability to maintain a balanced intake and output will improve Outcome: Progressing   Problem: Health Behavior/Discharge Planning: Goal: Ability to identify and utilize available resources and services will improve Outcome: Progressing Goal: Ability to manage health-related needs will improve Outcome: Progressing   Problem: Metabolic: Goal: Ability to maintain appropriate glucose levels will improve Outcome: Progressing   Problem: Nutritional: Goal: Maintenance of adequate nutrition will improve Outcome: Progressing Goal: Progress toward achieving an optimal weight will improve Outcome: Progressing   Problem: Tissue Perfusion: Goal: Adequacy of tissue perfusion will improve Outcome: Progressing

## 2023-10-24 NOTE — Procedures (Signed)
 Extubation Procedure Note  Patient Details:   Name: Garrett Ferguson DOB: [birth year] MRN: [id number]   Airway Documentation:  Airway 7.5 mm (Active)  Secured at (cm) 24 cm 10/24/23 1200  Measured From Lips 10/24/23 1200  Secured Location Right 10/24/23 1200  Secured By [employer] 10/24/23 1200  Bite Block No 10/24/23 1200  Tube Holder Repositioned Yes 10/24/23 1200  Prone position No 10/24/23 1200  Head position Left 10/24/23 1200  Cuff Pressure (cm H2O) Green OR 18-26 [facility] 10/24/23 0824  Site Condition Dry 10/24/23 1200   Vent end date: (not recorded) Vent end time: (not recorded)   Evaluation  O2 sats: stable throughout Complications: No apparent complications Patient did tolerate procedure well. Bilateral Breath Sounds: Diminished, Rhonchi   Yes  Pt extubated per MD order.  Pt tolerated well and placed on 4L [place].  Sats 98% HR 72, RR 16.    [name] 10/24/2023, 2:31 PM

## 2023-10-24 NOTE — Progress Notes (Signed)
 Found patient left's forearm PIV infiltrated and leaking onto bedding with Amiodarone infusing. MD, pharmacy, and charge nurse notified. New orders for hyaluronidase. Skin marked around the site, arm is elevated, warm compress applied. Skin is warm to touch with redness and skin firm.

## 2023-10-24 NOTE — Progress Notes (Signed)
 This RN noticed that this pt's tube feeds were alarming and upon restarting several times, the machine continued to alarm. Attempted to flush NG tube with sterile water without success and met significant resistance despite instilling 120ccs of water. Initiated clogged NG tube protocol with sodium bicarbonate and Viokace. Protocol states to leave instilled in tube for 15 minutes. Will update night shift RN to follow up with final flush of tube to confirm patency.

## 2023-10-24 NOTE — Progress Notes (Cosign Needed Addendum)
 NAME:  Garrett Ferguson, MRN:  [id number], DOB:  Mar 04, 1946, LOS: 6 ADMISSION DATE:  10/18/2023, CONSULTATION DATE:  10/18/23 REFERRING MD:  Dr. Flossie Dibble, CHIEF COMPLAINT:  AMS   Brief HPI:  78 y/o male with PMH for Bladder cancer, HTN, RLS, Chronic Lymphedema LE, PAD, CKD, GERD who had a fall at home (had fall night before as well but EMS checked his vitals and did not find him to a candidate for hospital), BIBA to [facility].  Verbal at home but then had a seizure.  Seizure lasting about 2 min per EMS, BG 109.  EMS placed in on 100% NRM and in the ED he had a nasal trumpet placed.  He was confused and agitated.  He was hypothermic 32.4 and Hypotensive 78/50 LA 2.0.  RVR, Influenza A and Covid negative.  UA positive for cloudy and increased WBC.  Sepsis protocol started.  He was transferred to [facility] ICU.  He presented unresponsive no response to pain/sternal rubs.  Presented with Albumin and Levophed running.  He did not have a aga either.  Code called for respiratory arrest and Dr Jarold Motto intubated the patient.  No loss of pulse, no CPR done.   Pertinent  Medical History  HTN, bladder cancer, urethral stricture s/p dilation 2022 with ongoing incomplete emptying/ LUTS CKD, HLD, PAD, GERD, restless leg, chronic lymphedema, osteoarthritis, chronic pain (left knee after multiple surgeries, followed by [facility] pain management- on gabapentin, oxycodone, Xtampza), anxiety/ depression, PUD, basal cell carcinoma, anisocoria  Significant Hospital Events: Including procedures, antibiotic start and stop dates in addition to other pertinent events   3/6 tx from [facility] ER to [facility], urosepsis, AMS, intubated on arrival  3/7 difficulty placing foley, ended up having trauma and urology was consulted 3/9 Off of pressors. Documented SBT x 9 hours. made DNR w palliative care.  3/10 Fib RVR w SBT; back to full support, NSR w amio gtt. Abx to doxy rocephin  3/11 fever, ongoing pressors. Weaned for a few hours in the late afternoon  but was pretty tired.  3/12 trying SBT again   Interim History / Subjective:   NAEO  7 NE CVP 5 3L out 3/11, net pos for the day yesterday however   Na 127   Objective   Blood pressure (!) 135/46, pulse 68, temperature 98.4 F (36.9 C), temperature source Axillary, resp. rate (!) 21, height 5\' 11"  (1.803 m), weight 100.3 kg, SpO2 99%. CVP:  [10 mmHg-15 mmHg] 10 mmHg  Vent Mode: CPAP;PSV FiO2 (%):  [30 %] 30 % Set Rate:  [15 bmp] 15 bmp Vt Set:  [600 mL] 600 mL PEEP:  [5 cmH20] 5 cmH20 Pressure Support:  [8 cmH20-10 cmH20] 8 cmH20 Plateau Pressure:  [15 cmH20-24 cmH20] 15 cmH20   Intake/Output Summary (Last 24 hours) at 10/24/2023 1008 Last data filed at 10/24/2023 0853 Gross per 24 hour  Intake 4140.91 ml  Output 3275 ml  Net 865.91 ml   Filed Weights   10/22/23 0701 10/23/23 0700 10/24/23 0440  Weight: 97.7 kg 100 kg 100.3 kg    Examination: Gen: Critically and chronically ill elderly M intubated lightly sedated  Neuro: Awakens, generally weak. Following commands  HEENT:  NCAT pink mm  anicteric sclera ETT secure Lungs:   CTAb, mechanically ventilated  CV:   rrr s1s2 no rgm  Abd: soft ndnt  GU: foley, yellow urine  Ext: BLE edema. LUE edema and bruising near elbow    Resolved Hospital Problem list   Thrombocytopenia  Assessment & Plan:   Acute metabolic encephalopathy, improving  -delirium precautions -RASS goal 0 to -1  Acute hypoxic resp failure CAP L>R Septic shock 2/2 PNA, possible cellulitis  -doxy rocephin to complete  -NE for SBP > 90 -VAP, pulm hygiene  -WUA/SBT -- possible extubation 3/12 -see IPAL 3/12 -- if patient fails extubation, he will not be re-intubated   RV failure Acute onset Afib RVR, now NSR Prolonged qtc Hx HTN HLD  -went into fib RVR 3/10 with Sbt Converted to SNR w amio. Has remained in sinus. Keeping amio as we are navigating SBTs still. With his hematuria and overall poor candidacy for AC, I deferred systemic AC  especially given transient nature of his fib  P -NE as above -cont amio, no AC. If stays in sinus after extubation can trial dc amio gtt vs starting PO  -ASA, statin -dropping lasix to 20mg  daily 3/13, otherwise holding antihypertensives w his current pressor req  -trend CVP   Hyponatremia -clinical exam + collateral data would not suggest this is hypervolemic hyponatremia.  P -as above, lasix is being dropped to 20; not on FWF  -not something that needs to be aggressively treated right now, will follow up in AM   Hematuria due to urethral trauma, improved  -foley x 7d at least per uro rec   Thrombocytopenia Anemia  -PRN CBC   RLS Chronic pain Anxiety / depression  -supportive care   Chronic lymphedema  -supportive care   Frequent falls Physical deconditioning  -PT when appropriate. GOC ongoing as below   Hyperglycemia -SSI   IV infiltration, LUE -supportive care   GOC DNR  -palliative following, appreciate their support  -see ipal note 3/12 -- updating code status to DNR/I   Best Practice (right click and "Reselect all SmartList Selections" daily)   Diet/type: NPO -- EN  DVT prophylaxis prophylactic heparin  Pressure ulcer(s): N/A GI prophylaxis: PPI Lines: Central line Foley:  Yes, and it is still needed x 7d at least  Code Status:  DNR Last date of multidisciplinary goals of care discussion  3/12, see IPAL   Labs   CBC: Recent Labs  Lab 10/18/23 1250 10/19/23 0504 10/19/23 1804 10/20/23 0435 10/21/23 0515 10/22/23 0848 10/23/23 0550 10/24/23 0509  WBC 4.0 7.6  --  6.7 7.4 8.2 8.2 8.5  NEUTROABS 3.0 6.2  --   --   --   --   --   --   HGB 11.6* 10.1*   < > 10.2* 10.0* 11.2* 9.1* 9.9*  HCT 37.0* 32.6*   < > 32.8* 32.3* 37.0* 30.2* 32.7*  MCV 91.8 95.9  --  94.5 94.7 97.1 96.5 97.6  PLT 149* 157  --  105* 110* 124* 149* 162   < > = values in this interval not displayed.    Basic Metabolic Panel: Recent Labs  Lab 10/18/23 1250  10/19/23 0504 10/19/23 1456 10/20/23 0435 10/21/23 0515 10/22/23 0848 10/23/23 0550 10/24/23 0509  NA 139 142   < > 134* 135 137 132* 127*  K 3.2* 4.0   < > 3.9 3.8 4.3 4.1 4.0  CL 109 109   < > 102 102 102 104 92*  CO2 25 24   < > 24 25 26 24 24   GLUCOSE 112* 99   < > 190* 95 89 176* 384*  BUN 25* 24*   < > 20 21 23  33* 37*  CREATININE 0.59* 0.80   < > 0.89 0.85 0.64 0.90 0.72  CALCIUM 8.5* 7.6*   < > 7.8* 8.2* 8.6* 7.4* 7.5*  MG 2.0 1.7  --  1.8  --   --   --   --   PHOS 3.4 2.8  --  2.9  --   --   --   --    < > = values in this interval not displayed.   GFR: Estimated Creatinine Clearance: 91.8 mL/min (by C-G formula based on SCr of 0.72 mg/dL). Recent Labs  Lab 10/18/23 1250 10/18/23 1435 10/18/23 1542 10/19/23 0504 10/21/23 0515 10/22/23 0848 10/23/23 0550 10/24/23 0509  PROCALCITON  --   --  <0.10  --   --   --   --   --   WBC 4.0  --   --    < > 7.4 8.2 8.2 8.5  LATICACIDVEN 2.0* 0.9 1.1  --   --   --   --   --    < > = values in this interval not displayed.    Liver Function Tests: Recent Labs  Lab 10/18/23 1250 10/19/23 0839  AST 57* 45*  ALT 45* 38  ALKPHOS 97 73  BILITOT 0.6 0.9  PROT 6.8 5.7*  ALBUMIN 3.3* 2.7*   No results for input(s): "LIPASE", "AMYLASE" in the last 168 hours. No results for input(s): "AMMONIA" in the last 168 hours.  ABG    Component Value Date/Time   PHART 7.38 10/19/2023 0411   PCO2ART 42 10/19/2023 0411   PO2ART 63 (L) 10/19/2023 0411   HCO3 24.2 10/19/2023 0411   ACIDBASEDEF 0.7 10/19/2023 0411   O2SAT 99.7 10/23/2023 0825     Coagulation Profile: Recent Labs  Lab 10/18/23 1250 10/19/23 0504  INR 1.1 1.3*    Cardiac Enzymes: No results for input(s): "CKTOTAL", "CKMB", "CKMBINDEX", "TROPONINI" in the last 168 hours.  HbA1C: Hgb A1c MFr Bld  Date/Time Value Ref Range Status  10/19/2023 08:39 AM 5.8 (H) 4.8 - 5.6 % Final    Comment:    (NOTE) Pre diabetes:          5.7%-6.4%  Diabetes:               >6.4%  Glycemic control for   <7.0% adults with diabetes     CBG: Recent Labs  Lab 10/23/23 1622 10/23/23 1954 10/24/23 0000 10/24/23 0433 10/24/23 0831  GLUCAP 118* 138* 149* 150* 181*   CRITICAL CARE Performed by: Lanier Clam   Total critical care time: 41 minutes  Critical care time was exclusive of separately billable procedures and treating other patients. Critical care was necessary to treat or prevent imminent or life-threatening deterioration.  Critical care was time spent personally by me on the following activities: development of treatment plan with patient and/or surrogate as well as nursing, discussions with consultants, evaluation of patient's response to treatment, examination of patient, obtaining history from patient or surrogate, ordering and performing treatments and interventions, ordering and review of laboratory studies, ordering and review of radiographic studies, pulse oximetry and re-evaluation of patient's condition.  Tessie Fass MSN, AGACNP-BC [facility] Pulmonary/Critical Care Medicine Amion for pager  10/24/2023, 10:08 AM

## 2023-10-25 DIAGNOSIS — R739 Hyperglycemia, unspecified: Secondary | ICD-10-CM | POA: Diagnosis not present

## 2023-10-25 DIAGNOSIS — Z515 Encounter for palliative care: Secondary | ICD-10-CM | POA: Diagnosis not present

## 2023-10-25 DIAGNOSIS — E871 Hypo-osmolality and hyponatremia: Secondary | ICD-10-CM | POA: Diagnosis not present

## 2023-10-25 DIAGNOSIS — Z7189 Other specified counseling: Secondary | ICD-10-CM | POA: Diagnosis not present

## 2023-10-25 DIAGNOSIS — R4589 Other symptoms and signs involving emotional state: Secondary | ICD-10-CM | POA: Diagnosis not present

## 2023-10-25 DIAGNOSIS — G9341 Metabolic encephalopathy: Secondary | ICD-10-CM | POA: Diagnosis not present

## 2023-10-25 DIAGNOSIS — Z8551 Personal history of malignant neoplasm of bladder: Secondary | ICD-10-CM | POA: Diagnosis not present

## 2023-10-25 DIAGNOSIS — J9601 Acute respiratory failure with hypoxia: Secondary | ICD-10-CM | POA: Diagnosis not present

## 2023-10-25 LAB — CBC WITH DIFFERENTIAL/PLATELET
Abs Immature Granulocytes: 0.08 10*3/uL — ABNORMAL HIGH (ref 0.00–0.07)
Basophils Absolute: 0 10*3/uL (ref 0.0–0.1)
Basophils Relative: 0 %
Eosinophils Absolute: 0.2 10*3/uL (ref 0.0–0.5)
Eosinophils Relative: 3 %
HCT: 31.4 % — ABNORMAL LOW (ref 39.0–52.0)
Hemoglobin: 9.6 g/dL — ABNORMAL LOW (ref 13.0–17.0)
Immature Granulocytes: 1 %
Lymphocytes Relative: 9 %
Lymphs Abs: 0.8 10*3/uL (ref 0.7–4.0)
MCH: 29.9 pg (ref 26.0–34.0)
MCHC: 30.6 g/dL (ref 30.0–36.0)
MCV: 97.8 fL (ref 80.0–100.0)
Monocytes Absolute: 0.6 10*3/uL (ref 0.1–1.0)
Monocytes Relative: 7 %
Neutro Abs: 7.1 10*3/uL (ref 1.7–7.7)
Neutrophils Relative %: 80 %
Platelets: 156 10*3/uL (ref 150–400)
RBC: 3.21 MIL/uL — ABNORMAL LOW (ref 4.22–5.81)
RDW: 15.2 % (ref 11.5–15.5)
WBC: 8.9 10*3/uL (ref 4.0–10.5)
nRBC: 0 % (ref 0.0–0.2)

## 2023-10-25 LAB — GLUCOSE, CAPILLARY
Glucose-Capillary: 108 mg/dL — ABNORMAL HIGH (ref 70–99)
Glucose-Capillary: 113 mg/dL — ABNORMAL HIGH (ref 70–99)
Glucose-Capillary: 114 mg/dL — ABNORMAL HIGH (ref 70–99)
Glucose-Capillary: 116 mg/dL — ABNORMAL HIGH (ref 70–99)
Glucose-Capillary: 128 mg/dL — ABNORMAL HIGH (ref 70–99)
Glucose-Capillary: 135 mg/dL — ABNORMAL HIGH (ref 70–99)

## 2023-10-25 LAB — BASIC METABOLIC PANEL
Anion gap: 9 (ref 5–15)
BUN: 34 mg/dL — ABNORMAL HIGH (ref 8–23)
CO2: 26 mmol/L (ref 22–32)
Calcium: 7.7 mg/dL — ABNORMAL LOW (ref 8.9–10.3)
Chloride: 96 mmol/L — ABNORMAL LOW (ref 98–111)
Creatinine, Ser: 0.64 mg/dL (ref 0.61–1.24)
GFR, Estimated: >60 mL/min (ref >60)
Glucose, Bld: 328 mg/dL — ABNORMAL HIGH (ref 70–99)
Potassium: 3.8 mmol/L (ref 3.5–5.1)
Sodium: 131 mmol/L — ABNORMAL LOW (ref 135–145)

## 2023-10-25 LAB — MAGNESIUM: Magnesium: 1.9 mg/dL (ref 1.7–2.4)

## 2023-10-25 LAB — PHOSPHORUS: Phosphorus: 2.9 mg/dL (ref 2.5–4.6)

## 2023-10-25 MED ORDER — ORAL CARE MOUTH RINSE: 15.0000 mL | OROMUCOSAL | Status: DC | PRN

## 2023-10-25 MED ORDER — ORAL CARE MOUTH RINSE
15.0000 mL | OROMUCOSAL | Status: DC
  Administered 2023-10-25 – 2023-10-26 (×7): 15 mL via OROMUCOSAL

## 2023-10-25 MED ORDER — CHLORHEXIDINE GLUCONATE CLOTH 2 % EX PADS
6.0000 | MEDICATED_PAD | Freq: Every day | CUTANEOUS | Status: DC
  Administered 2023-10-26 – 2023-11-02 (×9): 6 via TOPICAL

## 2023-10-25 MED ORDER — HYDROXYZINE HCL 10 MG/5ML PO SYRP
10.0000 mg | ORAL_SOLUTION | Freq: Three times a day (TID) | ORAL | Status: DC | PRN
Start: 2023-10-25 — End: 2023-11-03
  Administered 2023-10-25 – 2023-10-31 (×6): 10 mg via ORAL
  Filled 2023-10-25 (×7): qty 5

## 2023-10-25 MED ORDER — POTASSIUM CHLORIDE 20 MEQ PO PACK
40.0000 meq | PACK | Freq: Once | ORAL | Status: AC
  Administered 2023-10-25: 40 meq
  Filled 2023-10-25: qty 2

## 2023-10-25 MED ORDER — VITAL 1.5 CAL PO LIQD
1000.0000 mL | ORAL | Status: DC
  Administered 2023-10-25: 1000 mL
  Filled 2023-10-25 (×2): qty 1000

## 2023-10-25 MED ORDER — MAGNESIUM SULFATE 2 GM/50ML IV SOLN
2.0000 g | Freq: Once | INTRAVENOUS | Status: AC
  Administered 2023-10-25: 2 g via INTRAVENOUS
  Filled 2023-10-25: qty 50

## 2023-10-25 MED ORDER — GABAPENTIN 250 MG/5ML PO SOLN
200.0000 mg | Freq: Three times a day (TID) | ORAL | Status: DC
  Administered 2023-10-25 – 2023-10-26 (×4): 200 mg
  Filled 2023-10-25 (×5): qty 4

## 2023-10-25 MED ORDER — KETOROLAC TROMETHAMINE 15 MG/ML IJ SOLN
15.0000 mg | Freq: Once | INTRAMUSCULAR | Status: AC
  Administered 2023-10-25: 15 mg via INTRAVENOUS
  Filled 2023-10-25: qty 1

## 2023-10-25 NOTE — TOC Progression Note (Signed)
 Transition of Care [facility]) - Progression Note    Patient Details  Name: Garrett Ferguson MRN: [id number] Date of Birth: [birth year]  Transition of Care [facility]) CM/SW Contact  Adrian Prows, RN Phone Number: 10/25/2023, 11:25 AM  Clinical Narrative:    Awaiting PT/OT evals; TOC is following.     Barriers to Discharge: Continued Medical Work up  Expected Discharge Plan and Services In-house Referral: NA Discharge Planning Services: NA   Living arrangements for the past 2 months: Single Family Home                   DME Agency: NA       HH Arranged: NA           Social Determinants of Health (SDOH) Interventions SDOH Screenings   Food Insecurity: Patient Unable To Answer (10/21/2023)  Housing: Patient Unable To Answer (10/21/2023)  Transportation Needs: Patient Unable To Answer (10/21/2023)  Utilities: Patient Unable To Answer (10/21/2023)  Financial Resource Strain: Medium Risk (04/06/2021)   Received from [facility], [facility]  Physical Activity: Insufficiently Active (02/23/2021)   Received from [facility], [facility]  Social Connections: Patient Unable To Answer (10/21/2023)  Stress: No Stress Concern Present (02/23/2021)   Received from [facility], [facility]  Tobacco Use: Low Risk  (10/18/2023)  Health Literacy: Low Risk  (02/23/2021)   Received from [facility], [facility]    Readmission Risk Interventions    10/22/2023    1:09 PM  Readmission Risk Prevention Plan  Transportation Screening Complete  PCP or Specialist Appt within 5-7 Days Complete  Home Care Screening Complete  Medication Review (RN CM) Complete

## 2023-10-25 NOTE — Progress Notes (Signed)
   Inpatient Rehab Admissions Coordinator :  Per OT therapy recommendations patient was screened for CIR candidacy by Ottie Glazier RN MSN. PT recommends SNF. Patient is not at a level to tolerate the intensity required to pursue a CIR admit. Bed level evals today. The CIR admissions team will follow and monitor for progress and place a Rehab Consult order if felt to be appropriate. Please contact me with any questions.  Ottie Glazier RN MSN Admissions Coordinator [phone]

## 2023-10-25 NOTE — Evaluation (Signed)
 Occupational Therapy Evaluation Patient Details Name: Garrett Ferguson MRN: [id number] DOB: [birth year] Today's Date: 10/25/2023   History of Present Illness   78 yo male brought to ED via  EMS on 10/18/23 with  falls, seizure, hypotension, hypothermia, sepsis, respiratory arrest requiring uintubation. Extubated 10/24/23. WUJ:WJXBJYN cancer, HTN, RLS, Chronic Lymphedema LE, PAD, CKD, GERD, L hand RSD, L TKA x 3, TURP     Clinical Impressions This 78 yo male admitted with above presents to acute OT with PLOF of being independent with basic ADLs, driving, and working as a Programmer, multimedia. He currently is total A +2 at bed level for basic ADLs (+2 needed for rolling and maintaining side lying) and total A +2 for all bed mobility. He will continue to benefit from acute OT with follow up intensive inpatient follow-up therapy, >3 hours/day.      If plan is discharge home, recommend the following:   Two people to help with walking and/or transfers;Two people to help with bathing/dressing/bathroom;Assistance with cooking/housework;Assistance with feeding;Help with stairs or ramp for entrance;Assist for transportation     Functional Status Assessment   Patient has had a recent decline in their functional status and demonstrates the ability to make significant improvements in function in a reasonable and predictable amount of time.     Equipment Recommendations   Wheelchair (measurements OT);Wheelchair cushion (measurements OT);Hospital bed;Hoyer lift      Precautions/Restrictions   Precautions Precautions: Fall Precaution/Restrictions Comments: BM incontinence, left knee painfull with decreased extension Restrictions Weight Bearing Restrictions Per Provider Order: No     Mobility Bed Mobility Overal bed mobility: Needs Assistance Bed Mobility: Rolling, Supine to Sit Rolling: Total assist, +2 for physical assistance   Supine to sit: Total assist, +2 for physical assistance     General  bed mobility comments: HOB all the way up and using the bed pad to spin him around to sit EOB        Balance Overall balance assessment: Needs assistance Sitting-balance support: No upper extremity supported, Feet supported Sitting balance-Leahy Scale: Poor Sitting balance - Comments: needs VCs to stay forward and does not attempt to use Bil UEs to support self unless cued to do so                                   ADL either performed or assessed with clinical judgement   ADL Overall ADL's : Needs assistance/impaired                                       General ADL Comments: total A for all at bed level     Vision Baseline Vision/History: 1 Wears glasses Patient Visual Report: No change from baseline              Pertinent Vitals/Pain Pain Assessment Pain Assessment: Faces Faces Pain Scale: Hurts whole lot Pain Location: legs  when rolling  and sitting Pain Descriptors / Indicators: Aching, Discomfort, Grimacing Pain Intervention(s): Limited activity within patient's tolerance, Monitored during session, Patient requesting pain meds-RN notified     Extremity/Trunk Assessment Upper Extremity Assessment Upper Extremity Assessment: Generalized weakness;Right hand dominant;RUE deficits/detail;LUE deficits/detail RUE Deficits / Details: Decreased AROM throughout due to edema RUE Coordination: decreased fine motor;decreased gross motor LUE Deficits / Details: Decreased AROM throughout due to edema LUE Coordination: decreased fine motor;decreased gross motor  Lower Extremity Assessment Lower Extremity Assessment: LLE deficits/detail;RLE deficits/detail LLE Deficits / Details: bleeding fro85m lower leg       Communication Communication Communication: No apparent difficulties Factors Affecting Communication: Hearing impaired   Cognition Arousal: Alert Behavior During Therapy: WFL for tasks assessed/performed                                  Following commands: Intact       Cueing   Cueing Techniques: Verbal cues;Gestural cues;Tactile cues              Home Living Family/patient expects to be discharged to:: Private residence Living Arrangements: Spouse/significant other;Children Available Help at Discharge: Family;Available 24 hours/day Type of Home: House Home Access: Stairs to enter [employer] of Steps: 4 Entrance Stairs-Rails: Right Home Layout: One level     Bathroom Shower/Tub: Producer, television/film/video: Handicapped height     Home Equipment: Medical illustrator (2 wheels);Grab bars - tub/shower;Wheelchair - manual   Additional Comments: Pt is a Programmer, multimedia      Prior Functioning/Environment Prior Level of Function : Independent/Modified Independent             Mobility Comments: pt. reports used No AD ADLs Comments: pt reports independent    OT Problem List: Decreased strength;Decreased range of motion;Impaired balance (sitting and/or standing);Decreased coordination;Decreased safety awareness;Obesity;Impaired UE functional use;Pain   OT Treatment/Interventions: Self-care/ADL training;DME and/or AE instruction;Balance training;Patient/family education;Therapeutic exercise      OT Goals(Current goals can be found in the care plan section)   Acute Rehab OT Goals Patient Stated Goal: to get back to preaching, was agreeable to get up to recliner today but upon sitting up on EOB he needed to be cleaned up so OOB deferred this session OT Goal Formulation: With patient Time For Goal Achievement: 11/08/23 Potential to Achieve Goals: Good   OT Frequency:  Min 2X/week    Co-evaluation PT/OT/SLP Co-Evaluation/Treatment: Yes Reason for Co-Treatment: Complexity of the patient's impairments (multi-system involvement);For patient/therapist safety PT goals addressed during session: Mobility/safety with mobility OT goals addressed during  session: ADL's and self-care      AM-PAC OT "6 Clicks" Daily Activity     Outcome Measure Help from another person eating meals?: Total Help from another person taking care of personal grooming?: Total Help from another person toileting, which includes using toliet, bedpan, or urinal?: Total Help from another person bathing (including washing, rinsing, drying)?: Total Help from another person to put on and taking off regular upper body clothing?: Total Help from another person to put on and taking off regular lower body clothing?: Total 6 Click Score: 6   End of Session Nurse Communication: Mobility status  Activity Tolerance: Patient tolerated treatment well Patient left: in bed;with call bell/phone within reach;with bed alarm set  OT Visit Diagnosis: Other abnormalities of gait and mobility (R26.89);Muscle weakness (generalized) (M62.81);Pain Pain - Right/Left:  (both LEs with movement)                Time: 4403-4742 OT Time Calculation (min): 44 min Charges:  OT General Charges $OT Visit: 1 Visit OT Evaluation $OT Eval Moderate Complexity: 1 Mod OT Treatments $Therapeutic Activity: 8-22 mins  Lindon Romp OT Acute Rehabilitation Services Office [phone]    Evette Georges 10/25/2023, 11:52 AM

## 2023-10-25 NOTE — Progress Notes (Signed)
 Nutrition Follow-up  DOCUMENTATION CODES:   Not applicable  INTERVENTION:  -  TF regimen via Cortrak: Vital 1.5 @ 55 ml/hr  Prosource TF20 60 ml daily Provides 2060 kcals, 109g protein and 1008 ml H2O  - FWF per MD. If diet unable to advanced, recommend Q4H to provide a total of 1635mL/day   - Monitor weight trends.  - Will monitor for diet advancement.    NUTRITION DIAGNOSIS:   Inadequate oral intake related to inability to eat as evidenced by NPO status. *ongoing  GOAL:   Patient will meet greater than or equal to 90% of their needs *met with TF  MONITOR:   Vent status, TF tolerance  REASON FOR ASSESSMENT:   Consult, Ventilator Enteral/tube feeding initiation and management, Assessment of nutrition requirement/status  ASSESSMENT:   78 y.o. with a past medical history of  hypertension, RLS, chronic lymphedema, PAD, CKD, GERD who presented to [facility] last night after suffering a fall.  Report however was that he had a seizure.  He presented the ED agitated hypotensive as well as in respiratory failure.  3/6 Admit; Intubated 3/7 Vital 1.5 initiated @ 20 3/9 Reached goal of Vital 1.5 @ 60  3/10 Cortrak placed (tip in stomach); TF resumed at goal 3/12 One-way extubation; TF resumed via Cortrak 3/13 SLP eval -> pt too lethargic   Patient sleeping at time of visit. TF infusing at goal of 72mL/hr.  SLP eval attempted later this afternoon but patient too lethargic at this time. Plan to try eval in the AM.   Plan to continue goal TF's via Cortrak for now. Hopeful for diet advancement tomorrow.  Results of eval to help determine ongoing goals of care discussions. Palliative continues to follow.     Admit weight: 227# Current weight: 226# I&O's: +4.3L since admit + for mild pitting generalized edema and moderate pitting edema to lower extremities  Medications reviewed and include: Dulcolax, Colace, Miralax, Lasix  Labs reviewed:  Na 131   Diet  Order:   Diet Order             Diet NPO time specified  Diet effective now                   EDUCATION NEEDS:  Not appropriate for education at this time  Skin:  Skin Assessment: Reviewed RN Assessment  Last BM:  3/12  Height:  Ht Readings from Last 1 Encounters:  10/18/23 5\' 11"  (1.803 m)   Weight:  Wt Readings from Last 1 Encounters:  10/25/23 102.6 kg   BMI:  Body mass index is 31.55 kg/m.  Estimated Nutritional Needs:  Kcal:  1900-2100 kcals Protein:  100-115 grams Fluid:  >/= 1.9L    Shelle Iron RD, LDN Contact via Secure Chat.

## 2023-10-25 NOTE — Progress Notes (Addendum)
 Daily Progress Note   Patient Name: Garrett Ferguson       Date: 10/25/2023 DOB: October 06, 1945  Age: 78 y.o. MRN#: [id number] Attending Physician: Tomma Lightning, MD Primary Care Physician: Care, [place] Primary Admit Date: 10/18/2023 Length of Stay: 7 days  Reason for Consultation/Follow-up: Establishing goals of care  Subjective:   CC: Patient thankful to be off ventilator.  Following up regarding complex medical decision making.  Subjective:  Reviewed EMR prior to seeing patient.  Patient was extubated yesterday afternoon.  Patient currently on nasal cannula support at 2 L/min.  Discussed care with PCCM provider and RN for updates.  Patient may transfer to hospitalist service today.  Presented to bedside to see patient.  No family present at bedside.  Patient lying comfortably in bed.  Patient noted appreciation that he is now off of the ventilator support and able to interact.  Patient's only concern at this time with itching on the back of his neck.  Looked at patient's neck and no signs of rash.  RN able to apply lotion to dry skin.  Patient still has NG tube in place.  SLP supposed to come by for evaluation which would be helpful in providing further information about pathways for medical care to pursue moving forward.  Patient noted that he has "a long way to go" he knows.  Discussed importance of continued conversations with family regarding hopes for medical care moving forward.  Noted palliative medicine team will continue to follow along to engage in this as able.  Continuing appropriate medical management at this time.  Patient is DNR/DNI.  Objective:   Vital Signs:  BP (!) 131/48   Pulse 73   Temp 98.4 F (36.9 C) (Oral)   Resp 20   Ht 5\' 11"  (1.803 m)   Wt 102.6 kg   SpO2 99%   BMI 31.55 kg/m   Physical Exam: General: Awake, chronically ill-appearing, appropriately interacting though hard of hearing Cardiovascular: RRR, edema in the lower extremities  bilaterally Respiratory: O2 support with nasal cannula, no increased work of breathing noted Skin: Chronic venous stasis changes in lower extremities bilaterally  Imaging: I personally reviewed recent imaging.   Assessment & Plan:   Assessment: Patient is an 78 year old male with a past medical history of bladder cancer, hypertension, RLS, chronic pain, chronic lymphedema in lower extremities bilaterally, PAD, CKD, and GERD who was transferred from [facility] on 10/18/2023 after a fall at home when was noted had seizure.  Since admission, patient has received management for acute metabolic encephalopathy, acute hypoxic respiratory failure requiring intubation with mechanical ventilation support secondary to pneumonia, and acute onset A-fib with RVR.  Palliative medicine team consulted to assist with complex medical decision making.  Recommendations/Plan: # Complex medical decision making/goals of care:  - Patient now extubated and appropriately interacting.  Discussed care with patient as detailed above in HPI.  Continuing appropriate medical interventions at this time.  Awaiting SLP evaluation to obtain further information to help in guiding pathways for medical care moving forward.  Palliative medicine team continuing to follow along to engage in conversations as able.  -  Code Status: Limited: Do not attempt resuscitation (DNR) -DNR-LIMITED -Do Not Intubate/DNI   # Psychosocial Support:  -Wife, son  # Discharge Planning: To Be Determined  Discussed with: PCCM, RN, patient  Thank you for allowing the palliative care team to participate in the care Bari Mantis.  Alvester Morin, DO Palliative Care Provider PMT # [phone]  If  patient remains symptomatic despite maximum doses, please call PMT at [phone] between 0700 and 1900. Outside of these hours, please call attending, as PMT does not have night coverage.  Personally spent 35 minutes in patient care including extensive chart  review (labs, imaging, progress/consult notes, vital signs), medically appropraite exam, discussed with treatment team, education to patient, family, and staff, documenting clinical information, medication review and management, coordination of care, and available advanced directive documents.

## 2023-10-25 NOTE — Plan of Care (Signed)
  Problem: Respiratory: Goal: Ability to maintain adequate ventilation will improve Outcome: Progressing   Problem: Clinical Measurements: Goal: Respiratory complications will improve Outcome: Progressing   Problem: Coping: Goal: Level of anxiety will decrease Outcome: Progressing   Problem: Pain Managment: Goal: General experience of comfort will improve and/or be controlled Outcome: Progressing

## 2023-10-25 NOTE — Progress Notes (Signed)
 [facility] ADULT ICU REPLACEMENT PROTOCOL   The patient does apply for the [facility] Adult ICU Electrolyte Replacment Protocol based on the criteria listed below:   1.Exclusion criteria: TCTS, ECMO, Dialysis, and Myasthenia Gravis patients 2. Is GFR >/= 30 ml/min? Yes.    Patient's GFR today is >60 3. Is SCr </= 2? Yes.   Patient's SCr is 0.64 mg/dL 4. Did SCr increase >/= 0.5 in 24 hours? No. 5.Pt's weight >40kg  Yes.   6. Abnormal electrolyte(s): mag 1.9  7. Electrolytes replaced per protocol 8.  Call MD STAT for K+ </= 2.5, Phos </= 1, or Mag </= 1 Physician:  protocol  Melvern Banker 10/25/2023 5:22 AM

## 2023-10-25 NOTE — Progress Notes (Signed)
 NAME:  Garrett Ferguson, MRN:  [id number], DOB:  1946-06-08, LOS: 7 ADMISSION DATE:  10/18/2023, CONSULTATION DATE:  10/18/23 REFERRING MD:  Dr. Flossie Dibble, CHIEF COMPLAINT:  AMS   Brief HPI:  78 y/o male with PMH for Bladder cancer, HTN, RLS, Chronic Lymphedema LE, PAD, CKD, GERD who had a fall at home (had fall night before as well but EMS checked his vitals and did not find him to a candidate for hospital), BIBA to [facility].  Verbal at home but then had a seizure.  Seizure lasting about 2 min per EMS, BG 109.  EMS placed in on 100% NRM and in the ED he had a nasal trumpet placed.  He was confused and agitated.  He was hypothermic 32.4 and Hypotensive 78/50 LA 2.0.  RVR, Influenza A and Covid negative.  UA positive for cloudy and increased WBC.  Sepsis protocol started.  He was transferred to [facility] ICU.  He presented unresponsive no response to pain/sternal rubs.  Presented with Albumin and Levophed running.  He did not have a aga either.  Code called for respiratory arrest and Dr Jarold Motto intubated the patient.  No loss of pulse, no CPR done.   Pertinent  Medical History  HTN, bladder cancer, urethral stricture s/p dilation 2022 with ongoing incomplete emptying/ LUTS CKD, HLD, PAD, GERD, restless leg, chronic lymphedema, osteoarthritis, chronic pain (left knee after multiple surgeries, followed by [facility] pain management- on gabapentin, oxycodone, Xtampza), anxiety/ depression, PUD, basal cell carcinoma, anisocoria  Significant Hospital Events: Including procedures, antibiotic start and stop dates in addition to other pertinent events   3/6 tx from [facility] ER to [facility], urosepsis, AMS, intubated on arrival  3/7 difficulty placing foley, ended up having trauma and urology was consulted 3/9 Off of pressors. Documented SBT x 9 hours. made DNR w palliative care.  3/10 Fib RVR w SBT; back to full support, NSR w amio gtt. Abx to doxy rocephin  3/11 fever, ongoing pressors. Weaned for a few hours in the late afternoon  but was pretty tired.  3/12 one-way extubation, DNR/ DNI  Interim History / Subjective:   C/o of itching, otherwise no complaints Failed bedside [facility]ale, pending SLP Off NE  Objective   Blood pressure (!) 131/48, pulse 73, temperature 98.4 F (36.9 C), temperature source Oral, resp. rate 20, height 5\' 11"  (1.803 m), weight 102.6 kg, SpO2 99%. CVP:  [8 mmHg-19 mmHg] 11 mmHg  Vent Mode: CPAP;PSV FiO2 (%):  [30 %] 30 % PEEP:  [5 cmH20] 5 cmH20 Pressure Support:  [8 cmH20] 8 cmH20 Plateau Pressure:  [18 cmH20] 18 cmH20   Intake/Output Summary (Last 24 hours) at 10/25/2023 0845 Last data filed at 10/25/2023 4098 Gross per 24 hour  Intake 2975.75 ml  Output 875 ml  Net 2100.75 ml   Filed Weights   10/24/23 0440 10/25/23 0415 10/25/23 0500  Weight: 100.3 kg 104.4 kg 102.6 kg    Examination: General:  chronically ill appearing elderly male sitting upright in bed in NAD HEENT: MM pink/moist, edentulous Neuro: Awake, oriented x 3, generalized weakness/ MAE CV: rr, SR, no murmur, RUE DL PICC PULM:  non labored, clear anteriorly, diminished in bases, 2L  GI: soft, bs+, NT, foley, FMS Extremities: warm/dry, generalized edema, LE wrapped, LE AC with erythema/ edema with borders marked from amio extravasation  Skin: no rashes  Labs> CBC stable, Na 131, K 3.8, Mag 1.9 UOP 861ml/ 24hrs 1 stool yest Net positive 4L Wts 100.3> 102.6Kg  Resolved Hospital Problem  list   Thrombocytopenia  Assessment & Plan:   Acute metabolic encephalopathy, resolved - delirium precautions  Acute hypoxic resp failure, improved CAP L>R Septic shock 2/2 PNA, possible LE cellulitis, resolved Dysphagia  - off pressors, goal MAP > 65 - completed 7 days of doxy/rocephin 3/12, monitor clinically  - cont to wean [place] for sat goal > 92% - pulm hygiene/ IS/ flutter, mobilize w/ PT/ OT - xopenex/ atrovent prn - pending SLP given failed bedside yale, cont NPO, EN per cortrak - aspiration precautions - TF  per RD - would be ok with BiPAP but per GOC, transition to comfort measures if failed, DNR/ DNI.  Appreciate PMT assistance  - stable for transfer to SDU and TRH as of 3/14  RV failure Acute onset Afib RVR, now NSR Prolonged qtc Hx HTN HLD  -went into fib RVR 3/10 with Sbt Converted to SNR w amio. Has remained in sinus. Keeping amio as we are navigating SBTs still. With his hematuria and overall poor candidacy for [facility], I deferred systemic AC especially given transient nature of his fib  P - remains in SR w/ stable RR. Poor candidate for longterm AC.   Hold amio for now as stable - cont daily diureses as renal/ hemodynamically tolerated - optimize electrolytes - cont ASA/ statin - cont to hold pta lisinopril, may be able to start fractionated BB soon if BP remains stable  Hyponatremia  P - trend on BMET  Hematuria due to urethral trauma, improved  - foley placed by urology 3/8, recs to continue x 7d before starting void trials, consider 3/16  Thrombocytopenia Anemia  - stable, PRN CBC   RLS Chronic pain Anxiety / depression  - continue supportive care, oxy prn, gabapentin. Adding hydroxyzine prn.  Bowel regimen in place   Chronic lymphedema  - supportive care, WOC  Frequent falls Physical deconditioning - mostly wheelchair dependent since August 2024 - PT/ OT   Hyperglycemia - SSI prn  IV infiltration, LUE 2/2 amiodarone - supportive care   GOC DNR  - palliative following, appreciate their support  - see ipal note 3/12 -- DNR/I   Best Practice (right click and "Reselect all SmartList Selections" daily)   Diet/type: NPO -- EN  DVT prophylaxis prophylactic heparin  Pressure ulcer(s): N/A GI prophylaxis: N/A and PPI Lines: Central line (RUE PICC) Foley:  Yes, and it is still needed, reassess 3/16 Code Status:  DNR/ DNI Last date of multidisciplinary goals of care discussion  3/12, see IPAL   No family at bedside 3/13 am  Labs   CBC: Recent Labs  Lab  10/18/23 1250 10/19/23 0504 10/19/23 1804 10/21/23 0515 10/22/23 0848 10/23/23 0550 10/24/23 0509 10/25/23 0441  WBC 4.0 7.6   < > 7.4 8.2 8.2 8.5 8.9  NEUTROABS 3.0 6.2  --   --   --   --   --  7.1  HGB 11.6* 10.1*   < > 10.0* 11.2* 9.1* 9.9* 9.6*  HCT 37.0* 32.6*   < > 32.3* 37.0* 30.2* 32.7* 31.4*  MCV 91.8 95.9   < > 94.7 97.1 96.5 97.6 97.8  PLT 149* 157   < > 110* 124* 149* 162 156   < > = values in this interval not displayed.    Basic Metabolic Panel: Recent Labs  Lab 10/18/23 1250 10/19/23 0504 10/19/23 1456 10/20/23 0435 10/21/23 0515 10/22/23 0848 10/23/23 0550 10/24/23 0509 10/25/23 0441  NA 139 142   < > 134* 135 137 132* 127*  131*  K 3.2* 4.0   < > 3.9 3.8 4.3 4.1 4.0 3.8  CL 109 109   < > 102 102 102 104 92* 96*  CO2 25 24   < > 24 25 26 24 24 26   GLUCOSE 112* 99   < > 190* 95 89 176* 384* 328*  BUN 25* 24*   < > 20 21 23  33* 37* 34*  CREATININE 0.59* 0.80   < > 0.89 0.85 0.64 0.90 0.72 0.64  CALCIUM 8.5* 7.6*   < > 7.8* 8.2* 8.6* 7.4* 7.5* 7.7*  MG 2.0 1.7  --  1.8  --   --   --   --  1.9  PHOS 3.4 2.8  --  2.9  --   --   --   --  2.9   < > = values in this interval not displayed.   GFR: Estimated Creatinine Clearance: 92.8 mL/min (by C-G formula based on SCr of 0.64 mg/dL). Recent Labs  Lab 10/18/23 1250 10/18/23 1435 10/18/23 1542 10/19/23 0504 10/22/23 0848 10/23/23 0550 10/24/23 0509 10/25/23 0441  PROCALCITON  --   --  <0.10  --   --   --   --   --   WBC 4.0  --   --    < > 8.2 8.2 8.5 8.9  LATICACIDVEN 2.0* 0.9 1.1  --   --   --   --   --    < > = values in this interval not displayed.    Liver Function Tests: Recent Labs  Lab 10/18/23 1250 10/19/23 0839  AST 57* 45*  ALT 45* 38  ALKPHOS 97 73  BILITOT 0.6 0.9  PROT 6.8 5.7*  ALBUMIN 3.3* 2.7*   No results for input(s): "LIPASE", "AMYLASE" in the last 168 hours. No results for input(s): "AMMONIA" in the last 168 hours.  ABG    Component Value Date/Time   PHART 7.38  10/19/2023 0411   PCO2ART 42 10/19/2023 0411   PO2ART 63 (L) 10/19/2023 0411   HCO3 24.2 10/19/2023 0411   ACIDBASEDEF 0.7 10/19/2023 0411   O2SAT 99.7 10/23/2023 0825     Coagulation Profile: Recent Labs  Lab 10/18/23 1250 10/19/23 0504  INR 1.1 1.3*    Cardiac Enzymes: No results for input(s): "CKTOTAL", "CKMB", "CKMBINDEX", "TROPONINI" in the last 168 hours.  HbA1C: Hgb A1c MFr Bld  Date/Time Value Ref Range Status  10/19/2023 08:39 AM 5.8 (H) 4.8 - 5.6 % Final    Comment:    (NOTE) Pre diabetes:          5.7%-6.4%  Diabetes:              >6.4%  Glycemic control for   <7.0% adults with diabetes     CBG: Recent Labs  Lab 10/24/23 1624 10/24/23 1933 10/25/23 0044 10/25/23 0403 10/25/23 0754  GLUCAP 115* 112* 135* 108* 128*        Posey Boyer, MSN, AG-ACNP-BC [facility] 10/25/2023, 8:45 AM  See Amion for pager If no response to pager , please call [phone] until 7pm After 7:00 pm call Elink  [phone]

## 2023-10-25 NOTE — Evaluation (Signed)
 SLP Cancellation Note  Patient Details Name: Garrett Ferguson MRN: [id number] DOB: 07/13/1946   Cancelled treatment:       Reason Eval/Treat Not Completed: Other (comment);Fatigue/lethargy limiting ability to participate (pt did not pass [facility] swallow screen, RN reports pt coughing with water, not eliciting swallow and allowing anterior spillage of liquids. Reports he has feeding tube for nutrition.   Per chart review, no plans to reintubate pt, thus Colette Ribas, NP, with critical care seeking guidance. She was agreeable to wait until next morning to allow pt more time to recover/improve.  Will see in am. Thanks! )  Rolena Infante, MS [facility] SLP Acute Rehab Services Office [phone]   Chales Abrahams 10/25/2023, 1:59 PM

## 2023-10-25 NOTE — Evaluation (Signed)
 Physical Therapy Evaluation Patient Details Name: Garrett Ferguson MRN: [id number] DOB: [birth year] Today's Date: 10/25/2023  History of Present Illness  78 yo male brought to ED via  EMS on 10/18/23 with  falls, seizure, hypotension, hypothermia, sepsis, respiratory arrest requiring uintubation. Extubated 10/24/23. UYQ:[id number] cancer, HTN, RLS, Chronic Lymphedema LE, PAD, CKD, GERD, L hand RSD, L TKA x 3, TURP  Clinical Impression  Pt admitted with above diagnosis.  Pt currently with functional limitations due to the deficits listed below (see PT Problem List). Pt will benefit from acute skilled PT to increase their independence and safety with mobility to allow discharge.     The patient required max/total assistance of 2 to roll and move to sitting onto bed edge x ~ 5 mins. Patient reporting significant pain with all mobility.   Patient with  wounds on LLE, noted  bleeding through dressing, RN notified.  Patient maintained on   2 L [place], Spo2 >90%.  Patient will benefit from continued inpatient follow up therapy, <3 hours/day.      If plan is discharge home, recommend the following: Two people to help with walking and/or transfers;Two people to help with bathing/dressing/bathroom;Help with stairs or ramp for entrance;Assistance with cooking/housework   Can travel by private vehicle   No    Equipment Recommendations None recommended by PT  Recommendations for Other Services       Functional Status Assessment Patient has had a recent decline in their functional status and demonstrates the ability to make significant improvements in function in a reasonable and predictable amount of time.     Precautions / Restrictions Precautions Precautions: Fall Precaution/Restrictions Comments: BM incontinence, left knee painfull with decreased extension Restrictions Weight Bearing Restrictions Per Provider Order: No      Mobility  Bed Mobility               General bed mobility comments:  HOB all the way up and using the bed pad to spin him around to sit EOB, total assist to return to supine    Transfers                   General transfer comment: unable    Ambulation/Gait                  Stairs            Wheelchair Mobility     Tilt Bed    Modified Rankin (Stroke Patients Only)       Balance                                             Pertinent Vitals/Pain Pain Assessment Pain Assessment: Faces Faces Pain Scale: Hurts whole lot Pain Location: legs  when rolling  and sitting Pain Descriptors / Indicators: Aching, Discomfort, Grimacing, Moaning Pain Intervention(s): Repositioned, Monitored during session, Limited activity within patient's tolerance    Home Living Family/patient expects to be discharged to:: Private residence Living Arrangements: Spouse/significant other;Children Available Help at Discharge: Family;Available 24 hours/day Type of Home: House Home Access: Stairs to enter Entrance Stairs-Rails: Right Entrance Stairs-Number of Steps: 4   Home Layout: One level Home Equipment: Medical illustrator (2 wheels);Grab bars - tub/shower;Wheelchair - manual 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 reports used No AD ADLs Comments: pt reports independent     Extremity/Trunk Assessment   Upper Extremity Assessment Upper Extremity Assessment: Generalized weakness RUE Deficits / Details: Decreased AROM throughout due to edema RUE Coordination: decreased fine motor;decreased gross motor LUE Deficits / Details: Decreased AROM throughout due to edema LUE Coordination: decreased fine motor;decreased gross motor    Lower Extremity Assessment Lower Extremity Assessment: LLE deficits/detail;RLE deficits/detail RLE Deficits / Details: noted  sores on lower legs, edema LLE  Deficits / Details: bleeding from lower leg, lacks ~ 20 degress full extension    Cervical / Trunk Assessment Cervical / Trunk Assessment: Kyphotic  Communication   Communication Communication: No apparent difficulties Factors Affecting Communication: Hearing impaired    Cognition Arousal: Alert Behavior During Therapy: WFL for tasks assessed/performed   PT - Cognitive impairments: No apparent impairments                         Following commands: Intact       Cueing Cueing Techniques: Verbal cues, Gestural cues, Tactile cues     General Comments      Exercises     Assessment/Plan    PT Assessment Patient needs continued PT services  PT Problem List Decreased strength;Decreased range of motion;Cardiopulmonary status limiting activity;Decreased activity tolerance;Decreased knowledge of use of DME;Decreased balance;Decreased mobility;Decreased skin integrity       PT Treatment Interventions DME instruction;Therapeutic exercise;Functional mobility training;Therapeutic activities;Patient/family education    PT Goals (Current goals can be found in the Care Plan section)  Acute Rehab PT Goals Patient Stated Goal: none stated PT Goal Formulation: With patient Time For Goal Achievement: 11/08/23 Potential to Achieve Goals: Fair    Frequency Min 2X/week     Co-evaluation PT/OT/SLP Co-Evaluation/Treatment: Yes Reason for Co-Treatment: Complexity of the patient's impairments (multi-system involvement);For patient/therapist safety PT goals addressed during session: Mobility/safety with mobility OT goals addressed during session: ADL's and self-care       AM-PAC PT "6 Clicks" Mobility  Outcome Measure Help needed turning from your back to your side while in a flat bed without using bedrails?: Total Help needed moving from lying on your back to sitting on the side of a flat bed without using bedrails?: Total Help needed moving to and from a bed to a chair  (including a wheelchair)?: Total Help needed standing up from a chair using your arms (e.g., wheelchair or bedside chair)?: Total Help needed to walk in hospital room?: Total Help needed climbing 3-5 steps with a railing? : Total 6 Click Score: 6    End of Session   Activity Tolerance: Patient limited by pain;Patient limited by fatigue Patient left: in bed;with call bell/phone within reach;with bed alarm set Nurse Communication: Mobility status PT Visit Diagnosis: Unsteadiness on feet (R26.81);Muscle weakness (generalized) (M62.81);Pain Pain - Right/Left: Left Pain - part of body: Knee    Time: 0832-0916 PT Time Calculation (min) (ACUTE ONLY): 44 min   Charges:   PT Evaluation $PT Eval Low Complexity: 1 Low   PT General Charges $$ ACUTE PT VISIT: 1 Visit         Blanchard Kelch PT Acute Rehabilitation Services Office [phone] Weekend [phone]   Rada Hay 10/25/2023, 2:19 PM

## 2023-10-26 DIAGNOSIS — A419 Sepsis, unspecified organism: Secondary | ICD-10-CM | POA: Diagnosis not present

## 2023-10-26 DIAGNOSIS — Z7189 Other specified counseling: Secondary | ICD-10-CM | POA: Diagnosis not present

## 2023-10-26 DIAGNOSIS — R6520 Severe sepsis without septic shock: Secondary | ICD-10-CM | POA: Diagnosis not present

## 2023-10-26 DIAGNOSIS — I890 Lymphedema, not elsewhere classified: Secondary | ICD-10-CM | POA: Diagnosis not present

## 2023-10-26 DIAGNOSIS — Z8551 Personal history of malignant neoplasm of bladder: Secondary | ICD-10-CM | POA: Diagnosis not present

## 2023-10-26 DIAGNOSIS — Z515 Encounter for palliative care: Secondary | ICD-10-CM | POA: Diagnosis not present

## 2023-10-26 LAB — CBC
HCT: 32.3 % — ABNORMAL LOW (ref 39.0–52.0)
Hemoglobin: 9.7 g/dL — ABNORMAL LOW (ref 13.0–17.0)
MCH: 29.2 pg (ref 26.0–34.0)
MCHC: 30 g/dL (ref 30.0–36.0)
MCV: 97.3 fL (ref 80.0–100.0)
Platelets: 220 10*3/uL (ref 150–400)
RBC: 3.32 MIL/uL — ABNORMAL LOW (ref 4.22–5.81)
RDW: 15 % (ref 11.5–15.5)
WBC: 9.4 10*3/uL (ref 4.0–10.5)
nRBC: 0 % (ref 0.0–0.2)

## 2023-10-26 LAB — GLUCOSE, CAPILLARY
Glucose-Capillary: 103 mg/dL — ABNORMAL HIGH (ref 70–99)
Glucose-Capillary: 109 mg/dL — ABNORMAL HIGH (ref 70–99)
Glucose-Capillary: 110 mg/dL — ABNORMAL HIGH (ref 70–99)
Glucose-Capillary: 124 mg/dL — ABNORMAL HIGH (ref 70–99)
Glucose-Capillary: 124 mg/dL — ABNORMAL HIGH (ref 70–99)
Glucose-Capillary: 131 mg/dL — ABNORMAL HIGH (ref 70–99)

## 2023-10-26 LAB — BASIC METABOLIC PANEL
Anion gap: 8 (ref 5–15)
BUN: 30 mg/dL — ABNORMAL HIGH (ref 8–23)
CO2: 26 mmol/L (ref 22–32)
Calcium: 7.9 mg/dL — ABNORMAL LOW (ref 8.9–10.3)
Chloride: 106 mmol/L (ref 98–111)
Creatinine, Ser: 0.57 mg/dL — ABNORMAL LOW (ref 0.61–1.24)
GFR, Estimated: >60 mL/min (ref >60)
Glucose, Bld: 112 mg/dL — ABNORMAL HIGH (ref 70–99)
Potassium: 3.9 mmol/L (ref 3.5–5.1)
Sodium: 140 mmol/L (ref 135–145)

## 2023-10-26 LAB — MAGNESIUM: Magnesium: 2.1 mg/dL (ref 1.7–2.4)

## 2023-10-26 MED ORDER — CARMEX CLASSIC LIP BALM EX OINT: 1.0000 | TOPICAL_OINTMENT | CUTANEOUS | Status: DC | PRN

## 2023-10-26 MED ORDER — LIDOCAINE HCL URETHRAL/MUCOSAL 2 % EX GEL
1.0000 | Freq: Two times a day (BID) | CUTANEOUS | Status: DC | PRN
  Administered 2023-10-26: 1 via URETHRAL
  Filled 2023-10-26 (×2): qty 5

## 2023-10-26 MED ORDER — POLYETHYLENE GLYCOL 3350 17 G PO PACK
17.0000 g | PACK | Freq: Every day | ORAL | Status: DC
  Administered 2023-10-27: 17 g via ORAL
  Filled 2023-10-26 (×3): qty 1

## 2023-10-26 MED ORDER — PRAMIPEXOLE DIHYDROCHLORIDE 0.125 MG PO TABS
0.1250 mg | ORAL_TABLET | Freq: Three times a day (TID) | ORAL | Status: AC
  Administered 2023-10-26 – 2023-10-27 (×4): 0.125 mg via ORAL
  Filled 2023-10-26 (×4): qty 1

## 2023-10-26 MED ORDER — ATORVASTATIN CALCIUM 10 MG PO TABS
10.0000 mg | ORAL_TABLET | Freq: Every day | ORAL | Status: DC
  Administered 2023-10-27 – 2023-11-03 (×8): 10 mg via ORAL
  Filled 2023-10-26 (×8): qty 1

## 2023-10-26 MED ORDER — OXYCODONE HCL 5 MG PO TABS
5.0000 mg | ORAL_TABLET | Freq: Three times a day (TID) | ORAL | Status: DC
  Administered 2023-10-26 – 2023-10-30 (×13): 5 mg via ORAL
  Filled 2023-10-26 (×13): qty 1

## 2023-10-26 MED ORDER — ASPIRIN 81 MG PO CHEW
81.0000 mg | CHEWABLE_TABLET | Freq: Every day | ORAL | Status: DC
  Administered 2023-10-27 – 2023-11-03 (×8): 81 mg via ORAL
  Filled 2023-10-26 (×8): qty 1

## 2023-10-26 MED ORDER — ACETAMINOPHEN 325 MG PO TABS
650.0000 mg | ORAL_TABLET | Freq: Four times a day (QID) | ORAL | Status: DC | PRN
  Administered 2023-10-26 – 2023-11-02 (×9): 650 mg via ORAL
  Filled 2023-10-26 (×9): qty 2

## 2023-10-26 MED ORDER — DOCUSATE SODIUM 100 MG PO CAPS
100.0000 mg | ORAL_CAPSULE | Freq: Two times a day (BID) | ORAL | Status: DC
Start: 2023-10-26 — End: 2023-11-03
  Administered 2023-10-26 – 2023-10-29 (×6): 100 mg via ORAL
  Filled 2023-10-26 (×8): qty 1

## 2023-10-26 MED ORDER — GABAPENTIN 100 MG PO CAPS
200.0000 mg | ORAL_CAPSULE | Freq: Three times a day (TID) | ORAL | Status: AC
  Administered 2023-10-26 – 2023-10-27 (×4): 200 mg via ORAL
  Filled 2023-10-26 (×4): qty 2

## 2023-10-26 MED ORDER — ADULT MULTIVITAMIN W/MINERALS CH
1.0000 | ORAL_TABLET | Freq: Every day | ORAL | Status: DC
  Administered 2023-10-27 – 2023-11-03 (×8): 1 via ORAL
  Filled 2023-10-26 (×8): qty 1

## 2023-10-26 NOTE — Progress Notes (Signed)
 PROGRESS NOTE  Garrett Ferguson  YNW:[id number] DOB: 1945-11-20 DOA: 10/18/2023 PCP: [facility]   Brief Narrative: Patient is a 78 year old male with history of bladder cancer,HTN,, restless leg syndrome, chronic lymphedema of B/L  lower extremity, peripheral artery disease, GERD who was brought initially to [facility] after he fell at home.  EMS found him to be seizing.  He was confused and agitated afterwards, hypothermic, hypotensive then became unresponsive.  On presentation, UA was suspicious for UTI.  Sepsis protocol started.  Transferred to [facility] long ICU.  Code called for respiratory arrest that started on vasopressors, intubated.  No loss of pulse or CPR done.  Palliative care consulted for goals of care discussion.  Currently DNR.  Patient transferred to [facility] service on 3/14.  Clinically improving.  PT/OT recommending SNF on discharge  Important events: 3/6 tx from [facility] ER to [facility], urosepsis, AMS, intubated on arrival  3/7 difficulty placing foley, ended up having trauma and urology was consulted 3/9 Off of pressors. Documented SBT x 9 hours. made DNR w palliative care.  3/10 Fib RVR w SBT; back to full support, NSR w amio gtt. Abx to doxy rocephin  3/11 fever, ongoing pressors. Weaned for a few hours in the late afternoon but was pretty tired.  3/12 one-way extubation, DNR/ DNI Weaned to room air    Assessment & Plan:  Principal Problem:   Severe sepsis (HCC) Active Problems:   UTI (urinary tract infection)   Hypotensive episode   Lymphedema   Hypertension   History of bladder cancer   Hypoalbuminemia   Septic shock (HCC)   Acute hypoxic respiratory failure (HCC)   Palliative care encounter   Goals of care, counseling/discussion   Need for emotional support   Counseling and coordination of care   Acute hypoxic respiratory failure: Suspected to be from community-acquired pneumonia, septic shock.  Initially intubated, now extubated and on nasal cannula.    Goal is to wean oxygen.  Continue pulmonary hygiene, flutter valve, incentive spirometer, bronchodilators as needed. As per goals of care discussion, okay for BiPAP if needed but transition to comfort care if continues to decline. This morning he was on room air.  He was coughing.  As per ICU orders, he was on IV Lasix, we can continue for today.  Follow-up chest x-ray  Septic shock: Thought to be secondary to pneumonia, possible left lower extremity cellulitis.Completed 7 days course of doxycycline and ceftriaxone on 3/12.  Cultures have remained negative so far.  Dysphagia: Speech therapy following.  Started on regular diet today.  Feeding tube will be taken out tomorrow  A-fib with RVR: Went into rapid A-fib on 3/10 but spontaneously converted to normal sinus rhythm with amiodarone.  This was most likely associated with acute respiratory distress.  Currently on aspirin, statin.  Anticoagulation not started.  Donot think anticoagulation is indicated  Hematuria: Likely from traumatic catheterization.  Foley placed by urology on 3/8.  Recommendation is to continue for 7 days before restarting voiding trial.  Voiding trial on 3/16  Normocytic anemia: Currently stable.  Continue to monitor  History of chronic lymphedema: Continue supportive care, wound care was consulted  Frequent falls/physical deconditioning: Mostly wheelchair dependent at home since March 15, 2023.  PT/OT consulted.  SNF,/CIR recommended  Hyperglycemia: Currently on sliding scale.  A1c of 5.8.  Blood sugar stable now.  History of restless leg syndrome/chronic pain syndrome/anxiety/depression: On as needed oxycodone, hydroxyzine.  Goals of care: Palliative care following.  DNR/DNI.  Goal is to continue current care but family opting for comfort care if further declines.  Patient is clinically improving.   Nutrition Problem: Inadequate oral intake Etiology: inability to eat    DVT prophylaxis:heparin injection 5,000 Units  Start: 10/18/23 2200 SCDs Start: 10/18/23 2032 TED hose Start: 10/18/23 1442     Code Status: Limited: Do not attempt resuscitation (DNR) -DNR-LIMITED -Do Not Intubate/DNI   Family Communication: None at bedside  Patient status: Inpatient  Patient is from : Home  Anticipated discharge to: SNF  Estimated DC date: 2 to 3 days   Consultants: PCCM, palliative care  Procedures: Intubation  Antimicrobials:  Anti-infectives (From admission, onward)    Start     Dose/Rate Route Frequency Ordered Stop   10/22/23 2200  doxycycline (VIBRAMYCIN) 100 mg in sodium chloride 0.9 % 250 mL IVPB        100 mg 125 mL/hr over 120 Minutes Intravenous Every 12 hours 10/22/23 0807 10/25/23 0038   10/22/23 1000  cefTRIAXone (ROCEPHIN) 2 g in sodium chloride 0.9 % 100 mL IVPB        2 g 200 mL/hr over 30 Minutes Intravenous Every 24 hours 10/22/23 0807 10/24/23 1352   10/20/23 2200  vancomycin (VANCOREADY) IVPB 1500 mg/300 mL  Status:  Discontinued        1,500 mg 150 mL/hr over 120 Minutes Intravenous Every 24 hours 10/20/23 0922 10/22/23 0807   10/19/23 2200  vancomycin (VANCOREADY) IVPB 1750 mg/350 mL  Status:  Discontinued        1,750 mg 175 mL/hr over 120 Minutes Intravenous Every 24 hours 10/18/23 2223 10/20/23 0922   10/19/23 1200  metroNIDAZOLE (FLAGYL) IVPB 500 mg  Status:  Discontinued        500 mg 100 mL/hr over 60 Minutes Intravenous Every 12 hours 10/19/23 1042 10/22/23 0807   10/19/23 1130  cefTRIAXone (ROCEPHIN) 2 g in sodium chloride 0.9 % 100 mL IVPB  Status:  Discontinued        2 g 200 mL/hr over 30 Minutes Intravenous Every 24 hours 10/19/23 1042 10/22/23 0807   10/18/23 2230  vancomycin (VANCOREADY) IVPB 2000 mg/400 mL        2,000 mg 200 mL/hr over 120 Minutes Intravenous  Once 10/18/23 2130 10/19/23 0056   10/18/23 2200  ceFEPIme (MAXIPIME) 2 g in sodium chloride 0.9 % 100 mL IVPB  Status:  Discontinued        2 g 200 mL/hr over 30 Minutes Intravenous Every 8 hours  10/18/23 1533 10/19/23 1042   10/18/23 1530  ceFEPIme (MAXIPIME) 2 g in sodium chloride 0.9 % 100 mL IVPB        2 g 200 mL/hr over 30 Minutes Intravenous  Once 10/18/23 1446 10/18/23 1614   10/18/23 1215  cefTRIAXone (ROCEPHIN) 2 g in sodium chloride 0.9 % 100 mL IVPB        2 g 200 mL/hr over 30 Minutes Intravenous Once 10/18/23 1206 10/18/23 1325       Subjective: Patient seen and examined at bedside today.  Looks comfortable.  Alert oriented.  Lying on bed.  Appears comfortable .  On room air.  Denies new complaints.  EKG monitor showed normal sinus rhythm.  Speech therapy just evaluated him, cleared for regular diet.  Denies any discomfort or pain.  But looks very deconditioned  Objective: Vitals:   10/26/23 0200 10/26/23 0300 10/26/23 0400 10/26/23 0500  BP: (!) 112/52 (!) 122/46 (!) 123/47 (!) 131/51  Pulse: 72 73  72 73  Resp: (!) 22 (!) 29 (!) 29 (!) 29  Temp:   98.5 F (36.9 C)   TempSrc:   Axillary   SpO2: 92% 92% 94% 94%  Weight:    100 kg  Height:        Intake/Output Summary (Last 24 hours) at 10/26/2023 0742 Last data filed at 10/26/2023 0600 Gross per 24 hour  Intake 1360.96 ml  Output 1575 ml  Net -214.04 ml   Filed Weights   10/25/23 0415 10/25/23 0500 10/26/23 0500  Weight: 104.4 kg 102.6 kg 100 kg    Examination:  General exam: Overall comfortable, not in distress, chronically ill looking, deconditioned, weak,obese HEENT: PERRL Respiratory system: Diminished sounds on bases, no wheezes or crackles  Cardiovascular system: S1 & S2 heard, RRR.  Gastrointestinal system: Abdomen is mildly distended, soft and nontender. Central nervous system: Alert and oriented Extremities: LE chronic bilateral LE edema, no clubbing ,no cyanosis Skin: No rashes, no ulcers,no icterus     Data Reviewed: I have personally reviewed following labs and imaging studies  CBC: Recent Labs  Lab 10/22/23 0848 10/23/23 0550 10/24/23 0509 10/25/23 0441 10/26/23 0456  WBC  8.2 8.2 8.5 8.9 9.4  NEUTROABS  --   --   --  7.1  --   HGB 11.2* 9.1* 9.9* 9.6* 9.7*  HCT 37.0* 30.2* 32.7* 31.4* 32.3*  MCV 97.1 96.5 97.6 97.8 97.3  PLT 124* 149* 162 156 220   Basic Metabolic Panel: Recent Labs  Lab 10/20/23 0435 10/21/23 0515 10/22/23 0848 10/23/23 0550 10/24/23 0509 10/25/23 0441 10/26/23 0456  NA 134*   < > 137 132* 127* 131* 140  K 3.9   < > 4.3 4.1 4.0 3.8 3.9  CL 102   < > 102 104 92* 96* 106  CO2 24   < > 26 24 24 26 26   GLUCOSE 190*   < > 89 176* 384* 328* 112*  BUN 20   < > 23 33* 37* 34* 30*  CREATININE 0.89   < > 0.64 0.90 0.72 0.64 0.57*  CALCIUM 7.8*   < > 8.6* 7.4* 7.5* 7.7* 7.9*  MG 1.8  --   --   --   --  1.9 2.1  PHOS 2.9  --   --   --   --  2.9  --    < > = values in this interval not displayed.     Recent Results (from the past 240 hours)  Urine Culture (for pregnant, neutropenic or urologic patients or patients with an indwelling urinary catheter)     Status: None   Collection Time: 10/18/23 12:09 PM   Specimen: Urine, Clean Catch  Result Value Ref Range Status   Specimen Description   Final    URINE, CLEAN CATCH Performed at [facility], [address] [place], [place] [address]    Special Requests   Final    NONE Performed at [facility], [address], [place], [place] [address]    Culture   Final    NO GROWTH Performed at [facility], [address], [place], [place] [address]    Report Status 10/20/2023 FINAL  Final  Resp panel by RT-PCR (RSV, Flu A&B, Covid) Anterior Nasal Swab     Status: None   Collection Time: 10/18/23 12:12 PM   Specimen: Anterior Nasal Swab  Result Value Ref Range Status   SARS Coronavirus 2 by RT PCR NEGATIVE NEGATIVE Final  Comment: (NOTE) SARS-CoV-2 target nucleic acids are NOT DETECTED.  The SARS-CoV-2 RNA is generally detectable in upper respiratory specimens during the acute phase of infection. The lowest concentration of SARS-CoV-2 viral copies this assay can detect  is 138 copies/mL. A negative result does not preclude SARS-Cov-2 infection and should not be used as the sole basis for treatment or other patient management decisions. A negative result may occur with  improper specimen collection/handling, submission of specimen other than nasopharyngeal swab, presence of viral mutation(s) within the areas targeted by this assay, and inadequate number of viral copies(<138 copies/mL). A negative result must be combined with clinical observations, patient history, and epidemiological information. The expected result is Negative.  Fact Sheet for Patients:  BloggerCourse.com  Fact Sheet for Healthcare Providers:  SeriousBroker.it  This test is no t yet approved or cleared by the [place] FDA and  has been authorized for detection and/or diagnosis of SARS-CoV-2 by FDA under an Emergency Use Authorization (EUA). This EUA will remain  in effect (meaning this test can be used) for the duration of the COVID-19 declaration under Section 564(b)(1) of the Act, 21 U.S.C.section 360bbb-3(b)(1), unless the authorization is terminated  or revoked sooner.       Influenza A by PCR NEGATIVE NEGATIVE Final   Influenza B by PCR NEGATIVE NEGATIVE Final    Comment: (NOTE) The Xpert Xpress SARS-CoV-2/FLU/RSV plus assay is intended as an aid in the diagnosis of influenza from Nasopharyngeal swab specimens and should not be used as a sole basis for treatment. Nasal washings and aspirates are unacceptable for Xpert Xpress SARS-CoV-2/FLU/RSV testing.  Fact Sheet for Patients: BloggerCourse.com  Fact Sheet for Healthcare Providers: SeriousBroker.it  This test is not yet approved or cleared by the [place] FDA and has been authorized for detection and/or diagnosis of SARS-CoV-2 by FDA under an Emergency Use Authorization (EUA). This EUA will remain in effect  (meaning this test can be used) for the duration of the COVID-19 declaration under Section 564(b)(1) of the Act, 21 U.S.C. section 360bbb-3(b)(1), unless the authorization is terminated or revoked.     Resp Syncytial Virus by PCR NEGATIVE NEGATIVE Final    Comment: (NOTE) Fact Sheet for Patients: BloggerCourse.com  Fact Sheet for Healthcare Providers: SeriousBroker.it  This test is not yet approved or cleared by the [place] FDA and has been authorized for detection and/or diagnosis of SARS-CoV-2 by FDA under an Emergency Use Authorization (EUA). This EUA will remain in effect (meaning this test can be used) for the duration of the COVID-19 declaration under Section 564(b)(1) of the Act, 21 U.S.C. section 360bbb-3(b)(1), unless the authorization is terminated or revoked.  Performed at [facility], [address], [place], [place] [address]   Blood Culture (routine x 2)     Status: None   Collection Time: 10/18/23 12:50 PM   Specimen: BLOOD  Result Value Ref Range Status   Specimen Description BLOOD RFOA  Final   Special Requests   Final    Blood Culture results may not be optimal due to an inadequate volume of blood received in culture bottles BOTTLES DRAWN AEROBIC AND ANAEROBIC   Culture   Final    NO GROWTH 5 DAYS Performed at [facility], [address] [place], [place] [address]    Report Status 10/23/2023 FINAL  Final  Blood Culture (routine x 2)     Status: None   Collection Time: 10/18/23 12:50 PM   Specimen: BLOOD  Result Value Ref Range Status  Specimen Description BLOOD BLOOD RIGHT ARM  Final   Special Requests   Final    BOTTLES DRAWN AEROBIC AND ANAEROBIC Blood Culture adequate volume   Culture   Final    NO GROWTH 5 DAYS Performed at [facility], [address], [place], [place] [address]    Report Status 10/23/2023 FINAL  Final  Culture, blood (Routine X 2) w Reflex to ID Panel     Status: None    Collection Time: 10/18/23  9:01 PM   Specimen: BLOOD  Result Value Ref Range Status   Specimen Description   Final    BLOOD BLOOD RIGHT HAND Performed at [facility], [address] [place], [place] [address]    Special Requests   Final    BOTTLES DRAWN AEROBIC AND ANAEROBIC Blood Culture results may not be optimal due to an inadequate volume of blood received in culture bottles Performed at [facility], [address], [place], [place] [address]    Culture   Final    NO GROWTH 5 DAYS Performed at [facility], [address] [place], [place] [address]    Report Status 10/23/2023 FINAL  Final  Culture, blood (Routine X 2) w Reflex to ID Panel     Status: None   Collection Time: 10/18/23  9:01 PM   Specimen: BLOOD  Result Value Ref Range Status   Specimen Description   Final    BLOOD BLOOD RIGHT ARM Performed at [facility], [address], [place], [place] [address]    Special Requests   Final    BOTTLES DRAWN AEROBIC AND ANAEROBIC Blood Culture results may not be optimal due to an inadequate volume of blood received in culture bottles Performed at [facility], [address], [place], [place] [address]    Culture   Final    NO GROWTH 5 DAYS Performed at [facility], [address], [place], [place] [address]    Report Status 10/23/2023 FINAL  Final  Urine Culture (for pregnant, neutropenic or urologic patients or patients with an indwelling urinary catheter)     Status: None   Collection Time: 10/19/23  8:39 AM   Specimen: Urine, Catheterized  Result Value Ref Range Status   Specimen Description   Final    URINE, CATHETERIZED Performed at [facility], [address], [place], [place] [address]    Special Requests   Final    NONE Performed at [facility], [address], [place], [place] [address]    Culture   Final    NO  GROWTH Performed at [facility], [address], [place], [place] [address]    Report Status 10/20/2023 FINAL  Final  Culture, Respiratory w Gram Stain     Status: None   Collection Time: 10/19/23  8:39 AM   Specimen: Tracheal Aspirate; Respiratory  Result Value Ref Range Status   Specimen Description   Final    TRACHEAL ASPIRATE Performed at [facility], [address], [place], [place] [address]    Special Requests   Final    NONE Performed at [facility], [address], [place], [place] [address]    Gram Stain   Final    RARE WBC PRESENT,BOTH PMN AND MONONUCLEAR RARE GRAM NEGATIVE RODS    Culture   Final    NO GROWTH 2 DAYS Performed at [facility],  [address], [place], [place] [address]    Report Status 10/21/2023 FINAL  Final  MRSA Next Gen by PCR, Nasal     Status: None   Collection Time: 10/19/23  8:39 AM   Specimen: Nasal Mucosa; Nasal Swab  Result Value Ref Range Status   MRSA by PCR Next Gen NOT DETECTED NOT DETECTED Final    Comment: (NOTE) The GeneXpert MRSA Assay (FDA approved for NASAL specimens only), is one component of a comprehensive MRSA colonization surveillance program. It is not intended to diagnose MRSA infection nor to guide or monitor treatment for MRSA infections. Test performance is not FDA approved in patients less than 59 years old. Performed at [facility], [address] [place], [place] [address]      Radiology Studies: No results found.  Scheduled Meds:  aspirin  81 mg Per Tube Daily   atorvastatin  10 mg Per Tube Daily   bisacodyl  10 mg Oral Daily   Chlorhexidine Gluconate Cloth  6 each Topical Daily   docusate  100 mg Per Tube BID   feeding supplement (PROSource TF20)  60 mL Per Tube Daily   furosemide  20 mg Intravenous Daily   gabapentin  200 mg Per Tube TID   heparin  5,000 Units Subcutaneous Q8H   insulin aspart  0-9 Units Subcutaneous Q4H    multivitamin  15 mL Per Tube Daily   mouth rinse  15 mL Mouth Rinse 4 times per day   oxyCODONE  5 mg Per Tube Q8H   polyethylene glycol  17 g Per Tube Daily   pramipexole  0.125 mg Per Tube TID   Continuous Infusions:  feeding supplement (VITAL 1.5 CAL) 55 mL/hr at 10/26/23 0501     LOS: 8 days   Burnadette Pop, MD [facility] P3/14/2025, 7:42 AM

## 2023-10-26 NOTE — TOC Progression Note (Signed)
 Transition of Care [facility]) - Progression Note    Patient Details  Name: Garrett Ferguson MRN: [id number] Date of Birth: 11-Feb-1946  Transition of Care [facility] CM/SW Contact  Beckie Busing, RN Phone Number:[phone]  10/26/2023, 12:47 PM  Clinical Narrative:    TOC continues to follow. Patient is currently not medically stable and plan not clearly determined to initiate SNF workup. Will follow progression. Currently remains on IV diuresis, tube feeds and palliative following with goal to continue current care but family opting for comfort care if further declines.      Barriers to Discharge: Continued Medical Work up  Expected Discharge Plan and Services In-house Referral: NA Discharge Planning Services: NA   Living arrangements for the past 2 months: Single Family Home                   DME Agency: NA       HH Arranged: NA           Social Determinants of Health (SDOH) Interventions SDOH Screenings   Food Insecurity: Patient Unable To Answer (10/21/2023)  Housing: Patient Unable To Answer (10/21/2023)  Transportation Needs: Patient Unable To Answer (10/21/2023)  Utilities: Patient Unable To Answer (10/21/2023)  Financial Resource Strain: Medium Risk (04/06/2021)   Received from [facility]  Physical Activity: Insufficiently Active (02/23/2021)   Received from [facility]  Social Connections: Patient Unable To Answer (10/21/2023)  Stress: No Stress Concern Present (02/23/2021)   Received from [facility], [facility]  Tobacco Use: Low Risk  (10/18/2023)  Health Literacy: Low Risk  (02/23/2021)   Received from [facility], [facility]    Readmission Risk Interventions    10/22/2023    1:09 PM  Readmission Risk Prevention Plan  Transportation Screening Complete  PCP or Specialist Appt within 5-7 Days Complete  Home Care Screening Complete  Medication Review (RN CM) Complete

## 2023-10-26 NOTE — Evaluation (Signed)
 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   GERD (gastroesophageal reflux disease)    H/O: rheumatic fever    age 81   Hearing loss    History of ulcer disease 04/14/1969   Hypertension    Incontinence of urine    Restless leg syndrome    Restless legs syndrome (RLS) 08/08/2013   RSD (reflex sympathetic dystrophy)    L hand   Past Surgical History:  Past Surgical History:  Procedure Laterality Date   BLADDER SURGERY     transitional cell carcinoma   carpall tunnel     L hand   CATARACT EXTRACTION W/PHACO Right 02/23/2023   Procedure: CATARACT EXTRACTION PHACO AND INTRAOCULAR LENS PLACEMENT (IOC);  Surgeon: Pecolia Ades, MD;  Location: [facility];  Service: Ophthalmology;  Laterality: Right;  CDE 11.94   CHOLECYSTECTOMY  08/14/1977   FOOT SURGERY     x5   JOINT REPLACEMENT Right replacements x3   l knee with multiple surg on same knee   NASAL SINUS SURGERY  04/14/1989   PROSTATE BIOPSY     TOTAL KNEE REVISION  04/23/2012   Procedure: TOTAL KNEE REVISION;  Surgeon: Eulas Post, MD;  Location: [facility];  Service: Orthopedics;  Laterality: Left;   TRANSURETHRAL RESECTION OF PROSTATE     HPI:  pt is a 78 yo male with h/o Bladder cancer, HTN, RLS, Chronic Lymphedema LE, PAD, CKD, GERD who had a fall at home (had fall night before as well but EMS checked his vitals and did not find him to a candidate for hospital), BIBA to  [facility].  Verbal at home but then had a seizure.  Seizure lasting about 2 min per EMS, BG 109.  EMS placed in on 100% NRM and in the ED he had a nasal trumpet placed.  He was confused and agitated. 3/6-3/12 intubation,  tried weaning 3/10 back to full support, Stable cardiomegaly with left greater than right lung base opacity, could be a combination of small pleural effusions, atelectasis, infection. 2. Pulmonary vascularity not significantly changed. Visible line and tube position not significantly changed. CXR concerning for potential infection.  Pt failed Yale and swallow eval was ordered.  Per CCM, he will not be intubated again.    Assessment / Plan / Recommendation  Clinical Impression  Patient presents with functional oropharyngeal swallow ability based on clinical swallow evaluation.  His voice is slightly weaker than normal per his statement but otherwise clear.  Patient does appear with slight right facial asymmetry which may be baseline for him.  He has dentures but does not use them to eat, did not want them and currently, therefore asked him to have his wife take him home.  Today patient was able to pass 3 ounce Yale swallow screen easily.  He was also able to adequately masticate moisten graham cracker and consume applesauce with clinically judged  timely swallow and full oral clearance.  Patient denies dysphagia at baseline and eat softer foods at home without dentures.  Regular diet advised to allow patient to choose foods that he would manage eating per his wishes.  Of note he does have significant discomfort with repositioning upright but was willing to be repositioned for optimal p.o. safety. SLP Visit Diagnosis: Dysphagia, unspecified (R13.10)    Aspiration Risk  No limitations    Diet Recommendation Regular;Nectar-thick liquid (Will ask for minced meats due to patient's lack of denture usage)    Liquid Administration via: Cup;Straw Medication Administration: Other (Comment) (As  tolerated) Supervision: Comment (Assist needed to physically self-feed) Compensations: Slow rate;Small sips/bites Postural Changes: Seated upright at 90 degrees;Remain upright for at least 30 minutes after po intake    Other  Recommendations Oral Care Recommendations: Oral care BID    Recommendations for follow up therapy are one component of a multi-disciplinary discharge planning process, led by the attending physician.  Recommendations may be updated based on patient status, additional functional criteria and insurance authorization.  Follow up Recommendations No SLP follow up      Assistance Recommended at Discharge  N/a  Functional Status Assessment Patient has had a recent decline in their functional status and demonstrates the ability to make significant improvements in function in a reasonable and predictable amount of time.  Frequency and Duration   N/a         Prognosis   N/a     Swallow Study   General Date of Onset: 10/26/23 HPI: pt is a 78 yo male with h/o Bladder cancer, HTN, RLS, Chronic Lymphedema LE, PAD, CKD, GERD who had a fall at home (had fall night before as well but EMS checked his vitals and did not find him to a candidate for hospital), BIBA to [facility].  Verbal at home but then had a seizure.  Seizure lasting about 2 min per EMS, BG 109.  EMS placed in on 100% NRM and in the ED he had a nasal trumpet placed.  He was confused and agitated. 3/6-3/12 intubation,  tried weaning 3/10 back to full support, Stable cardiomegaly with left greater than right lung base opacity, could be a combination of small pleural effusions, atelectasis, infection. 2. Pulmonary vascularity not significantly changed. Visible line and tube position not significantly changed. CXR concerning for potential infection.  Pt failed Yale and swallow eval was ordered.  Per CCM, he will not be intubated again. Type of Study: Bedside Swallow Evaluation Diet Prior to this Study: NPO;Cortrak/Small  bore NG tube Temperature Spikes Noted: No Respiratory Status: Nasal cannula History of Recent Intubation: Yes Total duration of intubation (days): 7 days Date extubated: 10/24/23 Behavior/Cognition: Alert;Cooperative;Pleasant mood Oral Cavity Assessment: Dry Oral Care Completed by SLP: Yes Oral Cavity - Dentition:  (Has dentures but reports he does not use them) Vision: Impaired for self-feeding (Right arm significantly weak at this time) Self-Feeding Abilities: Needs assist Patient Positioning: Upright in bed Baseline Vocal Quality: Low vocal intensity Volitional Cough: Strong Volitional Swallow: Able to elicit    Oral/Motor/Sensory Function Overall Oral Motor/Sensory Function: Within functional limits   Ice Chips Ice chips: Within functional limits   Thin Liquid Thin Liquid: Within functional limits Presentation: Straw;Spoon    Nectar Thick Nectar Thick Liquid: Not tested   Honey Thick Honey Thick Liquid: Not tested   Puree Puree: Within functional limits Presentation: Self Fed;Spoon   Solid     Solid: Within functional limits Presentation: Self Fed  Chales Abrahams 10/26/2023,9:21 AM   Rolena Infante, MS [facility] SLP Acute Rehab Services Office [phone]

## 2023-10-26 NOTE — Plan of Care (Signed)
 Patient progressing well today.  Diet advanced and removed cortrak today.  Vitals remained stable.

## 2023-10-26 NOTE — Progress Notes (Signed)
  Daily Progress Note   Patient Name: Garrett Ferguson       Date: 10/26/2023 DOB: 1946-06-19  Age: 78 y.o. MRN#: [id number] Attending Physician: Burnadette Pop, MD Primary Care Physician: Care, [place] Primary Admit Date: 10/18/2023 Length of Stay: 8 days  Reason for Consultation/Follow-up: Establishing goals of care  Subjective:   CC: Patient resting comfortably.  Following up regarding complex medical decision making.  Subjective:  Reviewed EMR prior to seeing patient.  Discussed care with SLP for updates today.  Patient passed swallow eval and has been placed on regular diet.  Patient did work with physical therapy yesterday though needing max/total assist.  Presented to bedside to see patient.  Discussed care with RN who was present at bedside as well.  Patient was sleeping comfortably when seen today so did not awaken.  No family present at bedside.   Objective:   Vital Signs:  BP (!) 131/51   Pulse 73   Temp 98.5 F (36.9 C) (Axillary)   Resp (!) 29   Ht 5\' 11"  (1.803 m)   Wt 100 kg Comment: accounted for 2kg of blankets, 1kg prevalon boots  SpO2 94%   BMI 30.75 kg/m   Physical Exam: General: Sleeping comfortably, chronically ill-appearing  (hard of hearing as well) Cardiovascular: RRR, edema in the lower extremities bilaterally Respiratory: O2 support with nasal cannula, no increased work of breathing noted Skin: Chronic venous stasis changes in lower extremities bilaterally  Imaging: I personally reviewed recent imaging.   Assessment & Plan:   Assessment: Patient is an 78 year old male with a past medical history of bladder cancer, hypertension, RLS, chronic pain, chronic lymphedema in lower extremities bilaterally, PAD, CKD, and GERD who was transferred from [facility] on 10/18/2023 after a fall at home when was noted had seizure.  Since admission, patient has received management for acute metabolic encephalopathy, acute hypoxic respiratory failure requiring  intubation with mechanical ventilation support secondary to pneumonia, and acute onset A-fib with RVR.  Palliative medicine team consulted to assist with complex medical decision making.  Recommendations/Plan: # Complex medical decision making/goals of care:  - Continuing appropriate medical interventions at this time.  Patient having slight improvements in mentation and respiratory status daily.  Patient still has overlying chronic medical conditions.  Allowing time for outcomes.  Palliative medicine team continuing to follow along to engage in conversations as able. Please reach out if acute PMT needs arise.   -  Code Status: Limited: Do not attempt resuscitation (DNR) -DNR-LIMITED -Do Not Intubate/DNI   # Psychosocial Support:  -Wife, son  # Discharge Planning: To Be Determined  Discussed with: RN, SLP  Thank you for allowing the palliative care team to participate in the care Bari Mantis.  Alvester Morin, DO Palliative Care Provider PMT # [phone]  If patient remains symptomatic despite maximum doses, please call PMT at [phone] between 0700 and 1900. Outside of these hours, please call attending, as PMT does not have night coverage.

## 2023-10-26 NOTE — Plan of Care (Signed)
  Problem: Clinical Measurements: Goal: Diagnostic test results will improve Outcome: Progressing Goal: Signs and symptoms of infection will decrease Outcome: Progressing   Problem: Respiratory: Goal: Ability to maintain adequate ventilation will improve Outcome: Progressing   Problem: Clinical Measurements: Goal: Ability to maintain clinical measurements within normal limits will improve Outcome: Progressing Goal: Diagnostic test results will improve Outcome: Progressing Goal: Respiratory complications will improve Outcome: Progressing Goal: Cardiovascular complication will be avoided Outcome: Progressing

## 2023-10-27 ENCOUNTER — Inpatient Hospital Stay (HOSPITAL_COMMUNITY)

## 2023-10-27 DIAGNOSIS — I480 Paroxysmal atrial fibrillation: Secondary | ICD-10-CM

## 2023-10-27 DIAGNOSIS — R5381 Other malaise: Secondary | ICD-10-CM

## 2023-10-27 DIAGNOSIS — J189 Pneumonia, unspecified organism: Secondary | ICD-10-CM

## 2023-10-27 DIAGNOSIS — R6521 Severe sepsis with septic shock: Secondary | ICD-10-CM | POA: Diagnosis not present

## 2023-10-27 DIAGNOSIS — A419 Sepsis, unspecified organism: Secondary | ICD-10-CM | POA: Diagnosis not present

## 2023-10-27 LAB — GLUCOSE, CAPILLARY
Glucose-Capillary: 101 mg/dL — ABNORMAL HIGH (ref 70–99)
Glucose-Capillary: 113 mg/dL — ABNORMAL HIGH (ref 70–99)

## 2023-10-27 LAB — BASIC METABOLIC PANEL
Anion gap: 9 (ref 5–15)
BUN: 28 mg/dL — ABNORMAL HIGH (ref 8–23)
CO2: 28 mmol/L (ref 22–32)
Calcium: 8.6 mg/dL — ABNORMAL LOW (ref 8.9–10.3)
Chloride: 101 mmol/L (ref 98–111)
Creatinine, Ser: 0.62 mg/dL (ref 0.61–1.24)
GFR, Estimated: >60 mL/min (ref >60)
Glucose, Bld: 96 mg/dL (ref 70–99)
Potassium: 4.1 mmol/L (ref 3.5–5.1)
Sodium: 138 mmol/L (ref 135–145)

## 2023-10-27 LAB — CBC
HCT: 33.8 % — ABNORMAL LOW (ref 39.0–52.0)
Hemoglobin: 10.4 g/dL — ABNORMAL LOW (ref 13.0–17.0)
MCH: 29.5 pg (ref 26.0–34.0)
MCHC: 30.8 g/dL (ref 30.0–36.0)
MCV: 95.8 fL (ref 80.0–100.0)
Platelets: 301 10*3/uL (ref 150–400)
RBC: 3.53 MIL/uL — ABNORMAL LOW (ref 4.22–5.81)
RDW: 14.9 % (ref 11.5–15.5)
WBC: 9.7 10*3/uL (ref 4.0–10.5)
nRBC: 0 % (ref 0.0–0.2)

## 2023-10-27 MED ORDER — ENSURE ENLIVE PO LIQD
237.0000 mL | Freq: Two times a day (BID) | ORAL | Status: DC
  Administered 2023-10-27 – 2023-11-02 (×12): 237 mL via ORAL

## 2023-10-27 MED ORDER — ORAL CARE MOUTH RINSE: 15.0000 mL | OROMUCOSAL | Status: DC | PRN

## 2023-10-27 NOTE — Progress Notes (Signed)
 Progress Note    Garrett Ferguson   YQM:[id number]  DOB: Apr 23, 1946  DOA: 10/18/2023     9 PCP: Care, [place] Primary  Initial CC: fall at home   Hospital Course: Mr. Cederberg is a 78 year old male with history of bladder cancer,HTN,restless leg syndrome, chronic lymphedema of B/L lower extremity, peripheral artery disease, GERD who was brought initially to [facility] after he fell at home.  EMS found him to be seizing.  He was confused and agitated afterwards, hypothermic, hypotensive then became unresponsive.   On presentation, UA was suspicious for UTI.  Sepsis protocol started.  Transferred to [facility] long ICU.  Code called for respiratory arrest that started on vasopressors, intubated.  No loss of pulse or CPR done.  Palliative care consulted for goals of care discussion.  Currently DNR.  Patient transferred to [facility] service on 3/14.  Clinically improving.  PT/OT recommending SNF on discharge.   Important events: 3/6 tx from [facility] ER to [facility], urosepsis, AMS, intubated on arrival  3/7 difficulty placing foley, ended up having trauma and urology was consulted 3/9 Off of pressors. Documented SBT x 9 hours. made DNR w palliative care.  3/10 Fib RVR w SBT; back to full support, NSR w amio gtt. Abx to doxy rocephin  3/11 fever, ongoing pressors. Weaned for a few hours in the late afternoon but was pretty tired.  3/12 one-way extubation, DNR/ DNI Weaned to room air   A&P:  Acute hypoxic respiratory failure - Suspected to be from community-acquired pneumonia, septic shock.  Initially intubated, now extubated and on nasal cannula.   Goal is to wean oxygen.  Continue pulmonary hygiene, flutter valve, incentive spirometer, bronchodilators as needed. As per goals of care discussion, okay for BiPAP if needed but transition to comfort care if continues to decline. - remains on RA   Septic shock - resolved  Thought to be secondary to pneumonia, possible left lower extremity cellulitis.Completed  7 days course of doxycycline and ceftriaxone on 3/12.  Cultures have remained negative so far.   Dysphagia - Speech therapy following.  Started on regular diet    A-fib with RVR - Went into rapid A-fib on 3/10 but spontaneously converted to normal sinus rhythm with amiodarone.  - This was most likely associated with acute respiratory distress.   - Currently on aspirin, statin.  Anticoagulation not felt to be indicated as afib precipitated in setting of acute illness/shock   Hematuria - Likely from traumatic catheterization.  Foley placed by urology on 3/8.  Recommendation is to continue for 7 days before restarting voiding trial.   - Voiding trial on 3/16   Normocytic anemia - Currently stable.  Continue to monitor   History of chronic lymphedema - Continue supportive care, wound care was consulted   Frequent falls/physical deconditioning - Mostly wheelchair dependent at home since March 15, 2023.  PT/OT consulted.  SNF recommended   Hyperglycemia - Currently on sliding scale.  A1c of 5.8.  Blood sugar stable now   History of restless leg syndrome/chronic pain syndrome/anxiety/depression - On as needed oxycodone, hydroxyzine   Goals of care - Palliative care following.  DNR/DNI - Goal is to continue current care but family opting for comfort care if further declines.  Patient is clinically improving.  Interval History:  No events overnight.  Resting comfortably in bed when seen this morning.  Tolerating diet well.  Had no concerns.  Has been weaned to room air.    Old records reviewed in assessment  of this patient   DVT prophylaxis:  heparin injection 5,000 Units Start: 10/18/23 2200 SCDs Start: 10/18/23 2032 TED hose Start: 10/18/23 1442   Code Status:   Code Status: Limited: Do not attempt resuscitation (DNR) -DNR-LIMITED -Do Not Intubate/DNI   Mobility Assessment (Last 72 Hours)     Mobility Assessment     Row Name 10/26/23 2000 10/25/23 2000 10/25/23 1414  10/25/23 1148     What is the highest level of mobility based on the progressive mobility assessment? Level 1 (Bedfast) - Unable to balance while sitting on edge of bed Level 1 (Bedfast) - Unable to balance while sitting on edge of bed Level 1 (Bedfast) - Unable to balance while sitting on edge of bed Level 1 (Bedfast) - Unable to balance while sitting on edge of bed    Is the above level different from baseline mobility prior to current illness? Yes - Recommend PT order Yes - Recommend PT order -- --             Barriers to discharge: none Disposition Plan:  SNF Status is: Inpt  Objective: Blood pressure (!) 130/45, pulse 63, temperature 98.8 F (37.1 C), temperature source Oral, resp. rate (!) 29, height 5\' 11"  (1.803 m), weight 100.2 kg, SpO2 96%.  Examination:  Physical Exam Constitutional:      General: He is not in acute distress.    Appearance: Normal appearance.  HENT:     Head: Normocephalic and atraumatic.     Mouth/Throat:     Mouth: Mucous membranes are moist.  Eyes:     Extraocular Movements: Extraocular movements intact.  Cardiovascular:     Rate and Rhythm: Normal rate and regular rhythm.  Pulmonary:     Effort: Pulmonary effort is normal. No respiratory distress.     Breath sounds: Normal breath sounds. No wheezing.  Abdominal:     General: Bowel sounds are normal. There is no distension.     Palpations: Abdomen is soft.     Tenderness: There is no abdominal tenderness.  Musculoskeletal:        General: Swelling (chronic LE lymphedema; B/L UE edema noted) present.     Cervical back: Normal range of motion and neck supple.  Skin:    General: Skin is warm and dry.  Neurological:     General: No focal deficit present.     Mental Status: He is alert.  Psychiatric:        Mood and Affect: Mood normal.        Behavior: Behavior normal.      Consultants:    Procedures:    Data Reviewed: Results for orders placed or performed during the hospital  encounter of 10/18/23 (from the past 24 hours)  Glucose, capillary     Status: Abnormal   Collection Time: 10/26/23 11:57 AM  Result Value Ref Range   Glucose-Capillary 103 (H) 70 - 99 mg/dL  Glucose, capillary     Status: Abnormal   Collection Time: 10/26/23  3:53 PM  Result Value Ref Range   Glucose-Capillary 109 (H) 70 - 99 mg/dL  Glucose, capillary     Status: Abnormal   Collection Time: 10/26/23  9:08 PM  Result Value Ref Range   Glucose-Capillary 110 (H) 70 - 99 mg/dL  CBC     Status: Abnormal   Collection Time: 10/27/23  5:44 AM  Result Value Ref Range   WBC 9.7 4.0 - 10.5 K/uL   RBC 3.53 (L) 4.22 - 5.81 MIL/uL  Hemoglobin 10.4 (L) 13.0 - 17.0 g/dL   HCT 09.8 (L) 11.9 - 14.7 %   MCV 95.8 80.0 - 100.0 fL   MCH 29.5 26.0 - 34.0 pg   MCHC 30.8 30.0 - 36.0 g/dL   RDW 82.9 56.2 - 13.0 %   Platelets 301 150 - 400 K/uL   nRBC 0.0 0.0 - 0.2 %  Basic metabolic panel     Status: Abnormal   Collection Time: 10/27/23  5:44 AM  Result Value Ref Range   Sodium 138 135 - 145 mmol/L   Potassium 4.1 3.5 - 5.1 mmol/L   Chloride 101 98 - 111 mmol/L   CO2 28 22 - 32 mmol/L   Glucose, Bld 96 70 - 99 mg/dL   BUN 28 (H) 8 - 23 mg/dL   Creatinine, Ser 8.65 0.61 - 1.24 mg/dL   Calcium 8.6 (L) 8.9 - 10.3 mg/dL   GFR, Estimated >78 >46 mL/min   Anion gap 9 5 - 15  Glucose, capillary     Status: Abnormal   Collection Time: 10/27/23  7:53 AM  Result Value Ref Range   Glucose-Capillary 101 (H) 70 - 99 mg/dL   Comment 1 Notify RN    Comment 2 Document in Chart     I have reviewed pertinent nursing notes, vitals, labs, and images as necessary. I have ordered labwork to follow up on as indicated.  I have reviewed the last notes from staff over past 24 hours. I have discussed patient's care plan and test results with nursing staff, CM/SW, and other staff as appropriate.  Time spent: Greater than 50% of the 55 minute visit was spent in counseling/coordination of care for the patient as laid  out in the A&P.   LOS: 9 days   Lewie Chamber, MD [facility] 10/27/2023, 11:38 AM

## 2023-10-27 NOTE — Plan of Care (Signed)
 Patient up to chair today.  Doing some range of motion.  He is doing well but a lot of joint pain.

## 2023-10-27 NOTE — Plan of Care (Signed)
  Problem: Clinical Measurements: Goal: Diagnostic test results will improve Outcome: Progressing Goal: Signs and symptoms of infection will decrease Outcome: Progressing   Problem: Respiratory: Goal: Ability to maintain adequate ventilation will improve Outcome: Progressing   Problem: Clinical Measurements: Goal: Ability to maintain clinical measurements within normal limits will improve Outcome: Progressing Goal: Will remain free from infection Outcome: Progressing Goal: Diagnostic test results will improve Outcome: Progressing Goal: Respiratory complications will improve Outcome: Progressing   Problem: Activity: Goal: Risk for activity intolerance will decrease Outcome: Progressing   Problem: Nutrition: Goal: Adequate nutrition will be maintained Outcome: Progressing   Problem: Coping: Goal: Level of anxiety will decrease Outcome: Progressing

## 2023-10-27 NOTE — Progress Notes (Signed)
 eLink Physician-Brief Progress Note Patient Name: Garrett Ferguson DOB: [birth year] MRN: [id number]   Date of Service  10/27/2023  HPI/Events of Note  78 year old male with history of bladder cancer,HTN,, restless leg syndrome, chronic lymphedema of B/L  lower extremity, peripheral artery disease, GERD who was brought initially to [facility] after he fell at home .  Resume regular diet, still on every 4 hours insulin checks.  Glucose is within goal.  eICU Interventions  DC SSI and CBG checks     Intervention Category Intermediate Interventions: Hyperglycemia - evaluation and treatment  Winnie Umali 10/27/2023, 12:36 AM

## 2023-10-28 DIAGNOSIS — J189 Pneumonia, unspecified organism: Secondary | ICD-10-CM

## 2023-10-28 DIAGNOSIS — A419 Sepsis, unspecified organism: Secondary | ICD-10-CM | POA: Diagnosis not present

## 2023-10-28 DIAGNOSIS — R310 Gross hematuria: Secondary | ICD-10-CM

## 2023-10-28 DIAGNOSIS — N3000 Acute cystitis without hematuria: Secondary | ICD-10-CM | POA: Diagnosis not present

## 2023-10-28 DIAGNOSIS — R5381 Other malaise: Secondary | ICD-10-CM

## 2023-10-28 DIAGNOSIS — J9601 Acute respiratory failure with hypoxia: Secondary | ICD-10-CM | POA: Diagnosis not present

## 2023-10-28 MED ORDER — GABAPENTIN 100 MG PO CAPS
100.0000 mg | ORAL_CAPSULE | Freq: Two times a day (BID) | ORAL | Status: DC
  Administered 2023-10-28 – 2023-11-03 (×13): 100 mg via ORAL
  Filled 2023-10-28 (×13): qty 1

## 2023-10-28 MED ORDER — PRAMIPEXOLE DIHYDROCHLORIDE 0.125 MG PO TABS
0.1250 mg | ORAL_TABLET | Freq: Three times a day (TID) | ORAL | Status: DC
  Administered 2023-10-28 – 2023-11-03 (×18): 0.125 mg via ORAL
  Filled 2023-10-28 (×20): qty 1

## 2023-10-28 NOTE — Progress Notes (Signed)
 Progress Note    Garrett Ferguson   IEP:[id number]  DOB: 1945/10/06  DOA: 10/18/2023     10 PCP: [facility]  Initial CC: fall at home   Hospital Course: Garrett Ferguson is a 78 year old male with history of bladder cancer,HTN,restless leg syndrome, chronic lymphedema of B/L lower extremity, peripheral artery disease, GERD who was brought initially to [facility] after he fell at home.  EMS found him to be seizing.  He was confused and agitated afterwards, hypothermic, hypotensive then became unresponsive.   On presentation, UA was suspicious for UTI.  Sepsis protocol started.  Transferred to [facility] long ICU.  Code called for respiratory arrest that started on vasopressors, intubated.  No loss of pulse or CPR done.  Palliative care consulted for goals of care discussion.  Currently DNR.  Patient transferred to [facility] service on 3/14.  Clinically improving.  PT/OT recommending SNF on discharge.   Important events: 3/6 tx from [facility] ER to [facility], urosepsis, AMS, intubated on arrival  3/7 difficulty placing foley, ended up having trauma and urology was consulted 3/9 Off of pressors. Documented SBT x 9 hours. made DNR w palliative care.  3/10 Fib RVR w SBT; back to full support, NSR w amio gtt. Abx to doxy rocephin  3/11 fever, ongoing pressors. Weaned for a few hours in the late afternoon but was pretty tired.  3/12 one-way extubation, DNR/ DNI Weaned to room air   A&P:  Acute hypoxic respiratory failure - Suspected to be from community-acquired pneumonia, septic shock.  Initially intubated, now extubated and on nasal cannula.   Goal is to wean oxygen.  Continue pulmonary hygiene, flutter valve, incentive spirometer, bronchodilators as needed. As per goals of care discussion, okay for BiPAP if needed but transition to comfort care if continues to decline. - remains on RA   Septic shock - resolved  Thought to be secondary to pneumonia, possible left lower extremity cellulitis.Completed  7 days course of doxycycline and ceftriaxone on 3/12.  Cultures have remained negative so far.   Dysphagia - Speech therapy following.  Started on regular diet    A-fib with RVR - resolved  - Went into rapid A-fib on 3/10 but spontaneously converted to normal sinus rhythm with amiodarone.  - This was most likely associated with acute respiratory distress.   - Currently on aspirin, statin.  Anticoagulation not felt to be indicated as afib precipitated in setting of acute illness/shock   Hematuria - Likely from traumatic catheterization.  Foley placed by urology on 3/8.  Recommendation is to continue for at least 7 days before restarting voiding trial.   - Voiding trial no sooner than 3/16; let's keep another couple days as he's just now starting to recover steadily   Normocytic anemia - Currently stable.  Continue to monitor   History of chronic lymphedema - Continue supportive care, wound care was consulted   Frequent falls/physical deconditioning - Mostly wheelchair dependent at home since March 15, 2023.  PT/OT consulted.  SNF recommended   Hyperglycemia - Currently on sliding scale.  A1c of 5.8.  Blood sugar stable now   History of restless leg syndrome/chronic pain syndrome/anxiety/depression - On as needed oxycodone, hydroxyzine   Goals of care - Palliative care following.  DNR/DNI - Goal is to continue current care but family opting for comfort care if further declines.  Patient is clinically improving.  Interval History:  No events overnight.  Remains on RA and now steadily doing better the past couple  days.  Foley remains in and we'll give another 1-2 days before trial of pulling.   Old records reviewed in assessment of this patient   DVT prophylaxis:  heparin injection 5,000 Units Start: 10/18/23 2200 SCDs Start: 10/18/23 2032 TED hose Start: 10/18/23 1442   Code Status:   Code Status: Limited: Do not attempt resuscitation (DNR) -DNR-LIMITED -Do Not Intubate/DNI    Mobility Assessment (Last 72 Hours)     Mobility Assessment     Row Name 10/28/23 0800 10/27/23 2000 10/26/23 2000 10/25/23 2000 10/25/23 1414   What is the highest level of mobility based on the progressive mobility assessment? Level 1 (Bedfast) - Unable to balance while sitting on edge of bed Level 1 (Bedfast) - Unable to balance while sitting on edge of bed Level 1 (Bedfast) - Unable to balance while sitting on edge of bed Level 1 (Bedfast) - Unable to balance while sitting on edge of bed Level 1 (Bedfast) - Unable to balance while sitting on edge of bed   Is the above level different from baseline mobility prior to current illness? Yes - Recommend PT order Yes - Recommend PT order Yes - Recommend PT order Yes - Recommend PT order --    Row Name 10/25/23 1148           What is the highest level of mobility based on the progressive mobility assessment? Level 1 (Bedfast) - Unable to balance while sitting on edge of bed                Barriers to discharge: none Disposition Plan:  SNF Status is: Inpt  Objective: Blood pressure (!) 147/60, pulse 72, temperature 98.3 F (36.8 C), temperature source Oral, resp. rate (!) 23, height 5\' 11"  (1.803 m), weight 100.1 kg, SpO2 98%.  Examination:  Physical Exam Constitutional:      General: He is not in acute distress.    Appearance: Normal appearance.  HENT:     Head: Normocephalic and atraumatic.     Mouth/Throat:     Mouth: Mucous membranes are moist.  Eyes:     Extraocular Movements: Extraocular movements intact.  Cardiovascular:     Rate and Rhythm: Normal rate and regular rhythm.  Pulmonary:     Effort: Pulmonary effort is normal. No respiratory distress.     Breath sounds: Normal breath sounds. No wheezing.  Abdominal:     General: Bowel sounds are normal. There is no distension.     Palpations: Abdomen is soft.     Tenderness: There is no abdominal tenderness.  Musculoskeletal:        General: Swelling (chronic LE  lymphedema; B/L UE edema noted) present.     Cervical back: Normal range of motion and neck supple.  Skin:    General: Skin is warm and dry.  Neurological:     General: No focal deficit present.     Mental Status: He is alert.  Psychiatric:        Mood and Affect: Mood normal.        Behavior: Behavior normal.      Consultants:    Procedures:    Data Reviewed: Results for orders placed or performed during the hospital encounter of 10/18/23 (from the past 24 hours)  Glucose, capillary     Status: Abnormal   Collection Time: 10/27/23 11:51 AM  Result Value Ref Range   Glucose-Capillary 113 (H) 70 - 99 mg/dL    I have reviewed pertinent nursing notes, vitals, labs,  and images as necessary. I have ordered labwork to follow up on as indicated.  I have reviewed the last notes from staff over past 24 hours. I have discussed patient's care plan and test results with nursing staff, CM/SW, and other staff as appropriate.  Time spent: Greater than 50% of the 55 minute visit was spent in counseling/coordination of care for the patient as laid out in the A&P.   LOS: 10 days   Lewie Chamber, MD [facility] 10/28/2023, 11:08 AM

## 2023-10-28 NOTE — Plan of Care (Signed)
  Problem: Clinical Measurements: Goal: Diagnostic test results will improve Outcome: Progressing Goal: Signs and symptoms of infection will decrease Outcome: Progressing   Problem: Respiratory: Goal: Ability to maintain adequate ventilation will improve Outcome: Progressing   Problem: Education: Goal: Knowledge of General Education information will improve Description: Including pain rating scale, medication(s)/side effects and non-pharmacologic comfort measures Outcome: Progressing   Problem: Clinical Measurements: Goal: Ability to maintain clinical measurements within normal limits will improve Outcome: Progressing Goal: Will remain free from infection Outcome: Progressing Goal: Diagnostic test results will improve Outcome: Progressing Goal: Respiratory complications will improve Outcome: Progressing Goal: Cardiovascular complication will be avoided Outcome: Progressing   Problem: Activity: Goal: Risk for activity intolerance will decrease Outcome: Progressing

## 2023-10-28 NOTE — Plan of Care (Signed)
 Discussed with patient plan of care for the evening, pain management and food with some teach back displayed.  Problem: Education: Goal: Knowledge of General Education information will improve Description: Including pain rating scale, medication(s)/side effects and non-pharmacologic comfort measures Outcome: Progressing   Problem: Health Behavior/Discharge Planning: Goal: Ability to manage health-related needs will improve Outcome: Not Progressing   Problem: Coping: Goal: Level of anxiety will decrease Outcome: Progressing   Problem: Pain Managment: Goal: General experience of comfort will improve and/or be controlled Outcome: Progressing

## 2023-10-29 DIAGNOSIS — N3000 Acute cystitis without hematuria: Secondary | ICD-10-CM | POA: Diagnosis not present

## 2023-10-29 DIAGNOSIS — R4589 Other symptoms and signs involving emotional state: Secondary | ICD-10-CM | POA: Diagnosis not present

## 2023-10-29 DIAGNOSIS — R5381 Other malaise: Secondary | ICD-10-CM | POA: Diagnosis not present

## 2023-10-29 DIAGNOSIS — J189 Pneumonia, unspecified organism: Secondary | ICD-10-CM | POA: Diagnosis not present

## 2023-10-29 DIAGNOSIS — A419 Sepsis, unspecified organism: Secondary | ICD-10-CM | POA: Diagnosis not present

## 2023-10-29 DIAGNOSIS — I890 Lymphedema, not elsewhere classified: Secondary | ICD-10-CM | POA: Diagnosis not present

## 2023-10-29 DIAGNOSIS — Z515 Encounter for palliative care: Secondary | ICD-10-CM | POA: Diagnosis not present

## 2023-10-29 DIAGNOSIS — J9601 Acute respiratory failure with hypoxia: Secondary | ICD-10-CM | POA: Diagnosis not present

## 2023-10-29 MED ORDER — FENTANYL CITRATE PF 50 MCG/ML IJ SOSY: 12.5000 ug | PREFILLED_SYRINGE | Freq: Once | INTRAMUSCULAR | Status: DC | PRN

## 2023-10-29 MED ORDER — PREDNISOLONE ACETATE 1 % OP SUSP
1.0000 [drp] | Freq: Three times a day (TID) | OPHTHALMIC | Status: DC
  Administered 2023-10-29 – 2023-11-03 (×15): 1 [drp] via OPHTHALMIC
  Filled 2023-10-29: qty 5

## 2023-10-29 MED ORDER — NAPHAZOLINE-GLYCERIN 0.012-0.25 % OP SOLN
2.0000 [drp] | Freq: Four times a day (QID) | OPHTHALMIC | Status: DC
  Administered 2023-10-29 – 2023-11-03 (×19): 2 [drp] via OPHTHALMIC
  Filled 2023-10-29: qty 15

## 2023-10-29 MED FILL — Fentanyl Citrate-NaCl 0.9% IV Soln 2.5 MG/250ML: INTRAVENOUS | Qty: 250 | Status: AC

## 2023-10-29 NOTE — Progress Notes (Signed)
 Physical Therapy Treatment Patient Details Name: Garrett Ferguson MRN: [id number] DOB: 1945-12-15 Today's Date: 10/29/2023   History of Present Illness 78 yo male brought to ED via  EMS on 10/18/23 with  falls, seizure, hypotension, hypothermia, sepsis, respiratory arrest requiring uintubation. Extubated 10/24/23. WUJ:WJXBJYN cancer, HTN, RLS, Chronic Lymphedema LE, PAD, CKD, GERD, L hand RSD, L TKA x 3, TURP    PT Comments  The patient is reporting significant pain in the  left knee and behind to knee. Adjusted the prevalon boot and repositioned the leg and placed and ice pack on the left knee. Patient  unable to tolerate mobility at this time.  Continue PT and mobility as tolerated.   If plan is discharge home, recommend the following: Two people to help with walking and/or transfers;Two people to help with bathing/dressing/bathroom;Help with stairs or ramp for entrance;Assistance with cooking/housework   Can travel by private vehicle        Equipment Recommendations  None recommended by PT    Recommendations for Other Services       Precautions / Restrictions Precautions Precautions: Fall Precaution/Restrictions Comments: severe L knee pain, multiple TKA revisions, lymphadema Restrictions Weight Bearing Restrictions Per Provider Order: No     Mobility  Bed Mobility               General bed mobility comments: repostioned the LLE with pillows, moved  positioner of the Prevalon boot to the inside to decrease pressure on lateral leg.    Transfers                   General transfer comment: unable    Ambulation/Gait                   Stairs             Wheelchair Mobility     Tilt Bed    Modified Rankin (Stroke Patients Only)       Balance       Sitting balance - Comments: unable to mobilize due to reports of LLE pain.                                    Communication Communication Communication: No apparent  difficulties Factors Affecting Communication: Hearing impaired  Cognition Arousal: Alert Behavior During Therapy: WFL for tasks assessed/performed   PT - Cognitive impairments: Memory                       PT - Cognition Comments: Patient appears  in much pain, anxious, difficulty concentrating Following commands: Intact      Cueing    Exercises      General Comments        Pertinent Vitals/Pain Pain Assessment Faces Pain Scale: Hurts worst Pain Location: left knee and popliteal fosa area., repostioned LE on pillows, Pain Descriptors / Indicators: Aching, Tightness, Constant, Grimacing Pain Intervention(s): Limited activity within patient's tolerance, Monitored during session, Patient requesting pain meds-RN notified, Ice applied, Repositioned    Home Living                          Prior Function            PT Goals (current goals can now be found in the care plan section) Progress towards PT goals: Progressing toward goals    Frequency  Min 2X/week      PT Plan      Co-evaluation              AM-PAC PT "6 Clicks" Mobility   Outcome Measure  Help needed turning from your back to your side while in a flat bed without using bedrails?: Total Help needed moving from lying on your back to sitting on the side of a flat bed without using bedrails?: Total Help needed moving to and from a bed to a chair (including a wheelchair)?: Total Help needed standing up from a chair using your arms (e.g., wheelchair or bedside chair)?: Total Help needed to walk in hospital room?: Total Help needed climbing 3-5 steps with a railing? : Total 6 Click Score: 6    End of Session   Activity Tolerance: Patient limited by pain;Patient limited by fatigue Patient left: in bed;with call bell/phone within reach;with bed alarm set;with family/visitor present Nurse Communication: Mobility status;Need for lift equipment PT Visit Diagnosis: Unsteadiness on  feet (R26.81);Muscle weakness (generalized) (M62.81);Pain Pain - Right/Left: Left Pain - part of body: Knee     Time: 7829-5621 PT Time Calculation (min) (ACUTE ONLY): 16 min  Charges:    $Therapeutic Activity: 8-22 mins PT General Charges $$ ACUTE PT VISIT: 1 Visit                     Blanchard Kelch PT Acute Rehabilitation Services Office [phone]     Rada Hay 10/29/2023, 4:41 PM

## 2023-10-29 NOTE — Progress Notes (Signed)
 Nutrition Follow-up  DOCUMENTATION CODES:   Not applicable  INTERVENTION:  - Regular diet.   - Assistance with ordering meals to allow patient to better choose foods he will enjoy. - Ensure Plus High Protein po BID, each supplement provides 350 kcal and 20 grams of protein. - Multivitamin with minerals daily - Monitor weight trends.  NUTRITION DIAGNOSIS:   Increased nutrient needs related to acute illness as evidenced by estimated needs. *new  GOAL:   Patient will meet greater than or equal to 90% of their needs *progressing  MONITOR:   PO intake, Supplement acceptance  REASON FOR ASSESSMENT:   Consult, Ventilator Enteral/tube feeding initiation and management, Assessment of nutrition requirement/status  ASSESSMENT:   78 y.o. with a past medical history of  hypertension, RLS, chronic lymphedema, PAD, CKD, GERD who presented to [facility] last night after suffering a fall.  Report however was that he had a seizure.  He presented the ED agitated hypotensive as well as in respiratory failure.  3/6 Admit; Intubated 3/7 Vital 1.5 initiated @ 20 3/9 Reached goal of Vital 1.5 @ 60  3/10 Cortrak placed (tip in stomach); TF resumed at goal 3/12 One-way extubation; TF resumed via Cortrak 3/13 SLP eval -> pt too lethargic; continuing Cortrak/TF 3/14 SLP eval -> Reg diet; Cortrak removed  Patient awake and watching TV at time of visit. He reports a regular diet os going fairly well. Admits he is not eating a lot each day but is hopeful his appetite will improve soon.  He is documented to be consuming 0-75% of meals with an average of 33%. Thankfully, he is drinking Ensure twice daily and reports really enjoying them.  Patient notes he sometimes gets foods he doesn't really want. Will change patient to "assist" to allow him to choose foods he likes.    Admit weight: 227# Current weight: 215# I&O's: +1.8L since admit + for mild pitting generalized edema and mild pitting  edema to lower extremities   Medications reviewed and include: Dulcolax, Colace, Miralax, MVI  Labs reviewed:  No BMP since 3/15   Diet Order:   Diet Order             Diet regular Room service appropriate? Yes; Fluid consistency: Thin  Diet effective now                   EDUCATION NEEDS:  Education needs have been addressed  Skin:  Skin Assessment: Reviewed RN Assessment  Last BM:  3/17 - type 6  Height:  Ht Readings from Last 1 Encounters:  10/18/23 5\' 11"  (1.803 m)   Weight:  Wt Readings from Last 1 Encounters:  10/29/23 97.8 kg    BMI:  Body mass index is 30.07 kg/m.  Estimated Nutritional Needs:  Kcal:  1900-2100 kcals Protein:  100-115 grams Fluid:  >/= 1.9L    Shelle Iron RD, LDN Contact via Secure Chat.

## 2023-10-29 NOTE — Progress Notes (Signed)
 Daily Progress Note   Patient Name: Garrett Ferguson       Date: 10/29/2023 DOB: Jun 18, 1946  Age: 78 y.o. MRN#: [id number] Attending Physician: Lewie Chamber, MD Primary Care Physician: [facility] [place] [facility] Admit Date: 10/18/2023 Length of Stay: 11 days  Reason for Consultation/Follow-up: Establishing goals of care  Subjective:   CC: Patient enjoying TV while laying in bed.  Following up regarding complex medical decision making.  Subjective:  Reviewed EMR prior to seeing patient.  Discussed care with hospitalist and RN for medical updates.  Presented to bedside to see patient.  Patient laying comfortably in bed watching TV.  No family present at bedside.  Again introduced myself as a member of the palliative medicine team.  Patient's only symptom concern at this time was left eye irritation.  Noted he had discussed this with hospitalist so also explained this provider would follow-up regarding eyedrops for comfort.   Spent time discussing patient's goals for medical care moving forward.  Patient at this time notes he still feels motivated to work with PT/OT to regain the strength he can.  Patient vocalizes he realize he has a long way to go but he wants to try to do so.  Discussed that should patient ever reach a point where he feels that the interventions we are providing are no longer assisting in his overall medical goals for care, could transition to focus on quality comfort time at home with hospice care.  Patient acknowledges this and appreciates the information.  Patient does not feel that he is ready for hospice quite yet though realizes this may be something he needs in the future and he notes that it is an option for him.  Spent time providing emotional support be active listening.  All questions answered at that time.  Discussed care with hospitalist and RN after visit.  Objective:   Vital Signs:  BP (!) 136/50   Pulse 69   Temp 98.5 F (36.9 C) (Oral)   Resp (!) 26   Ht 5'  11" (1.803 m)   Wt 97.8 kg Comment: 100.2-2.4  SpO2 98%   BMI 30.07 kg/m   Physical Exam: General: Awake, laying in bed, chronically ill-appearing  (hard of hearing as well) Cardiovascular: RRR, edema in the lower extremities bilaterally Respiratory: no increased work of breathing noted Skin: Chronic venous stasis changes in lower extremities bilaterally  Imaging: I personally reviewed recent imaging.   Assessment & Plan:   Assessment: Patient is an 78 year old male with a past medical history of bladder cancer, hypertension, RLS, chronic pain, chronic lymphedema in lower extremities bilaterally, PAD, CKD, and GERD who was transferred from [facility] on 10/18/2023 after a fall at home when was noted had seizure.  Since admission, patient has received management for acute metabolic encephalopathy, acute hypoxic respiratory failure requiring intubation with mechanical ventilation support secondary to pneumonia, and acute onset A-fib with RVR.  Palliative medicine team consulted to assist with complex medical decision making.  Recommendations/Plan: # Complex medical decision making/goals of care:  - Discussed care with patient as detailed above in HPI.  Discussed possible pathways for medical care moving forward.  Patient wants to continue appropriate medical management at this time with hope he can "get better".  Patient willing to participate with PT/OT to improve his strength as able.  Discussed that should patient ever feel that interventions are no longer providing him benefit, can always transition to comfort focused care and quality time at home supported with hospice.  Patient acknowledges this and voiced hearing that this would be an option should he decide that is the route he would like to pursue.  Noted palliative medicine team will follow along peripherally at this time.  Please reach out if acute PMT needs arise.  -  Code Status: Limited: Do not attempt resuscitation (DNR)  -DNR-LIMITED -Do Not Intubate/DNI   # Psychosocial Support:  -Wife, son  # Discharge Planning: To Be Determined  Discussed with: Hospitalist, RN, patient  Thank you for allowing the palliative care team to participate in the care Bari Mantis.  Alvester Morin, DO Palliative Care Provider PMT # [phone]  If patient remains symptomatic despite maximum doses, please call PMT at [phone] between 0700 and 1900. Outside of these hours, please call attending, as PMT does not have night coverage.  Personally spent 35 minutes in patient care including extensive chart review (labs, imaging, progress/consult notes, vital signs), medically appropraite exam, discussed with treatment team, education to patient, family, and staff, documenting clinical information, medication review and management, coordination of care, and available advanced directive documents.

## 2023-10-29 NOTE — Progress Notes (Signed)
 Progress Note    Garrett Ferguson   ZOX:[id number]  DOB: [birth year]  DOA: 10/18/2023     11 PCP: [facility] [place] [facility]  Initial CC: fall at home   Hospital Course: Garrett Ferguson is a 78 year old male with history of bladder cancer,HTN,restless leg syndrome, chronic lymphedema of B/L lower extremity, peripheral artery disease, GERD who was brought initially to [facility] after he fell at home.  EMS found him to be seizing.  He was confused and agitated afterwards, hypothermic, hypotensive then became unresponsive.   On presentation, UA was suspicious for UTI.  Sepsis protocol started.  Transferred to [facility] long ICU.  Code called for respiratory arrest that started on vasopressors, intubated.  No loss of pulse or CPR done.  Palliative care consulted for goals of care discussion.  Currently DNR.  Patient transferred to [facility] service on 3/14.  Clinically improving.  PT/OT recommending SNF on discharge.   Important events: 3/6 tx from [facility] ER to [facility], urosepsis, AMS, intubated on arrival  3/7 difficulty placing foley, ended up having trauma and urology was consulted 3/9 Off of pressors. Documented SBT x 9 hours. made DNR w palliative care.  3/10 Fib RVR w SBT; back to full support, NSR w amio gtt. Abx to doxy rocephin  3/11 fever, ongoing pressors. Weaned for a few hours in the late afternoon but was pretty tired.  3/12 one-way extubation, DNR/ DNI Weaned to room air   A&P:  Acute hypoxic respiratory failure -resolved - Suspected to be from community-acquired pneumonia, septic shock.  Initially intubated, now extubated and on nasal cannula.   Goal is to wean oxygen.  Continue pulmonary hygiene, flutter valve, incentive spirometer, bronchodilators as needed. As per goals of care discussion, okay for BiPAP if needed but transition to comfort care if continues to decline. - remains on RA   Septic shock - resolved  Thought to be secondary to pneumonia, possible left lower extremity  cellulitis.Completed 7 days course of doxycycline and ceftriaxone on 3/12.  Cultures have remained negative so far.   Dysphagia - Speech therapy following.  Started on regular diet    A-fib with RVR - resolved  - Went into rapid A-fib on 3/10 but spontaneously converted to normal sinus rhythm with amiodarone.  - This was most likely associated with acute respiratory distress.   - Currently on aspirin, statin.  Anticoagulation not felt to be indicated as afib precipitated in setting of acute illness/shock   Hematuria - Likely from traumatic catheterization.  Foley placed by urology on 3/8.  Recommendation is to continue for at least 7 days before restarting voiding trial.   - Voiding trial no sooner than 3/16; let's keep another couple days as he's just now starting to recover steadily; also still has notable hematuria draining from around catheter and will need to wait until this resolves   Normocytic anemia - Currently stable.  Continue to monitor   History of chronic lymphedema - Continue supportive care, wound care was consulted   Frequent falls/physical deconditioning - Mostly wheelchair dependent at home since March 15, 2023.  PT/OT consulted.  SNF recommended   Hyperglycemia - Currently on sliding scale.  A1c of 5.8.  Blood sugar stable now   History of restless leg syndrome/chronic pain syndrome/anxiety/depression - On as needed oxycodone, hydroxyzine   Goals of care - Palliative care following.  DNR/DNI - Goal is to continue current care but family opting for comfort care if further declines.  Patient is stable at this  time but overall low functioning   Interval History:  No events overnight.  Still has some blood draining from around catheter at tip of penis per nursing.  Otherwise remains on room air and continues to be stable.  Tentative plan for rehab.  Old records reviewed in assessment of this patient   DVT prophylaxis:  heparin injection 5,000 Units Start:  10/18/23 2200 SCDs Start: 10/18/23 2032 TED hose Start: 10/18/23 1442   Code Status:   Code Status: Limited: Do not attempt resuscitation (DNR) -DNR-LIMITED -Do Not Intubate/DNI   Mobility Assessment (Last 72 Hours)     Mobility Assessment     Row Name 10/29/23 0800 10/28/23 1919 10/28/23 0800 10/27/23 2000 10/26/23 2000   What is the highest level of mobility based on the progressive mobility assessment? Level 1 (Bedfast) - Unable to balance while sitting on edge of bed Level 1 (Bedfast) - Unable to balance while sitting on edge of bed Level 1 (Bedfast) - Unable to balance while sitting on edge of bed Level 1 (Bedfast) - Unable to balance while sitting on edge of bed Level 1 (Bedfast) - Unable to balance while sitting on edge of bed   Is the above level different from baseline mobility prior to current illness? Yes - Recommend PT order Yes - Recommend PT order Yes - Recommend PT order Yes - Recommend PT order Yes - Recommend PT order            Barriers to discharge: none Disposition Plan:  SNF Status is: Inpt  Objective: Blood pressure (!) 118/48, pulse 66, temperature 97.9 F (36.6 C), temperature source Oral, resp. rate (!) 27, height 5\' 11"  (1.803 m), weight 97.8 kg, SpO2 100%.  Examination:  Physical Exam Constitutional:      General: He is not in acute distress.    Appearance: Normal appearance.  HENT:     Head: Normocephalic and atraumatic.     Mouth/Throat:     Mouth: Mucous membranes are moist.  Eyes:     Extraocular Movements: Extraocular movements intact.  Cardiovascular:     Rate and Rhythm: Normal rate and regular rhythm.  Pulmonary:     Effort: Pulmonary effort is normal. No respiratory distress.     Breath sounds: Normal breath sounds. No wheezing.  Abdominal:     General: Bowel sounds are normal. There is no distension.     Palpations: Abdomen is soft.     Tenderness: There is no abdominal tenderness.  Musculoskeletal:        General: Swelling  (chronic LE lymphedema; B/L UE edema noted) present.     Cervical back: Normal range of motion and neck supple.  Skin:    General: Skin is warm and dry.  Neurological:     General: No focal deficit present.     Mental Status: He is alert.  Psychiatric:        Mood and Affect: Mood normal.        Behavior: Behavior normal.      Consultants:  Palliative care  Procedures:    Data Reviewed: No results found for this or any previous visit (from the past 24 hours).   I have reviewed pertinent nursing notes, vitals, labs, and images as necessary. I have ordered labwork to follow up on as indicated.  I have reviewed the last notes from staff over past 24 hours. I have discussed patient's care plan and test results with nursing staff, CM/SW, and other staff as appropriate.  Time spent:  Greater than 50% of the 55 minute visit was spent in counseling/coordination of care for the patient as laid out in the A&P.   LOS: 11 days   Lewie Chamber, MD [facility] 10/29/2023, 2:25 PM

## 2023-10-30 ENCOUNTER — Inpatient Hospital Stay (HOSPITAL_COMMUNITY)

## 2023-10-30 DIAGNOSIS — R5381 Other malaise: Secondary | ICD-10-CM | POA: Diagnosis not present

## 2023-10-30 DIAGNOSIS — R6521 Severe sepsis with septic shock: Secondary | ICD-10-CM | POA: Diagnosis not present

## 2023-10-30 DIAGNOSIS — M7989 Other specified soft tissue disorders: Secondary | ICD-10-CM

## 2023-10-30 DIAGNOSIS — A419 Sepsis, unspecified organism: Secondary | ICD-10-CM | POA: Diagnosis not present

## 2023-10-30 MED ORDER — MORPHINE SULFATE (PF) 2 MG/ML IV SOLN
2.0000 mg | INTRAVENOUS | Status: DC | PRN
  Administered 2023-10-30 – 2023-11-03 (×11): 2 mg via INTRAVENOUS
  Filled 2023-10-30 (×12): qty 1

## 2023-10-30 MED ORDER — LABETALOL HCL 5 MG/ML IV SOLN
10.0000 mg | INTRAVENOUS | Status: DC | PRN
  Administered 2023-10-30 – 2023-10-31 (×3): 10 mg via INTRAVENOUS
  Filled 2023-10-30 (×3): qty 4

## 2023-10-30 MED ORDER — MORPHINE SULFATE (PF) 2 MG/ML IV SOLN: 2.0000 mg | INTRAVENOUS | Status: DC | PRN

## 2023-10-30 MED ORDER — HYDRALAZINE HCL 25 MG PO TABS
25.0000 mg | ORAL_TABLET | ORAL | Status: DC | PRN
  Administered 2023-10-30 – 2023-11-01 (×3): 25 mg via ORAL
  Filled 2023-10-30 (×4): qty 1

## 2023-10-30 MED ORDER — OXYCODONE HCL 5 MG PO TABS
5.0000 mg | ORAL_TABLET | ORAL | Status: DC | PRN
  Administered 2023-10-30 – 2023-11-01 (×3): 5 mg via ORAL
  Filled 2023-10-30 (×3): qty 1

## 2023-10-30 NOTE — TOC Progression Note (Addendum)
 Transition of Care [facility]) - Progression Note    Patient Details  Name: Garrett Ferguson MRN: [id number] Date of Birth: 05-24-1946  Transition of Care [facility]) CM/SW Contact  Beckie Busing, RN Phone [phone]  10/30/2023, 12:10 PM  Clinical Narrative:    Cm at bedside to discuss SNF referral patient is unable to give input but gives CM permission to call wife. CM called wife Kathie Rhodes with no answer. CM to initiated FL2 while waiting on permission to initiate SNF search.   1600 CM at bedside . Wife is not present. Patient is alert and oriented times four and gives CM permission to initiate bed search. Patient states that his wife should be up later today. Medicare .gov list given to patient for choice. FL2 completed and faxed out for possible offers.      Barriers to Discharge: Continued Medical Work up  Expected Discharge Plan and Services In-house Referral: NA Discharge Planning Services: NA   Living arrangements for the past 2 months: Single Family Home                   DME Agency: NA       HH Arranged: NA           Social Determinants of Health (SDOH) Interventions SDOH Screenings   Food Insecurity: Patient Unable To Answer (10/21/2023)  Housing: Patient Unable To Answer (10/21/2023)  Transportation Needs: Patient Unable To Answer (10/21/2023)  Utilities: Patient Unable To Answer (10/21/2023)  Financial Resource Strain: Medium Risk (04/06/2021)   Received from [facility], [facility]  Physical Activity: Insufficiently Active (02/23/2021)   Received from [facility], [facility]  Social Connections: Patient Unable To Answer (10/21/2023)  Stress: No Stress Concern Present (02/23/2021)   Received from [facility], [facility]  Tobacco Use: Low Risk  (10/18/2023)  Health Literacy: Low Risk  (02/23/2021)   Received from [facility], [facility]    Readmission Risk Interventions    10/22/2023    1:09 PM  Readmission Risk Prevention Plan   Transportation Screening Complete  PCP or Specialist Appt within 5-7 Days Complete  Home Care Screening Complete  Medication Review (RN CM) Complete

## 2023-10-30 NOTE — Evaluation (Signed)
 Occupational Therapy Evaluation Patient Details Name: Garrett Ferguson MRN: [id number] DOB: [birth year] Today's Date: 10/30/2023   History of Present Illness   patient is a 78 year old male who brought to ED via  EMS on 10/18/23 with  falls, seizure, hypotension, hypothermia, sepsis, respiratory arrest requiring uintubation. Extubated 10/24/23. WUJ:WJXBJYN cancer, HTN, RLS, Chronic Lymphedema LE, PAD, CKD, GERD, L hand RSD, L TKA x 3, TURP     Clinical Impressions Patient was able to engage in some trunk control and neck ROM sitting in chair position in bed. Patient with increased pain and +3 needed for rolling in bed with patient stiff with turning. Patient follows commands and is pleasant during session. Patient will benefit from continued inpatient follow up therapy, <3 hours/day. Patient's discharge plan remains appropriate at this time. OT will continue to follow acutely.       If plan is discharge home, recommend the following:   Two people to help with walking and/or transfers;Two people to help with bathing/dressing/bathroom;Assistance with cooking/housework;Assistance with feeding;Help with stairs or ramp for entrance;Assist for transportation      Equipment Recommendations   Wheelchair (measurements OT);Wheelchair cushion (measurements OT);Hospital bed;Hoyer lift      Precautions/Restrictions   Precautions Precautions: Fall Precaution/Restrictions Comments: severe L knee pain, multiple TKA revisions, lymphadema Restrictions Weight Bearing Restrictions Per Provider Order: No     Mobility Bed Mobility Overal bed mobility: Needs Assistance Bed Mobility: Rolling Rolling: Total assist, +2 for physical assistance         General bed mobility comments: placed in bed chair position, patient worked on leaning forward using rails, Able to sit forward  x ~ 5 ", able to  rub patient's back. Patient worked on Neck ROM, UE active ROM, right knee ext, trunk balance             ADL either performed or assessed with clinical judgement   ADL Overall ADL's : Needs assistance/impaired                             Toileting- Clothing Manipulation and Hygiene: Bed level;Total assistance Toileting - Clothing Manipulation Details (indicate cue type and reason): patient stiff with movement and resistive to rolling at times. patient needing +3 for movement at bed level to roll.       General ADL Comments: patient engaged in reaching forwards sitting in chair position in bed. patient was able to stretch RUE after increased time to bed rail and adjust to pull trunk off the bed. patient was also educated on neck stretching and shoulder movement to maintain ROM. patient verbalized and demonstrated understanding.      Pertinent Vitals/Pain Pain Assessment Pain Assessment: Faces Faces Pain Scale: Hurts worst Pain Location: L side Pain Descriptors / Indicators: Aching, Tightness, Constant, Grimacing, Crying, Moaning Pain Intervention(s): Limited activity within patient's tolerance, Monitored during session     Extremity/Trunk Assessment             Communication Communication Communication: No apparent difficulties Factors Affecting Communication: Hearing impaired   Cognition Arousal: Alert Behavior During Therapy: WFL for tasks assessed/performed             Following commands: Intact                         OT Problem List: Decreased strength;Decreased range of motion;Impaired balance (sitting and/or standing);Decreased coordination;Decreased safety awareness;Obesity;Impaired UE functional use;Pain   OT Treatment/Interventions:  Self-care/ADL training;DME and/or AE instruction;Balance training;Patient/family education;Therapeutic exercise      OT Goals(Current goals can be found in the care plan section)   Acute Rehab OT Goals Patient Stated Goal: to be able to transfer   OT Frequency:  Min 2X/week    Co-evaluation    Reason for Co-Treatment: Complexity of the patient's impairments (multi-system involvement);For patient/therapist safety PT goals addressed during session: Mobility/safety with mobility OT goals addressed during session: ADL's and self-care      AM-PAC OT "6 Clicks" Daily Activity     Outcome Measure Help from another person eating meals?: A Little Help from another person taking care of personal grooming?: Total Help from another person toileting, which includes using toliet, bedpan, or urinal?: Total Help from another person bathing (including washing, rinsing, drying)?: Total Help from another person to put on and taking off regular upper body clothing?: Total Help from another person to put on and taking off regular lower body clothing?: Total 6 Click Score: 8   End of Session Nurse Communication: Mobility status  Activity Tolerance: Patient tolerated treatment well Patient left: in bed;with call bell/phone within reach;with bed alarm set  OT Visit Diagnosis: Other abnormalities of gait and mobility (R26.89);Muscle weakness (generalized) (M62.81);Pain                Time: 1225-1258 OT Time Calculation (min): 33 min Charges:  OT General Charges $OT Visit: 1 Visit OT Treatments $Therapeutic Activity: 8-22 mins  Rosalio Loud, MS Acute Rehabilitation Department Office# [phone]   Selinda Flavin 10/30/2023, 3:40 PM

## 2023-10-30 NOTE — [place] FL2 (Signed)
 [place] MEDICAID FL2 LEVEL OF CARE FORM     IDENTIFICATION  Patient Name: Garrett Ferguson Birthdate: [birth year] Sex: male Admission Date (Current Location): 10/18/2023  [facility] and [place] Number:  Geophysical data processor and Address:  [facility],  [address] [place], [place] [address]      Provider Number: [id number]  Attending Physician Name and Address:  Lewie Chamber, MD  Relative Name and Phone Number:  Keyandre Pileggi [phone]    Current Level of Care: SNF Recommended Level of Care: Skilled Nursing Facility Prior Approval Number:    Date Approved/Denied:   PASRR Number: [id number]  Discharge Plan: SNF    Current Diagnoses: Patient Active Problem List   Diagnosis Date Noted   Physical deconditioning 10/27/2023   Palliative care encounter 10/23/2023   Goals of care, counseling/discussion 10/23/2023   Need for emotional support 10/23/2023   Counseling and coordination of care 10/23/2023   Hypotensive episode 10/18/2023   History of bladder cancer 10/18/2023   Lymphedema 10/18/2023   Hypoalbuminemia 10/18/2023   Severe sepsis (HCC) 03/22/2023   Acute encephalopathy 03/21/2023   Hypertension    Dysrhythmia    Restless legs syndrome (RLS) 08/08/2013   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 back wound noted to left posterior tibia daily dressings)                       Personal Care Assistance Level of Assistance  Bathing, Feeding, Dressing, Total care Bathing Assistance: Maximum  assistance Feeding assistance: Limited assistance Dressing Assistance: Maximum assistance Total Care Assistance: Maximum assistance   Functional Limitations Info  Sight, Hearing, Speech Sight Info: Adequate Hearing Info: Adequate Speech Info: Adequate    SPECIAL CARE FACTORS FREQUENCY  PT (By licensed PT), OT (By licensed OT)     PT Frequency: 5x/wk OT Frequency: 5x/wk            Contractures Contractures Info: Not present    Additional Factors Info  Code Status, Allergies, Psychotropic, Insulin Sliding Scale Code Status Info: Full Allergies Info: Sulfamethoxazole-trimethoprim, Diclofenac Sodium, Latex, Levofloxacin, Penicillins Psychotropic Info: see discharge summary Insulin Sliding Scale Info: see discharge summary       Current Medications (10/30/2023):  This is the current hospital active medication list Current Facility-Administered Medications  Medication Dose Route Frequency Provider Last Rate Last Admin   acetaminophen (TYLENOL) suppository 650 mg  650 mg Rectal Q6H PRN Selmer Dominion B, NP       acetaminophen (TYLENOL) tablet 650 mg  650 mg Oral Q6H PRN Burnadette Pop, MD   650 mg at 10/29/23 1609   aspirin chewable tablet 81 mg  81 mg Oral Daily Burnadette Pop, MD   81 mg at 10/30/23 0926   atorvastatin (LIPITOR) tablet 10 mg  10 mg Oral Daily Burnadette Pop, MD   10 mg at 10/30/23 0926   bisacodyl (DULCOLAX) EC tablet 10 mg  10 mg Oral Daily Lanier Clam, NP   10 mg at 10/29/23 5643   Chlorhexidine Gluconate Cloth 2 % PADS 6 each  6 each Topical Daily Olalere, Adewale A, MD   6 each at 10/29/23 2102   docusate sodium (COLACE) capsule 100 mg  100 mg Oral BID Burnadette Pop, MD   100 mg at 10/29/23 0903   feeding supplement (ENSURE ENLIVE / ENSURE PLUS) liquid 237 mL  237 mL Oral BID BM Adhikari, Amrit, MD   237 mL at 10/30/23 1422   gabapentin (NEURONTIN) capsule 100 mg  100 mg Oral BID Lewie Chamber, MD   100 mg at 10/30/23 0926   heparin injection  5,000 Units  5,000 Units Subcutaneous Q8H Rozann Lesches, MD   5,000 Units at 10/30/23 1425   hydrALAZINE (APRESOLINE) tablet 25 mg  25 mg Oral Q4H PRN Lewie Chamber, MD   25 mg at 10/30/23 1039   hydrOXYzine (ATARAX) 10 MG/5ML syrup 10 mg  10 mg Oral TID PRN Selmer Dominion B, NP   10 mg at 10/29/23 2100   ipratropium (ATROVENT) nebulizer solution 0.5 mg  0.5 mg Nebulization Q6H PRN Shahmehdi, Seyed A, MD       labetalol (NORMODYNE) injection 10 mg  10 mg Intravenous Q4H PRN Lewie Chamber, MD       levalbuterol Pauline Aus) nebulizer solution 0.63 mg  0.63 mg Nebulization Q6H PRN Shahmehdi, Seyed A, MD       lidocaine (XYLOCAINE) 2 % jelly 1 Application  1 Application Urethral BID PRN Burnadette Pop, MD   1 Application at 10/26/23 1436   lip balm (CARMEX) ointment 1 Application  1 Application Topical PRN Burnadette Pop, MD       morphine (PF) 2 MG/ML injection 2 mg  2 mg Intravenous Q4H PRN Lewie Chamber, MD   2 mg at 10/30/23 1549   multivitamin with minerals tablet 1 tablet  1 tablet Oral Daily Burnadette Pop, MD   1 tablet at 10/30/23 1610   naphazoline-glycerin (CLEAR EYES REDNESS) ophth solution 2 drop  2 drop Left Eye QID Lewie Chamber, MD   2 drop at 10/30/23 1422   Oral care mouth rinse  15 mL Mouth Rinse PRN Burnadette Pop, MD       oxyCODONE (Oxy IR/ROXICODONE) immediate release tablet 5 mg  5 mg Oral Q8H Adhikari, Amrit, MD   5 mg at 10/30/23 1421   polyethylene glycol (MIRALAX / GLYCOLAX) packet 17 g  17 g Oral Daily Burnadette Pop, MD   17 g at 10/27/23 0946   pramipexole (MIRAPEX) tablet 0.125 mg  0.125 mg Oral TID Lewie Chamber, MD   0.125 mg at 10/30/23 1421   prednisoLONE acetate (PRED FORTE) 1 % ophthalmic suspension 1 drop  1 drop Right Eye TID Lewie Chamber, MD   1 drop at 10/30/23 9604     Discharge Medications: Please see discharge summary for a list of discharge medications.  Relevant Imaging Results:  Relevant Lab Results:   Additional Information SS#  [id number]  Beckie Busing, RN

## 2023-10-30 NOTE — Progress Notes (Signed)
  Daily Progress Note   Patient Name: Garrett Ferguson       Date: 10/30/2023 DOB: Mar 27, 1946  Age: 78 y.o. MRN#: [id number] Attending Physician: Lewie Chamber, MD Primary Care Physician: [facility] [place] [facility] Admit Date: 10/18/2023 Length of Stay: 12 days  Reason for Consultation/Follow-up: Establishing goals of care  Subjective:   CC: Patient resting in bed, undergoing PT attempt and patient care also being done.    Subjective:  Reviewed EMR prior to seeing patient.   Appears in no distress Chart reviewed, TOC trying to get in touch with wife regarding SNF PT note reviewed.    Objective:   Vital Signs:  BP (!) 180/76   Pulse 63   Temp 98.2 F (36.8 C)   Resp (!) 23   Ht 5\' 11"  (1.803 m)   Wt 97.8 kg   SpO2 100%   BMI 30.07 kg/m   Physical Exam: General: Awake, laying in bed, chronically ill-appearing  (hard of hearing as well) Cardiovascular: RRR, edema in the lower extremities bilaterally Respiratory: no increased work of breathing noted Skin: Chronic venous stasis changes in lower extremities bilaterally  Imaging: I personally reviewed recent imaging.   Assessment & Plan:   Assessment: Patient is an 78 year old male with a past medical history of bladder cancer, hypertension, RLS, chronic pain, chronic lymphedema in lower extremities bilaterally, PAD, CKD, and GERD who was transferred from [facility] on 10/18/2023 after a fall at home when was noted had seizure.  Since admission, patient has received management for acute metabolic encephalopathy, acute hypoxic respiratory failure requiring intubation with mechanical ventilation support secondary to pneumonia, and acute onset A-fib with RVR.  Palliative medicine team consulted to assist with complex medical decision making.  Recommendations/Plan: # Complex medical decision making/goals of care:  - PT note reviewed, TOC note reviewed, likely for SNF.   -  Code Status: Limited: Do not attempt resuscitation (DNR)  -DNR-LIMITED -Do Not Intubate/DNI   # Psychosocial Support:  -Wife, son  # Discharge Planning: To Be Determined  Discussed with: IDT  Thank you for allowing the palliative care team to participate in the care Bari Mantis.  Low MDM Rosalin Hawking MD.  Palliative Care Provider PMT # [phone]  If patient remains symptomatic despite maximum doses, please call PMT at [phone] between 0700 and 1900. Outside of these hours, please call attending, as PMT does not have night coverage.

## 2023-10-30 NOTE — Progress Notes (Signed)
 Progress Note    Garrett Ferguson   WGN:[id number]  DOB: 02-19-1946  DOA: 10/18/2023     12 PCP: [facility]  Initial CC: fall at home   Hospital Course: Garrett Ferguson is a 78 year old male with history of bladder cancer,HTN,restless leg syndrome, chronic lymphedema of B/L lower extremity, peripheral artery disease, GERD who was brought initially to [facility] after he fell at home.  EMS found him to be seizing.  He was confused and agitated afterwards, hypothermic, hypotensive then became unresponsive.   On presentation, UA was suspicious for UTI.  Sepsis protocol started.  Transferred to [facility] long ICU.  Code called for respiratory arrest that started on vasopressors, intubated.  No loss of pulse or CPR done.  Palliative care consulted for goals of care discussion.  Currently DNR.  Patient transferred to [facility] service on 3/14.  Clinically improving.  PT/OT recommending SNF on discharge.   Important events: 3/6 tx from [facility] ER to [facility], urosepsis, AMS, intubated on arrival  3/7 difficulty placing foley, ended up having trauma and urology was consulted 3/9 Off of pressors. Documented SBT x 9 hours. made DNR w palliative care.  3/10 Fib RVR w SBT; back to full support, NSR w amio gtt. Abx to doxy rocephin  3/11 fever, ongoing pressors. Weaned for a few hours in the late afternoon but was pretty tired.  3/12 one-way extubation, DNR/ DNI Weaned to room air   A&P:  Hematuria - Likely from traumatic catheterization.  Foley placed by urology on 3/8.  Recommendation is to continue for at least 7 days before restarting voiding trial.   - Voiding trial no sooner than 3/16; let's keep another couple days as he's just now starting to recover steadily; also still has notable hematuria draining from around catheter and will need to wait until this resolves  Left forearm probable SVT - appears to have probably SVT in left forearm but given size and prominence will obtain u/s to check for  DVT as well  Acute hypoxic respiratory failure -resolved - Suspected to be from community-acquired pneumonia, septic shock.  Initially intubated, now extubated and on nasal cannula.   Goal is to wean oxygen.  Continue pulmonary hygiene, flutter valve, incentive spirometer, bronchodilators as needed. As per goals of care discussion, okay for BiPAP if needed but transition to comfort care if continues to decline. - remains on RA   Septic shock - resolved  Thought to be secondary to pneumonia, possible left lower extremity cellulitis.Completed 7 days course of doxycycline and ceftriaxone on 3/12.  Cultures have remained negative so far.   Dysphagia - Speech therapy following.  Started on regular diet    A-fib with RVR - resolved  - Went into rapid A-fib on 3/10 but spontaneously converted to normal sinus rhythm with amiodarone.  - This was most likely associated with acute respiratory distress.   - Currently on aspirin, statin.  Anticoagulation not felt to be indicated as afib precipitated in setting of acute illness/shock   Normocytic anemia - Currently stable.  Continue to monitor   History of chronic lymphedema - Continue supportive care, wound care was consulted   Frequent falls/physical deconditioning - Mostly wheelchair dependent at home since March 15, 2023.  PT/OT consulted.  SNF recommended   Hyperglycemia - Currently on sliding scale.  A1c of 5.8.  Blood sugar stable now   History of restless leg syndrome/chronic pain syndrome/anxiety/depression - On as needed oxycodone, hydroxyzine   Goals of care -  Palliative care following.  DNR/DNI - Goal is to continue current care but family opting for comfort care if further declines.  Patient is stable at this time but overall low functioning   Interval History:  No events overnight.  Continues to improve but still very deconditioned.  Plan is for going to rehab.  Palliative care has been following as well. Anticipate possible  readiness for discharge and another few days but still need to remove Foley catheter and do voiding trial.  Old records reviewed in assessment of this patient   DVT prophylaxis:  heparin injection 5,000 Units Start: 10/18/23 2200 SCDs Start: 10/18/23 2032 TED hose Start: 10/18/23 1442   Code Status:   Code Status: Limited: Do not attempt resuscitation (DNR) -DNR-LIMITED -Do Not Intubate/DNI   Mobility Assessment (Last 72 Hours)     Mobility Assessment     Row Name 10/29/23 1627 10/29/23 0800 10/28/23 1919 10/28/23 0800 10/27/23 2000   What is the highest level of mobility based on the progressive mobility assessment? Level 1 (Bedfast) - Unable to balance while sitting on edge of bed Level 1 (Bedfast) - Unable to balance while sitting on edge of bed Level 1 (Bedfast) - Unable to balance while sitting on edge of bed Level 1 (Bedfast) - Unable to balance while sitting on edge of bed Level 1 (Bedfast) - Unable to balance while sitting on edge of bed   Is the above level different from baseline mobility prior to current illness? -- Yes - Recommend PT order Yes - Recommend PT order Yes - Recommend PT order Yes - Recommend PT order            Barriers to discharge: none Disposition Plan:  SNF Status is: Inpt  Objective: Blood pressure (!) 180/76, pulse 63, temperature 98.2 F (36.8 C), resp. rate (!) 23, height 5\' 11"  (1.803 m), weight 97.8 kg, SpO2 100%.  Examination:  Physical Exam Constitutional:      General: He is not in acute distress.    Appearance: Normal appearance.  HENT:     Head: Normocephalic and atraumatic.     Mouth/Throat:     Mouth: Mucous membranes are moist.  Eyes:     Extraocular Movements: Extraocular movements intact.  Cardiovascular:     Rate and Rhythm: Normal rate and regular rhythm.  Pulmonary:     Effort: Pulmonary effort is normal. No respiratory distress.     Breath sounds: Normal breath sounds. No wheezing.  Abdominal:     General: Bowel  sounds are normal. There is no distension.     Palpations: Abdomen is soft.     Tenderness: There is no abdominal tenderness.  Musculoskeletal:        General: Swelling (chronic LE lymphedema; B/L UE edema noted) present.     Cervical back: Normal range of motion and neck supple.     Comments: Left forearm noted with palpable erythematous lesion marked with skin marker and slowly reducing  Skin:    General: Skin is warm and dry.  Neurological:     General: No focal deficit present.     Mental Status: He is alert.  Psychiatric:        Mood and Affect: Mood normal.        Behavior: Behavior normal.      Consultants:  Palliative care  Procedures:    Data Reviewed: No results found for this or any previous visit (from the past 24 hours).   I have reviewed pertinent nursing  notes, vitals, labs, and images as necessary. I have ordered labwork to follow up on as indicated.  I have reviewed the last notes from staff over past 24 hours. I have discussed patient's care plan and test results with nursing staff, CM/SW, and other staff as appropriate.  Time spent: Greater than 50% of the 55 minute visit was spent in counseling/coordination of care for the patient as laid out in the A&P.   LOS: 12 days   Lewie Chamber, MD [facility] 10/30/2023, 1:31 PM

## 2023-10-30 NOTE — Progress Notes (Signed)
 Physical Therapy Treatment Patient Details Name: Garrett Ferguson MRN: [id number] DOB: September 04, 1945 Today's Date: 10/30/2023   History of Present Illness 78 yo male brought to ED via  EMS on 10/18/23 with  falls, seizure, hypotension, hypothermia, sepsis, respiratory arrest requiring uintubation. Extubated 10/24/23. WUJ:[id number] cancer, HTN, RLS, Chronic Lymphedema LE, PAD, CKD, GERD, L hand RSD, L TKA x 3, TURP    PT Comments  The patient reports severe Left leg pain with rolling for hygiene. Placed patient ibn bed chair position to work on  trunk balance and neck exercises and UE's and RLE. Patient able to pull forward on rails multiple times. Patient reports PTYA was transfers only to [facility] with family assisting due to LLE , lack full knee ext.  Continue PT for mobility as tolerated.   If plan is discharge home, recommend the following: Two people to help with walking and/or transfers;Two people to help with bathing/dressing/bathroom;Help with stairs or ramp for entrance;Assistance with cooking/housework   Can travel by private vehicle     No  Equipment Recommendations  None recommended by PT    Recommendations for Other Services       Precautions / Restrictions Precautions Precautions: Fall Precaution/Restrictions Comments: severe L knee pain, multiple TKA revisions, lymphadema Restrictions Weight Bearing Restrictions Per Provider Order: No     Mobility  Bed Mobility   Bed Mobility: Rolling Rolling: Total assist, +2 for physical assistance         General bed mobility comments: placed in bed chair position, patient worked on leaning forward using rails, Able to sit forward  x ~ 5 ", able to  rub patient's back. Patient worked on Neck ROM, UE active ROM, right knee ext, trunk balance    Transfers                   General transfer comment: unable, requires lift    Ambulation/Gait                   Stairs             Wheelchair Mobility     Tilt  Bed    Modified Rankin (Stroke Patients Only)       Balance     Sitting balance-Leahy Scale: Poor Sitting balance - Comments: in bed chair position, able to pull forward and  support self with rails                                    Communication Communication Communication: No apparent difficulties Factors Affecting Communication: Hearing impaired  Cognition Arousal: Alert Behavior During Therapy: WFL for tasks assessed/performed   PT - Cognitive impairments: Memory                       PT - Cognition Comments: Patient appears  in much pain, anxious, difficulty concentrating Following commands: Intact      Cueing Cueing Techniques: Verbal cues, Gestural cues, Tactile cues  Exercises      General Comments        Pertinent Vitals/Pain Pain Assessment Faces Pain Scale: Hurts worst Pain Descriptors / Indicators: Aching, Tightness, Constant, Grimacing, Crying, Moaning Pain Intervention(s): Monitored during session, Limited activity within patient's tolerance, Premedicated before session, Repositioned    Home Living  Prior Function            PT Goals (current goals can now be found in the care plan section) Progress towards PT goals: Progressing toward goals    Frequency    Min 2X/week      PT Plan      Co-evaluation PT/OT/SLP Co-Evaluation/Treatment: Yes Reason for Co-Treatment: Complexity of the patient's impairments (multi-system involvement);For patient/therapist safety PT goals addressed during session: Mobility/safety with mobility OT goals addressed during session: ADL's and self-care      AM-PAC PT "6 Clicks" Mobility   Outcome Measure  Help needed turning from your back to your side while in a flat bed without using bedrails?: Total Help needed moving from lying on your back to sitting on the side of a flat bed without using bedrails?: Total Help needed moving to and from a bed  to a chair (including a wheelchair)?: Total Help needed standing up from a chair using your arms (e.g., wheelchair or bedside chair)?: Total Help needed to walk in hospital room?: Total Help needed climbing 3-5 steps with a railing? : Total 6 Click Score: 6    End of Session   Activity Tolerance: Patient tolerated treatment well Patient left: in bed;with call bell/phone within reach Nurse Communication: Mobility status;Need for lift equipment PT Visit Diagnosis: Unsteadiness on feet (R26.81);Muscle weakness (generalized) (M62.81);Pain Pain - Right/Left: Left Pain - part of body: Knee     Time: 1222-1258 PT Time Calculation (min) (ACUTE ONLY): 36 min  Charges:    $Therapeutic Activity: 8-22 mins PT General Charges $$ ACUTE PT VISIT: 1 Visit                     Garrett Ferguson PT Acute Rehabilitation Services Office [phone] Weekend pager-[phone]    Rada Hay 10/30/2023, 2:16 PM

## 2023-10-30 NOTE — Progress Notes (Signed)
 Left upper extremity venous duplex has been completed. Preliminary results can be found in CV Proc through chart review.   10/30/23 1:59 PM Olen Cordial RVT

## 2023-10-30 NOTE — Plan of Care (Signed)
  Problem: Fluid Volume: Goal: Hemodynamic stability will improve Outcome: Progressing   Problem: Clinical Measurements: Goal: Diagnostic test results will improve Outcome: Progressing   Problem: Coping: Goal: Level of anxiety will decrease Outcome: Progressing   Problem: Role Relationship: Goal: Method of communication will improve Outcome: Progressing

## 2023-10-31 DIAGNOSIS — R6521 Severe sepsis with septic shock: Secondary | ICD-10-CM | POA: Diagnosis not present

## 2023-10-31 DIAGNOSIS — A419 Sepsis, unspecified organism: Secondary | ICD-10-CM | POA: Diagnosis not present

## 2023-10-31 LAB — BRAIN NATRIURETIC PEPTIDE: B Natriuretic Peptide: 348.9 pg/mL — ABNORMAL HIGH (ref 0.0–100.0)

## 2023-10-31 MED ORDER — LABETALOL HCL 5 MG/ML IV SOLN
10.0000 mg | INTRAVENOUS | Status: DC | PRN
  Administered 2023-11-01: 10 mg via INTRAVENOUS
  Filled 2023-10-31: qty 4

## 2023-10-31 MED ORDER — AMLODIPINE BESYLATE 10 MG PO TABS
5.0000 mg | ORAL_TABLET | Freq: Every day | ORAL | Status: DC
Start: 2023-10-31 — End: 2023-11-03
  Administered 2023-10-31 – 2023-11-03 (×4): 5 mg via ORAL
  Filled 2023-10-31 (×4): qty 1

## 2023-10-31 MED ORDER — LOSARTAN POTASSIUM 25 MG PO TABS
25.0000 mg | ORAL_TABLET | Freq: Every day | ORAL | Status: DC
  Administered 2023-10-31 – 2023-11-03 (×4): 25 mg via ORAL
  Filled 2023-10-31 (×4): qty 1

## 2023-10-31 NOTE — Progress Notes (Signed)
  Daily Progress Note   Patient Name: Garrett Ferguson       Date: 10/31/2023 DOB: Mar 17, 1946  Age: 78 y.o. MRN#: [id number] Attending Physician: Garrett Ferguson., * Primary Care Physician: [facility] Admit Date: 10/18/2023 Length of Stay: 13 days  Reason for Consultation/Follow-up: Establishing goals of care  Subjective:   CC: Patient resting in bed, undergoing PT attempt and patient care also being done.    Subjective:  Reviewed EMR prior to seeing patient.   Appears in no distress Chart reviewed, TOC trying to get in touch with wife regarding SNF PT note reviewed.  Patient states that he is participating with PT OT to the best of his ability.  Denies any acute uncontrolled symptoms. Objective:   Vital Signs:  BP (!) 170/57 (BP Location: Right Leg)   Pulse 67   Temp 98 F (36.7 C) (Axillary)   Resp (!) 23   Ht 5\' 11"  (1.803 m)   Wt 97.8 kg   SpO2 98%   BMI 30.07 kg/m   Physical Exam: General: Awake, laying in bed, chronically ill-appearing  (hard of hearing as well) Cardiovascular: RRR, edema in the lower extremities bilaterally Respiratory: no increased work of breathing noted Skin: Chronic venous stasis changes in lower extremities bilaterally  Imaging: I personally reviewed recent imaging.   Assessment & Plan:   Assessment: Patient is an 78 year old male with a past medical history of bladder cancer, hypertension, RLS, chronic pain, chronic lymphedema in lower extremities bilaterally, PAD, CKD, and GERD who was transferred from [facility] on 10/18/2023 after a fall at home when was noted had seizure.  Since admission, patient has received management for acute metabolic encephalopathy, acute hypoxic respiratory failure requiring intubation with mechanical ventilation support secondary to pneumonia, and acute onset A-fib with RVR.  Palliative medicine team consulted to assist with complex medical decision making.  Recommendations/Plan: # Complex medical  decision making/goals of care:  - PT note reviewed, TOC note reviewed, likely for SNF.   -  Code Status: Limited: Do not attempt resuscitation (DNR) -DNR-LIMITED -Do Not Intubate/DNI   # Psychosocial Support:  -Wife, son  # Discharge Planning: To Be Determined  Discussed with: Patient Patient states that he wants to be considered for [facility] nursing center in [place], [place] so as to be closer to his family. Thank you for allowing the palliative care team to participate in the care Garrett Ferguson.  Low MDM Garrett Hawking MD.  Palliative Care Provider PMT # [phone]  If patient remains symptomatic despite maximum doses, please call PMT at [phone] between 0700 and 1900. Outside of these hours, please call attending, as PMT does not have night coverage.

## 2023-10-31 NOTE — Plan of Care (Signed)
  Problem: Respiratory: Goal: Ability to maintain adequate ventilation will improve Outcome: Progressing   Problem: Clinical Measurements: Goal: Respiratory complications will improve Outcome: Progressing Goal: Cardiovascular complication will be avoided Outcome: Progressing   Problem: Activity: Goal: Risk for activity intolerance will decrease Outcome: Progressing   Problem: Nutrition: Goal: Adequate nutrition will be maintained Outcome: Progressing   Problem: Coping: Goal: Level of anxiety will decrease Outcome: Progressing   Problem: Elimination: Goal: Will not experience complications related to bowel motility Outcome: Progressing Goal: Will not experience complications related to urinary retention Outcome: Progressing   Problem: Pain Managment: Goal: General experience of comfort will improve and/or be controlled Outcome: Progressing   Problem: Safety: Goal: Ability to remain free from injury will improve Outcome: Progressing   Problem: Role Relationship: Goal: Method of communication will improve Outcome: Progressing   Problem: Skin Integrity: Goal: Risk for impaired skin integrity will decrease Outcome: Not Progressing

## 2023-10-31 NOTE — Progress Notes (Signed)
 PROGRESS NOTE    Garrett Ferguson  NWG:[id number] DOB: July 27, 1946 DOA: 10/18/2023 PCP: [facility]  Chief Complaint  Patient presents with   Fall   Seizures    Brief Narrative:   Garrett Ferguson is Garrett Ferguson 78 year old male with history of bladder cancer,HTN,restless leg syndrome, chronic lymphedema of B/L lower extremity, peripheral artery disease, GERD who was brought initially to [facility] after he fell at home.  EMS found him to be seizing.  He was confused and agitated afterwards, hypothermic, hypotensive then became unresponsive.    On presentation, UA was suspicious for UTI.  Sepsis protocol started.  Transferred to [facility] long ICU.  Code called for respiratory arrest that started on vasopressors, intubated.  No loss of pulse or CPR done.  Palliative care consulted for goals of care discussion.  Currently DNR.  Patient transferred to [facility] service on 3/14.  Clinically improving.  PT/OT recommending SNF on discharge.   Important events: 3/6 tx from [facility] ER to [facility], urosepsis, AMS, intubated on arrival  3/7 difficulty placing foley, ended up having trauma and urology was consulted 3/9 Off of pressors. Documented SBT x 9 hours. made DNR w palliative care.  3/10 Fib RVR w SBT; back to full support, NSR w amio gtt. Abx to doxy rocephin  3/11 fever, ongoing pressors. Weaned for Kasyn Rolph few hours in the late afternoon but was pretty tired.  3/12 one-way extubation, DNR/ DNI Weaned to room air  Assessment & Plan:   Active Problems:   Physical deconditioning   Lymphedema   Hypertension   History of bladder cancer   Hypoalbuminemia   Palliative care encounter   Goals of care, counseling/discussion   Need for emotional support   Counseling and coordination of care  Septic shock - resolved  secondary to pneumonia, with possible contribution from left lower extremity cellulitis. Completed 7 days course of doxycycline and ceftriaxone on 3/12.   Urine, sputum, and blood cultures  negative  Acute hypoxic respiratory failure -resolved - Suspected to be from community-acquired pneumonia, septic shock.  Initially intubated, now extubated and on room air.   Continue pulmonary hygiene, flutter valve, incentive spirometer, bronchodilators as needed.   Hematuria - Likely from traumatic catheterization.  Foley placed by urology on 3/8.  Recommendation is to continue for at least 7 days before restarting voiding trial.   - waiting for hematuria to resolve prior to trial of void   Superficial Vein Thrombosis of L Basilic Vein - no DVT - symptomatic management   Hypertension - BP significantly elevated recently (systolics in the 200's at times) - unfortunately, restricted bilateral upper extremities, using R calf for pressures - appropriate sized cuff selected this AM - will add amlodipine/losartan - continue careful use of prn antihypertensives - goal is gradual reduction in BP   Concern for Seizure Like Activity - EEG without seizures or epileptiform discharges - head CT without acute intracranial abnormality - consider outpatient neurology follow up  Dysphagia - Speech therapy following.  Started on regular diet    Aayden Cefalu-fib with RVR - resolved  - Went into rapid Maddox Hlavaty-fib on 3/10 but spontaneously converted to normal sinus rhythm with amiodarone.   - This was most likely associated with acute respiratory distress.  Would consider discharging with ziopatch to help evaluate afib burden. - Currently on aspirin, statin.  Anticoagulation not felt to be indicated as afib precipitated in setting of acute illness/shock   Normocytic anemia - Currently stable.  Continue to monitor   History  of chronic lymphedema - Continue supportive care, wound care was consulted   Frequent falls/physical deconditioning - Mostly wheelchair dependent at home since March 15, 2023.  PT/OT consulted.  SNF recommended   Hyperglycemia - Currently on sliding scale.  A1c of 5.8.  Blood sugar stable  now   History of restless leg syndrome/chronic pain syndrome/anxiety/depression - On as needed oxycodone, hydroxyzine   Goals of care - Palliative care following.  DNR/DNI - Goal is to continue current care but family opting for comfort care if further declines.  Patient is stable at this time but overall low functioning      DVT prophylaxis: heparin Code Status: DNR Family Communication: none Disposition:   Status is: Inpatient Remains inpatient appropriate because: pending stability for discharge   Consultants:  none  Procedures:  Echo 3/7 IMPRESSIONS     1. Abnormal septal motion. Left ventricular ejection fraction, by  estimation, is 60 to 65%. The left ventricle has normal function. The left  ventricle has no regional wall motion abnormalities. Left ventricular  diastolic parameters were normal.   2. Right ventricular systolic function is moderately reduced. The right  ventricular size is moderately enlarged.   3. Left atrial size was severely dilated.   4. The mitral valve is abnormal. Trivial mitral valve regurgitation. No  evidence of mitral stenosis.   5. Tricuspid valve regurgitation is mild to moderate.   6. The aortic valve is tricuspid. There is moderate calcification of the  aortic valve. There is moderate thickening of the aortic valve. Aortic  valve regurgitation is not visualized. Aortic valve sclerosis is present,  with no evidence of aortic valve  stenosis.   7. The inferior vena cava is dilated in size with <50% respiratory  variability, suggesting right atrial pressure of 15 mmHg.   LE [place] Summary:    Right:  No evidence of thrombosis in the subclavian.    Left:  No evidence of deep vein thrombosis in the upper extremity. Findings  consistent  with acute superficial vein thrombosis involving the left basilic vein.   Antimicrobials:  Anti-infectives (From admission, onward)    Start     Dose/Rate Route Frequency Ordered Stop   10/22/23  2200  doxycycline (VIBRAMYCIN) 100 mg in sodium chloride 0.9 % 250 mL IVPB        100 mg 125 mL/hr over 120 Minutes Intravenous Every 12 hours 10/22/23 0807 10/25/23 0038   10/22/23 1000  cefTRIAXone (ROCEPHIN) 2 g in sodium chloride 0.9 % 100 mL IVPB        2 g 200 mL/hr over 30 Minutes Intravenous Every 24 hours 10/22/23 0807 10/24/23 1352   10/20/23 2200  vancomycin (VANCOREADY) IVPB 1500 mg/300 mL  Status:  Discontinued        1,500 mg 150 mL/hr over 120 Minutes Intravenous Every 24 hours 10/20/23 0922 10/22/23 0807   10/19/23 2200  vancomycin (VANCOREADY) IVPB 1750 mg/350 mL  Status:  Discontinued        1,750 mg 175 mL/hr over 120 Minutes Intravenous Every 24 hours 10/18/23 2223 10/20/23 0922   10/19/23 1200  metroNIDAZOLE (FLAGYL) IVPB 500 mg  Status:  Discontinued        500 mg 100 mL/hr over 60 Minutes Intravenous Every 12 hours 10/19/23 1042 10/22/23 0807   10/19/23 1130  cefTRIAXone (ROCEPHIN) 2 g in sodium chloride 0.9 % 100 mL IVPB  Status:  Discontinued        2 g 200 mL/hr over 30 Minutes Intravenous  Every 24 hours 10/19/23 1042 10/22/23 0807   10/18/23 2230  vancomycin (VANCOREADY) IVPB 2000 mg/400 mL        2,000 mg 200 mL/hr over 120 Minutes Intravenous  Once 10/18/23 2130 10/19/23 0056   10/18/23 2200  ceFEPIme (MAXIPIME) 2 g in sodium chloride 0.9 % 100 mL IVPB  Status:  Discontinued        2 g 200 mL/hr over 30 Minutes Intravenous Every 8 hours 10/18/23 1533 10/19/23 1042   10/18/23 1530  ceFEPIme (MAXIPIME) 2 g in sodium chloride 0.9 % 100 mL IVPB        2 g 200 mL/hr over 30 Minutes Intravenous  Once 10/18/23 1446 10/18/23 1614   10/18/23 1215  cefTRIAXone (ROCEPHIN) 2 g in sodium chloride 0.9 % 100 mL IVPB        2 g 200 mL/hr over 30 Minutes Intravenous Once 10/18/23 1206 10/18/23 1325       Subjective: No complaints today  Objective: Vitals:   10/31/23 0340 10/31/23 0400 10/31/23 0500 10/31/23 0750  BP:      Pulse:  71 72   Resp:  19 18   Temp:  98.7 F (37.1 C)   (!) 97.5 F (36.4 C)  TempSrc: Axillary   Oral  SpO2:  100% 100%   Weight:      Height:        Intake/Output Summary (Last 24 hours) at 10/31/2023 0854 Last data filed at 10/31/2023 0813 Gross per 24 hour  Intake 60 ml  Output 1275 ml  Net -1215 ml   Filed Weights   10/28/23 0500 10/29/23 0500 10/30/23 0500  Weight: 100.1 kg 97.8 kg 97.8 kg    Examination:  General: No acute distress. Cardiovascular: RRR Lungs: Clear to auscultation bilaterally  Abdomen: Soft, nontender, nondistended  Neurological: Alert and oriented 3. Moves all extremities 4 with equal strength. Cranial nerves II through XII grossly intact. Extremities: lymphedema to bilateral LE's - dressing to LLE (asked RN to let me know when dressing to be changed)  Data Reviewed: I have personally reviewed following labs and imaging studies  CBC: Recent Labs  Lab 10/25/23 0441 10/26/23 0456 10/27/23 0544  WBC 8.9 9.4 9.7  NEUTROABS 7.1  --   --   HGB 9.6* 9.7* 10.4*  HCT 31.4* 32.3* 33.8*  MCV 97.8 97.3 95.8  PLT 156 220 301    Basic Metabolic Panel: Recent Labs  Lab 10/25/23 0441 10/26/23 0456 10/27/23 0544  NA 131* 140 138  K 3.8 3.9 4.1  CL 96* 106 101  CO2 26 26 28   GLUCOSE 328* 112* 96  BUN 34* 30* 28*  CREATININE 0.64 0.57* 0.62  CALCIUM 7.7* 7.9* 8.6*  MG 1.9 2.1  --   PHOS 2.9  --   --     GFR: Estimated Creatinine Clearance: 90.7 mL/min (by C-G formula based on SCr of 0.62 mg/dL).  Liver Function Tests: No results for input(s): "AST", "ALT", "ALKPHOS", "BILITOT", "PROT", "ALBUMIN" in the last 168 hours.  CBG: Recent Labs  Lab 10/26/23 1157 10/26/23 1553 10/26/23 2108 10/27/23 0753 10/27/23 1151  GLUCAP 103* 109* 110* 101* 113*     No results found for this or any previous visit (from the past 240 hours).       Radiology Studies: VAS [place] UPPER EXTREMITY VENOUS DUPLEX Result Date: 10/30/2023 UPPER VENOUS STUDY  Patient Name:  JAQWAN WIEBER   Date of Exam:   10/30/2023 Medical Rec #: [id number]  Accession #:    [id number] Date of Birth: 01/17/1946        Patient Gender: M Patient Age:   57 years Exam Location:  [facility] Procedure:      VAS [place] UPPER EXTREMITY VENOUS DUPLEX Referring Phys: Lewie Chamber --------------------------------------------------------------------------------  Indications: concern for DVT/SVT Risk Factors: None identified. Comparison Study: No prior studies. Performing Technologist: Chanda Busing RVT  Examination Guidelines: Jori Frerichs complete evaluation includes B-mode imaging, spectral Doppler, color Doppler, and power Doppler as needed of all accessible portions of each vessel. Bilateral testing is considered an integral part of Jahkai Yandell complete examination. Limited examinations for reoccurring indications may be performed as noted.  Right Findings: +----------+------------+---------+-----------+----------+-------+ RIGHT     CompressiblePhasicitySpontaneousPropertiesSummary +----------+------------+---------+-----------+----------+-------+ Subclavian    Full       Yes       Yes                      +----------+------------+---------+-----------+----------+-------+  Left Findings: +----------+------------+---------+-----------+----------+-------+ LEFT      CompressiblePhasicitySpontaneousPropertiesSummary +----------+------------+---------+-----------+----------+-------+ IJV           Full       Yes       Yes                      +----------+------------+---------+-----------+----------+-------+ Subclavian    Full       Yes       Yes                      +----------+------------+---------+-----------+----------+-------+ Axillary      Full       Yes       Yes                      +----------+------------+---------+-----------+----------+-------+ Brachial      Full                                          +----------+------------+---------+-----------+----------+-------+ Radial         Full                                          +----------+------------+---------+-----------+----------+-------+ Ulnar         Full                                          +----------+------------+---------+-----------+----------+-------+ Cephalic      Full                                          +----------+------------+---------+-----------+----------+-------+ Basilic       None                                   Acute  +----------+------------+---------+-----------+----------+-------+ Thrombus detected in the basilic vein is noted to extend from the [facility] into the proximal forearm.  Summary:  Right: No evidence of thrombosis in the subclavian.  Left: No evidence of deep vein thrombosis in the upper extremity. Findings consistent with  acute superficial vein thrombosis involving the left basilic vein.  *See table(s) above for measurements and observations.  Diagnosing physician: Lemar Livings MD Electronically signed by Lemar Livings MD on 10/30/2023 at 3:44:29 PM.    Final         Scheduled Meds:  amLODipine  5 mg Oral Daily   aspirin  81 mg Oral Daily   atorvastatin  10 mg Oral Daily   bisacodyl  10 mg Oral Daily   Chlorhexidine Gluconate Cloth  6 each Topical Daily   docusate sodium  100 mg Oral BID   feeding supplement  237 mL Oral BID BM   gabapentin  100 mg Oral BID   heparin  5,000 Units Subcutaneous Q8H   losartan  25 mg Oral Daily   multivitamin with minerals  1 tablet Oral Daily   naphazoline-glycerin  2 drop Left Eye QID   polyethylene glycol  17 g Oral Daily   pramipexole  0.125 mg Oral TID   prednisoLONE acetate  1 drop Right Eye TID   Continuous Infusions:   LOS: 13 days    Time spent: over 30 min    Lacretia Nicks, MD [facility]   To contact the attending provider between 7A-7P or the covering provider during after hours 7P-7A, please log into the web site [email or web address] and access using universal [place] password for that web  site. If you do not have the password, please call the hospital operator.  10/31/2023, 8:54 AM

## 2023-11-01 ENCOUNTER — Inpatient Hospital Stay

## 2023-11-01 ENCOUNTER — Other Ambulatory Visit: Payer: Self-pay | Admitting: Home Health

## 2023-11-01 DIAGNOSIS — A419 Sepsis, unspecified organism: Secondary | ICD-10-CM | POA: Diagnosis not present

## 2023-11-01 DIAGNOSIS — R6521 Severe sepsis with septic shock: Secondary | ICD-10-CM | POA: Diagnosis not present

## 2023-11-01 DIAGNOSIS — I4891 Unspecified atrial fibrillation: Secondary | ICD-10-CM

## 2023-11-01 LAB — COMPREHENSIVE METABOLIC PANEL
ALT: 44 U/L (ref 0–44)
AST: 30 U/L (ref 15–41)
Albumin: 2.7 g/dL — ABNORMAL LOW (ref 3.5–5.0)
Alkaline Phosphatase: 146 U/L — ABNORMAL HIGH (ref 38–126)
Anion gap: 7 (ref 5–15)
BUN: 20 mg/dL (ref 8–23)
CO2: 26 mmol/L (ref 22–32)
Calcium: 8.2 mg/dL — ABNORMAL LOW (ref 8.9–10.3)
Chloride: 102 mmol/L (ref 98–111)
Creatinine, Ser: 0.51 mg/dL — ABNORMAL LOW (ref 0.61–1.24)
GFR, Estimated: >60 mL/min (ref >60)
Glucose, Bld: 108 mg/dL — ABNORMAL HIGH (ref 70–99)
Potassium: 3.2 mmol/L — ABNORMAL LOW (ref 3.5–5.1)
Sodium: 135 mmol/L (ref 135–145)
Total Bilirubin: 0.7 mg/dL (ref 0.0–1.2)
Total Protein: 6.2 g/dL — ABNORMAL LOW (ref 6.5–8.1)

## 2023-11-01 LAB — CBC WITH DIFFERENTIAL/PLATELET
Abs Immature Granulocytes: 0.07 10*3/uL (ref 0.00–0.07)
Basophils Absolute: 0 10*3/uL (ref 0.0–0.1)
Basophils Relative: 1 %
Eosinophils Absolute: 0.4 10*3/uL (ref 0.0–0.5)
Eosinophils Relative: 5 %
HCT: 36 % — ABNORMAL LOW (ref 39.0–52.0)
Hemoglobin: 10.9 g/dL — ABNORMAL LOW (ref 13.0–17.0)
Immature Granulocytes: 1 %
Lymphocytes Relative: 12 %
Lymphs Abs: 1 10*3/uL (ref 0.7–4.0)
MCH: 29.6 pg (ref 26.0–34.0)
MCHC: 30.3 g/dL (ref 30.0–36.0)
MCV: 97.8 fL (ref 80.0–100.0)
Monocytes Absolute: 0.4 10*3/uL (ref 0.1–1.0)
Monocytes Relative: 5 %
Neutro Abs: 6.4 10*3/uL (ref 1.7–7.7)
Neutrophils Relative %: 76 %
Platelets: 573 10*3/uL — ABNORMAL HIGH (ref 150–400)
RBC: 3.68 MIL/uL — ABNORMAL LOW (ref 4.22–5.81)
RDW: 14.6 % (ref 11.5–15.5)
WBC: 8.3 10*3/uL (ref 4.0–10.5)
nRBC: 0 % (ref 0.0–0.2)

## 2023-11-01 LAB — MAGNESIUM: Magnesium: 2.1 mg/dL (ref 1.7–2.4)

## 2023-11-01 LAB — PHOSPHORUS: Phosphorus: 3.2 mg/dL (ref 2.5–4.6)

## 2023-11-01 MED ORDER — OXYCODONE HCL 5 MG PO TABS: 5.0000 mg | ORAL_TABLET | ORAL | Status: DC | PRN

## 2023-11-01 MED ORDER — FUROSEMIDE 40 MG PO TABS
40.0000 mg | ORAL_TABLET | Freq: Every day | ORAL | Status: DC
  Administered 2023-11-01 – 2023-11-03 (×3): 40 mg via ORAL
  Filled 2023-11-01 (×3): qty 1

## 2023-11-01 MED ORDER — OXYCODONE HCL 5 MG PO TABS
5.0000 mg | ORAL_TABLET | ORAL | Status: DC | PRN
  Administered 2023-11-01 – 2023-11-03 (×6): 10 mg via ORAL
  Filled 2023-11-01 (×6): qty 2

## 2023-11-01 MED ORDER — POTASSIUM CHLORIDE CRYS ER 20 MEQ PO TBCR
40.0000 meq | EXTENDED_RELEASE_TABLET | ORAL | Status: AC
  Administered 2023-11-01 (×2): 40 meq via ORAL
  Filled 2023-11-01 (×2): qty 2

## 2023-11-01 NOTE — TOC Progression Note (Addendum)
 Transition of Care [facility]) - Progression Note    Patient Details  Name: Garrett Ferguson MRN: [id number] Date of Birth: [birth year]  Transition of Care [facility]) CM/SW Contact  Larrie Kass, LCSW Phone Number: 11/01/2023, 12:44 PM  Clinical Narrative:     CSW met with the pt to present bed offers and provided the Medicare.gov star rating list. Pt is requesting time to review the offers with her spouse when she arrives at the hospital around 3 p.m. CSW informed the pt that she is stable for d/c but will still need insurance authorization. CSW will follow up after 3 p.m.    [facility] [address] [place], [place] [address] [phone] Overall rating ???? Above average  [facility] [address] [place], [place] [address] [phone] Overall rating ?? Below average  [facility] [address] [place], [place] [address] [phone] Overall rating ???  Average  [facility] [address] [place], [place] [address] [phone] Overall rating ? Much below average  [facility] [address] [place], [place] [address] [phone] Overall rating? Below average   ADDEN 3:30pm  CSW spoke with pt and spouse they have chosen [facility]. CSW has sent message to Revonda Standard and will contact [employer] to start insurance authorization. TOC to follow.    Expected Discharge Plan: Skilled Nursing Facility Barriers to Discharge: English as a second language teacher, SNF Pending bed offer  Expected Discharge Plan and Services In-house Referral: NA Discharge Planning Services: NA   Living arrangements for the past 2 months: Single Family Home                   DME Agency: NA       HH Arranged: NA           Social Determinants of Health (SDOH) Interventions SDOH Screenings   Food Insecurity: Patient Unable To Answer (10/21/2023)  Housing:  Patient Unable To Answer (10/21/2023)  Transportation Needs: Patient Unable To Answer (10/21/2023)  Utilities: Patient Unable To Answer (10/21/2023)  Financial Resource Strain: Medium Risk (04/06/2021)   Received from [facility], [facility]  Physical Activity: Insufficiently Active (02/23/2021)   Received from [facility], [facility]  Social Connections: Patient Unable To Answer (10/21/2023)  Stress: No Stress Concern Present (02/23/2021)   Received from [facility], [facility]  Tobacco Use: Low Risk  (10/18/2023)  Health Literacy: Low Risk  (02/23/2021)   Received from [facility], [facility]    Readmission Risk Interventions    10/22/2023    1:09 PM  Readmission Risk Prevention Plan  Transportation Screening Complete  PCP or Specialist Appt within 5-7 Days Complete  Home Care Screening Complete  Medication Review (RN CM) Complete

## 2023-11-01 NOTE — Progress Notes (Addendum)
 PROGRESS NOTE    Garrett Ferguson  WGN:[id number] DOB: 04-04-1946 DOA: 10/18/2023 PCP: [facility]  Chief Complaint  Patient presents with   Fall   Seizures    Brief Narrative:   Garrett Ferguson is Garrett Ferguson 78 year old male with history of bladder cancer,HTN,restless leg syndrome, chronic lymphedema of B/L lower extremity, peripheral artery disease, GERD who was brought initially to [facility] after he fell at home.  EMS found him to be seizing.  He was confused and agitated afterwards, hypothermic, hypotensive then became unresponsive.    On presentation, UA was suspicious for UTI.  Sepsis protocol started.  Transferred to [facility] long ICU.  Code called for respiratory arrest that started on vasopressors, intubated.  No loss of pulse or CPR done.  Palliative care consulted for goals of care discussion.  Currently DNR.  Patient transferred to [facility] service on 3/14.  Clinically improving.  PT/OT recommending SNF on discharge.   Important events: 3/6 tx from [facility] ER to [facility], urosepsis, AMS, intubated on arrival  3/7 difficulty placing foley, ended up having trauma and urology was consulted 3/9 Off of pressors. Documented SBT x 9 hours. made DNR w palliative care.  3/10 Fib RVR w SBT; back to full support, NSR w amio gtt. Abx to doxy rocephin  3/11 fever, ongoing pressors. Weaned for Garrett Ferguson few hours in the late afternoon but was pretty tired.  3/12 one-way extubation, DNR/ DNI Weaned to room air.  3/20 foley discontinued  Appears stable for discharge at this point.  Working on SNF placement.   Assessment & Plan:   Active Problems:   Physical deconditioning   Lymphedema   Hypertension   History of bladder cancer   Hypoalbuminemia   Palliative care encounter   Goals of care, counseling/discussion   Need for emotional support   Counseling and coordination of care  Septic shock - resolved  secondary to pneumonia, with possible contribution from left lower extremity cellulitis.  Completed 7 days course of doxycycline and ceftriaxone on 3/12.   Urine, sputum, and blood cultures negative  Acute hypoxic respiratory failure -resolved - Suspected to be from community-acquired pneumonia, septic shock.  Initially intubated, now extubated and on room air.   Continue pulmonary hygiene, flutter valve, incentive spirometer, bronchodilators as needed.   Hematuria - Likely from traumatic catheterization.  Foley placed by urology on 3/8.  Recommendation is to continue for at least 7 days before restarting voiding trial.   - will d/c foley today 3/20 (12 days after foley placement).  If fails voiding trial, would discuss with urology prior to replacement given prior difficulty with placement.    Superficial Vein Thrombosis of L Basilic Vein - no DVT - symptomatic management   Hypertension - BP improved (was as high as the 200's) - unfortunately, restricted bilateral upper extremities, using R calf for pressures - ensure appropriately sized cuff - start amlodipine/losartan - continue careful use of prn antihypertensives - goal is gradual reduction in BP   Concern for Seizure Like Activity - at presentation, concern for seizure like activity.  Not formally evaluated by neurology.  ? If related to hypotension/shock. - EEG without seizures or epileptiform discharges - head CT without acute intracranial abnormality - consider outpatient neurology follow up  Dysphagia - Speech therapy following.  Started on regular diet    Garrett Ferguson-fib with RVR - resolved  - Went into rapid Taquanna Borras-fib on 3/10 but spontaneously converted to normal sinus rhythm with amiodarone.   - This was most  likely associated with acute respiratory distress.  Would ask cards to help [place] arrange  ziopatch to help evaluate afib burden at discharge. - Currently on aspirin, statin.  Anticoagulation not felt to be indicated as afib precipitated in setting of acute illness/shock   Normocytic anemia - Currently stable.   Continue to monitor   History of chronic lymphedema Bilateral Lower Extremity Edema - Continue supportive care, wound care was consulted - recent echo with EF 60-65%, no RWMA, moderately reduced RVSF, severely dilated LA, IVC dilated with <50% resp variability - BNP mildly elevated - will add lasix daily for diuresis - strict I/O, daily weights   Frequent falls/physical deconditioning Chronic Lower Extremity Weakness - significant L>R LE weakness - denies numbness or saddle anesthesia - seems to be longstanding - has history of multiple surgeries to L knee and chronic pain related to that.  Weakness could be related to chronic deconditioning related to pain and lymphedema.  Could consider additional w/u with MRI spine, but given chronicity, would want to discuss additionally first (discussed with wife - LLE weakness on order of months - with his chronic pain to that leg, that may be more likely cause of weakness - I think would discuss with Mr. Loftus additionally before putting him through MRI which I'm not sure would be revealing) - Mostly wheelchair dependent at home since March 15, 2023.  PT/OT consulted.  SNF recommended   Hyperglycemia - Currently on sliding scale.  A1c of 5.8.  Blood sugar stable now   History of restless leg syndrome/chronic pain syndrome/anxiety/depression - On as needed oxycodone, hydroxyzine   Goals of care - Palliative care following.  DNR/DNI - Goal is to continue current care but family had opted for comfort care if he declined further (at this point, he's stabilized - I think will continue to be stable in the immediate future).       DVT prophylaxis: heparin Code Status: DNR Family Communication: none Disposition:   Status is: Inpatient Remains inpatient appropriate because: pending stability for discharge   Consultants:  none  Procedures:  Echo 3/7 IMPRESSIONS     1. Abnormal septal motion. Left ventricular ejection fraction, by   estimation, is 60 to 65%. The left ventricle has normal function. The left  ventricle has no regional wall motion abnormalities. Left ventricular  diastolic parameters were normal.   2. Right ventricular systolic function is moderately reduced. The right  ventricular size is moderately enlarged.   3. Left atrial size was severely dilated.   4. The mitral valve is abnormal. Trivial mitral valve regurgitation. No  evidence of mitral stenosis.   5. Tricuspid valve regurgitation is mild to moderate.   6. The aortic valve is tricuspid. There is moderate calcification of the  aortic valve. There is moderate thickening of the aortic valve. Aortic  valve regurgitation is not visualized. Aortic valve sclerosis is present,  with no evidence of aortic valve  stenosis.   7. The inferior vena cava is dilated in size with <50% respiratory  variability, suggesting right atrial pressure of 15 mmHg.   LE [place] Summary:    Right:  No evidence of thrombosis in the subclavian.    Left:  No evidence of deep vein thrombosis in the upper extremity. Findings  consistent  with acute superficial vein thrombosis involving the left basilic vein.   Antimicrobials:  Anti-infectives (From admission, onward)    Start     Dose/Rate Route Frequency Ordered Stop   10/22/23 2200  doxycycline (  VIBRAMYCIN) 100 mg in sodium chloride 0.9 % 250 mL IVPB        100 mg 125 mL/hr over 120 Minutes Intravenous Every 12 hours 10/22/23 0807 10/25/23 0038   10/22/23 1000  cefTRIAXone (ROCEPHIN) 2 g in sodium chloride 0.9 % 100 mL IVPB        2 g 200 mL/hr over 30 Minutes Intravenous Every 24 hours 10/22/23 0807 10/24/23 1352   10/20/23 2200  vancomycin (VANCOREADY) IVPB 1500 mg/300 mL  Status:  Discontinued        1,500 mg 150 mL/hr over 120 Minutes Intravenous Every 24 hours 10/20/23 0922 10/22/23 0807   10/19/23 2200  vancomycin (VANCOREADY) IVPB 1750 mg/350 mL  Status:  Discontinued        1,750 mg 175 mL/hr over 120  Minutes Intravenous Every 24 hours 10/18/23 2223 10/20/23 0922   10/19/23 1200  metroNIDAZOLE (FLAGYL) IVPB 500 mg  Status:  Discontinued        500 mg 100 mL/hr over 60 Minutes Intravenous Every 12 hours 10/19/23 1042 10/22/23 0807   10/19/23 1130  cefTRIAXone (ROCEPHIN) 2 g in sodium chloride 0.9 % 100 mL IVPB  Status:  Discontinued        2 g 200 mL/hr over 30 Minutes Intravenous Every 24 hours 10/19/23 1042 10/22/23 0807   10/18/23 2230  vancomycin (VANCOREADY) IVPB 2000 mg/400 mL        2,000 mg 200 mL/hr over 120 Minutes Intravenous  Once 10/18/23 2130 10/19/23 0056   10/18/23 2200  ceFEPIme (MAXIPIME) 2 g in sodium chloride 0.9 % 100 mL IVPB  Status:  Discontinued        2 g 200 mL/hr over 30 Minutes Intravenous Every 8 hours 10/18/23 1533 10/19/23 1042   10/18/23 1530  ceFEPIme (MAXIPIME) 2 g in sodium chloride 0.9 % 100 mL IVPB        2 g 200 mL/hr over 30 Minutes Intravenous  Once 10/18/23 1446 10/18/23 1614   10/18/23 1215  cefTRIAXone (ROCEPHIN) 2 g in sodium chloride 0.9 % 100 mL IVPB        2 g 200 mL/hr over 30 Minutes Intravenous Once 10/18/23 1206 10/18/23 1325       Subjective: Feels down today  Objective: Vitals:   11/01/23 0500 11/01/23 0700 11/01/23 0800 11/01/23 0900  BP:   (!) 168/69 (!) 153/56  Pulse:  80 79 78  Resp:  (!) 23 19 (!) 28  Temp:   97.8 F (36.6 C)   TempSrc:   Axillary   SpO2:  100% 100% 100%  Weight: 97 kg     Height:        Intake/Output Summary (Last 24 hours) at 11/01/2023 0944 Last data filed at 11/01/2023 0900 Gross per 24 hour  Intake 240 ml  Output 500 ml  Net -260 ml   Filed Weights   10/29/23 0500 10/30/23 0500 11/01/23 0500  Weight: 97.8 kg 97.8 kg 97 kg    Examination:  General: No acute distress. Cardiovascular: RRR Lungs:  unlabored Abdomen: Soft, nontender, nondistended  Neurological: Alert and oriented 3. Chronic L>R LE weakness (unable to lift left heel off bed) Extremities: bilateral LE edema - dressing  removed to LLE, compared to prior images (from 3/7), wounds (shallow ulcerations) appear to be improving   Data Reviewed: I have personally reviewed following labs and imaging studies  CBC: Recent Labs  Lab 10/26/23 0456 10/27/23 0544 11/01/23 0556  WBC 9.4 9.7 8.3  NEUTROABS  --   --  6.4  HGB 9.7* 10.4* 10.9*  HCT 32.3* 33.8* 36.0*  MCV 97.3 95.8 97.8  PLT 220 301 573*    Basic Metabolic Panel: Recent Labs  Lab 10/26/23 0456 10/27/23 0544 11/01/23 0556  NA 140 138 135  K 3.9 4.1 3.2*  CL 106 101 102  CO2 26 28 26   GLUCOSE 112* 96 108*  BUN 30* 28* 20  CREATININE 0.57* 0.62 0.51*  CALCIUM 7.9* 8.6* 8.2*  MG 2.1  --  2.1  PHOS  --   --  3.2    GFR: Estimated Creatinine Clearance: 90.4 mL/min (Garrett Ferguson) (by C-G formula based on SCr of 0.51 mg/dL (L)).  Liver Function Tests: Recent Labs  Lab 11/01/23 0556  AST 30  ALT 44  ALKPHOS 146*  BILITOT 0.7  PROT 6.2*  ALBUMIN 2.7*    CBG: Recent Labs  Lab 10/26/23 1157 10/26/23 1553 10/26/23 2108 10/27/23 0753 10/27/23 1151  GLUCAP 103* 109* 110* 101* 113*     No results found for this or any previous visit (from the past 240 hours).       Radiology Studies: VAS [place] UPPER EXTREMITY VENOUS DUPLEX Result Date: 10/30/2023 UPPER VENOUS STUDY  Patient Name:  Garrett Ferguson  Date of Exam:   10/30/2023 Medical Rec #: [id number]        Accession #:    [id number] Date of Birth: 24-Aug-1945        Patient Gender: M Patient Age:   11 years Exam Location:  [facility] Procedure:      VAS [place] UPPER EXTREMITY VENOUS DUPLEX Referring Phys: Lewie Chamber --------------------------------------------------------------------------------  Indications: concern for DVT/SVT Risk Factors: None identified. Comparison Study: No prior studies. Performing Technologist: Chanda Busing RVT  Examination Guidelines: Garrett Ferguson complete evaluation includes B-mode imaging, spectral Doppler, color Doppler, and power Doppler as needed of all accessible  portions of each vessel. Bilateral testing is considered an integral part of Garrett Ferguson complete examination. Limited examinations for reoccurring indications may be performed as noted.  Right Findings: +----------+------------+---------+-----------+----------+-------+ RIGHT     CompressiblePhasicitySpontaneousPropertiesSummary +----------+------------+---------+-----------+----------+-------+ Subclavian    Full       Yes       Yes                      +----------+------------+---------+-----------+----------+-------+  Left Findings: +----------+------------+---------+-----------+----------+-------+ LEFT      CompressiblePhasicitySpontaneousPropertiesSummary +----------+------------+---------+-----------+----------+-------+ IJV           Full       Yes       Yes                      +----------+------------+---------+-----------+----------+-------+ Subclavian    Full       Yes       Yes                      +----------+------------+---------+-----------+----------+-------+ Axillary      Full       Yes       Yes                      +----------+------------+---------+-----------+----------+-------+ Brachial      Full                                          +----------+------------+---------+-----------+----------+-------+ Radial        Full                                          +----------+------------+---------+-----------+----------+-------+  Ulnar         Full                                          +----------+------------+---------+-----------+----------+-------+ Cephalic      Full                                          +----------+------------+---------+-----------+----------+-------+ Basilic       None                                   Acute  +----------+------------+---------+-----------+----------+-------+ Thrombus detected in the basilic vein is noted to extend from the [facility] into the proximal forearm.  Summary:  Right: No evidence of  thrombosis in the subclavian.  Left: No evidence of deep vein thrombosis in the upper extremity. Findings consistent with acute superficial vein thrombosis involving the left basilic vein.  *See table(s) above for measurements and observations.  Diagnosing physician: Lemar Livings MD Electronically signed by Lemar Livings MD on 10/30/2023 at 3:44:29 PM.    Final         Scheduled Meds:  amLODipine  5 mg Oral Daily   aspirin  81 mg Oral Daily   atorvastatin  10 mg Oral Daily   bisacodyl  10 mg Oral Daily   Chlorhexidine Gluconate Cloth  6 each Topical Daily   docusate sodium  100 mg Oral BID   feeding supplement  237 mL Oral BID BM   gabapentin  100 mg Oral BID   heparin  5,000 Units Subcutaneous Q8H   losartan  25 mg Oral Daily   multivitamin with minerals  1 tablet Oral Daily   naphazoline-glycerin  2 drop Left Eye QID   polyethylene glycol  17 g Oral Daily   potassium chloride  40 mEq Oral Q4H   pramipexole  0.125 mg Oral TID   prednisoLONE acetate  1 drop Right Eye TID   Continuous Infusions:   LOS: 14 days    Time spent: over 30 min    Lacretia Nicks, MD [facility]   To contact the attending provider between 7A-7P or the covering provider during after hours 7P-7A, please log into the web site [email or web address] and access using universal [place] password for that web site. If you do not have the password, please call the hospital operator.  11/01/2023, 9:44 AM

## 2023-11-01 NOTE — Progress Notes (Signed)
 2 week Zio for A fib burden assessment, Dr Tenny Craw to read.

## 2023-11-01 NOTE — Progress Notes (Signed)
 Physical Therapy Treatment Patient Details Name: Garrett Ferguson MRN: [id number] DOB: 06/15/1946 Today's Date: 11/01/2023   History of Present Illness patient is a 78 year old male who brought to ED via  EMS on 10/18/23 with  falls, seizure, hypotension, hypothermia, sepsis, respiratory arrest requiring uintubation. Extubated 10/24/23. NWG:NFAOZHY cancer, HTN, RLS, Chronic Lymphedema LE, PAD, CKD, GERD, L hand RSD, L TKA x 3, TURP    PT Comments  The patient is cheerful, family visiting. Patient assisted to sitting on bed edge x 10". Requires total assistance to mobilize.  Patient reports significant LLE pain, knee flexion contracture.   Patient will benefit from continued inpatient follow up therapy, <3 hours/day. Patient does demonstrate good participation and tolerated  well today.    If plan is discharge home, recommend the following: Two people to help with walking and/or transfers;Two people to help with bathing/dressing/bathroom;Help with stairs or ramp for entrance;Assistance with cooking/housework   Can travel by private vehicle        Equipment Recommendations  None recommended by PT    Recommendations for Other Services       Precautions / Restrictions Precautions Precautions: Fall Precaution/Restrictions Comments: severe L knee pain, multiple TKA revisions, lymphadema Restrictions Weight Bearing Restrictions Per Provider Order: No     Mobility  Bed Mobility Overal bed mobility: Needs Assistance Bed Mobility: Supine to Sit, Sit to Supine     Supine to sit: Total assist, +2 for physical assistance, +2 for safety/equipment Sit to supine: +2 for physical assistance, +2 for safety/equipment, Total assist   General bed mobility comments: purple slide sheet used  assist sliding on bed with bed  pad to move to sitting, assist with legs and trunk, and back to supine. patient is not really able to assit    Transfers                   General transfer comment:  unable, requires lift    Ambulation/Gait                   Stairs             Wheelchair Mobility     Tilt Bed    Modified Rankin (Stroke Patients Only)       Balance Overall balance assessment: Needs assistance Sitting-balance support: No upper extremity supported, Feet supported Sitting balance-Leahy Scale: Fair Sitting balance - Comments: able to balance and  worked on neck ROM Sat x ~ 10"                                    Communication Communication Communication: No apparent difficulties Factors Affecting Communication: Hearing impaired  Cognition Arousal: Alert Behavior During Therapy: WFL for tasks assessed/performed   PT - Cognitive impairments: Memory                       PT - Cognition Comments: much more awake and alert and talkative Following commands: Intact      Cueing Cueing Techniques: Verbal cues, Gestural cues, Tactile cues  Exercises General Exercises - Lower Extremity Long Arc Quad: AROM, Both, 10 reps    General Comments        Pertinent Vitals/Pain Pain Assessment Faces Pain Scale: Hurts worst Pain Location: L side, left leg Pain Descriptors / Indicators: Aching, Tightness, Constant, Grimacing, Crying, Moaning Pain Intervention(s): Limited activity within patient's tolerance, Monitored during  session, Repositioned    Home Living                          Prior Function            PT Goals (current goals can now be found in the care plan section) Progress towards PT goals: Progressing toward goals    Frequency    Min 2X/week      PT Plan      Co-evaluation              AM-PAC PT "6 Clicks" Mobility   Outcome Measure  Help needed turning from your back to your side while in a flat bed without using bedrails?: Total Help needed moving from lying on your back to sitting on the side of a flat bed without using bedrails?: Total Help needed moving to and from a bed  to a chair (including a wheelchair)?: Total Help needed standing up from a chair using your arms (e.g., wheelchair or bedside chair)?: Total Help needed to walk in hospital room?: Total Help needed climbing 3-5 steps with a railing? : Total 6 Click Score: 6    End of Session   Activity Tolerance: Patient tolerated treatment well Patient left: in bed;with call bell/phone within reach;with bed alarm set Nurse Communication: Mobility status;Need for lift equipment PT Visit Diagnosis: Unsteadiness on feet (R26.81);Muscle weakness (generalized) (M62.81);Pain Pain - Right/Left: Left Pain - part of body: Knee     Time: 9147-8295 PT Time Calculation (min) (ACUTE ONLY): 26 min  Charges:    $Therapeutic Activity: 23-37 mins PT General Charges $$ ACUTE PT VISIT: 1 Visit                     Blanchard Kelch PT Acute Rehabilitation Services Office [phone]     Rada Hay 11/01/2023, 4:31 PM

## 2023-11-01 NOTE — TOC Progression Note (Signed)
 Transition of Care [facility]) - Progression Note    Patient Details  Name: Garrett Ferguson MRN: [id number] Date of Birth: Nov 27, 1945  [facility] CM/SW Contact  Beckie Busing, RN Phone Number:[phone]  11/01/2023, 2:41 PM  Clinical Narrative:    CM spoke with wife Barnaby Rippeon make her aware of bed offers. Kathie Rhodes states that she is on her way to hospital now and will discuss options with patient. CM updated Christina SW covering.    Expected Discharge Plan: Skilled Nursing Facility Barriers to Discharge: English as a second language teacher, SNF Pending bed offer  Expected Discharge Plan and Services In-house Referral: NA Discharge Planning Services: NA   Living arrangements for the past 2 months: Single Family Home                   DME Agency: NA       HH Arranged: NA           Social Determinants of Health (SDOH) Interventions SDOH Screenings   Food Insecurity: Patient Unable To Answer (10/21/2023)  Housing: Patient Unable To Answer (10/21/2023)  Transportation Needs: Patient Unable To Answer (10/21/2023)  Utilities: Patient Unable To Answer (10/21/2023)  Financial Resource Strain: Medium Risk (04/06/2021)   Received from [facility], [facility]  Physical Activity: Insufficiently Active (02/23/2021)   Received from [facility], [facility]  Social Connections: Patient Unable To Answer (10/21/2023)  Stress: No Stress Concern Present (02/23/2021)   Received from [facility], [facility]  Tobacco Use: Low Risk  (10/18/2023)  Health Literacy: Low Risk  (02/23/2021)   Received from [facility], [facility]    Readmission Risk Interventions    10/22/2023    1:09 PM  Readmission Risk Prevention Plan  Transportation Screening Complete  PCP or Specialist Appt within 5-7 Days Complete  Home Care Screening Complete  Medication Review (RN CM) Complete

## 2023-11-01 NOTE — Progress Notes (Signed)
   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 Garrett Ferguson shared his family history and dynamics. Wife was church musician for many years, and Garrett Ferguson enjoys listening as his autistic son play the organ and piano for him. Garrett Ferguson shared many stories from his life. Per the H.O.P.E spiritual assessment tool, Garrett Ferguson faith is secure and spiritual is settled, as he feels his body is ill but his Spirit is well. "God has been good to " him and he'd love to live to see another Easter Sunday. Garrett Ferguson believes that God spared his life once more and resurrected him after he stopped breathing. He is concerned, however, that he is no longer able to congregate with the [facility], however he does receive lots of prayers and calls from those who remained in touch. He understand that he may need to reside temporarily in an SNF, however, his only concern is that his son is cared for.  Garrett Ferguson says the hospital have been very good to him and he is thankful.  Garrett Ferguson would love another visit.

## 2023-11-01 NOTE — Progress Notes (Signed)
   11/01/23 1106  Spiritual Encounters  Type of Visit Initial  Care provided to: Patient  Conversation partners present during encounter Nurse  Reason for visit Routine spiritual support  OnCall Visit No   Briefly visited with Garrett Ferguson who welcomed and was excited about the visit. Unfortunately visit was shorten/interrupted because patient is being moved to another room, an chaplain estimates visit may result in a lengthy visit. Chaplain feels that once patient is settled in new room a return to original conversation via follow-up visit, may serve the patient better. Advised patient that chaplain will return once he is moved.

## 2023-11-01 NOTE — Progress Notes (Unsigned)
Enrolled for [employer] to mail a ZIO XT long term holter monitor to the patients address on file.   Dr. Ross to read. 

## 2023-11-02 DIAGNOSIS — A419 Sepsis, unspecified organism: Secondary | ICD-10-CM | POA: Diagnosis not present

## 2023-11-02 DIAGNOSIS — R6521 Severe sepsis with septic shock: Secondary | ICD-10-CM | POA: Diagnosis not present

## 2023-11-02 DIAGNOSIS — N3000 Acute cystitis without hematuria: Secondary | ICD-10-CM | POA: Diagnosis not present

## 2023-11-02 LAB — COMPREHENSIVE METABOLIC PANEL
ALT: 41 U/L (ref 0–44)
AST: 29 U/L (ref 15–41)
Albumin: 2.8 g/dL — ABNORMAL LOW (ref 3.5–5.0)
Alkaline Phosphatase: 148 U/L — ABNORMAL HIGH (ref 38–126)
Anion gap: 6 (ref 5–15)
BUN: 21 mg/dL (ref 8–23)
CO2: 26 mmol/L (ref 22–32)
Calcium: 8.3 mg/dL — ABNORMAL LOW (ref 8.9–10.3)
Chloride: 105 mmol/L (ref 98–111)
Creatinine, Ser: 0.48 mg/dL — ABNORMAL LOW (ref 0.61–1.24)
GFR, Estimated: >60 mL/min (ref >60)
Glucose, Bld: 109 mg/dL — ABNORMAL HIGH (ref 70–99)
Potassium: 3.6 mmol/L (ref 3.5–5.1)
Sodium: 137 mmol/L (ref 135–145)
Total Bilirubin: 0.5 mg/dL (ref 0.0–1.2)
Total Protein: 6.4 g/dL — ABNORMAL LOW (ref 6.5–8.1)

## 2023-11-02 LAB — CBC WITH DIFFERENTIAL/PLATELET
Abs Immature Granulocytes: 0.06 10*3/uL (ref 0.00–0.07)
Basophils Absolute: 0.1 10*3/uL (ref 0.0–0.1)
Basophils Relative: 1 %
Eosinophils Absolute: 0.5 10*3/uL (ref 0.0–0.5)
Eosinophils Relative: 6 %
HCT: 35.2 % — ABNORMAL LOW (ref 39.0–52.0)
Hemoglobin: 10.9 g/dL — ABNORMAL LOW (ref 13.0–17.0)
Immature Granulocytes: 1 %
Lymphocytes Relative: 15 %
Lymphs Abs: 1.2 10*3/uL (ref 0.7–4.0)
MCH: 29.7 pg (ref 26.0–34.0)
MCHC: 31 g/dL (ref 30.0–36.0)
MCV: 95.9 fL (ref 80.0–100.0)
Monocytes Absolute: 0.5 10*3/uL (ref 0.1–1.0)
Monocytes Relative: 6 %
Neutro Abs: 5.7 10*3/uL (ref 1.7–7.7)
Neutrophils Relative %: 71 %
Platelets: 587 10*3/uL — ABNORMAL HIGH (ref 150–400)
RBC: 3.67 MIL/uL — ABNORMAL LOW (ref 4.22–5.81)
RDW: 14.7 % (ref 11.5–15.5)
WBC: 8 10*3/uL (ref 4.0–10.5)
nRBC: 0 % (ref 0.0–0.2)

## 2023-11-02 LAB — PHOSPHORUS: Phosphorus: 2.9 mg/dL (ref 2.5–4.6)

## 2023-11-02 LAB — MAGNESIUM: Magnesium: 2 mg/dL (ref 1.7–2.4)

## 2023-11-02 MED ORDER — GERHARDT'S BUTT CREAM
TOPICAL_CREAM | Freq: Two times a day (BID) | CUTANEOUS | Status: DC
Start: 2023-11-02 — End: 2023-11-03
  Administered 2023-11-02: 1 via TOPICAL
  Filled 2023-11-02: qty 60

## 2023-11-02 NOTE — Progress Notes (Signed)
 Mr. Maye had a rough night with pain control mainly with the LLE. Additional sliding scale added for po pain meds by provider. Provided pt with pain meds according to orders in addition with ice pack for LLE. Throughout the night pt's pain reassessment was 5 or greater s/p pain meds. Any touch or manipulation of LLE increased pain substantially. Provider aware

## 2023-11-02 NOTE — Progress Notes (Signed)
 [facility]  PROGRESS NOTE  Garrett Ferguson GEX:[id number] DOB: Dec 21, 1945 DOA: 10/18/2023 PCP: [facility] [place] [facility]   Brief HPI:    78 year old male with history of bladder cancer,HTN,restless leg syndrome, chronic lymphedema of B/L lower extremity, peripheral artery disease, GERD who was brought initially to [facility] after he fell at home.  EMS found him to be seizing.  He was confused and agitated afterwards, hypothermic, hypotensive then became unresponsive.    On presentation, UA was suspicious for UTI.  Sepsis protocol started.  Transferred to [facility] long ICU.  Code called for respiratory arrest that started on vasopressors, intubated.  No loss of pulse or CPR done.  Palliative care consulted for goals of care discussion.  Currently DNR.  Patient transferred to [facility] service on 3/14.  Clinically improving.  PT/OT recommending SNF on discharge.   Important events: 3/6 tx from [facility] ER to [facility], urosepsis, AMS, intubated on arrival  3/7 difficulty placing foley, ended up having trauma and urology was consulted 3/9 Off of pressors. Documented SBT x 9 hours. made DNR w palliative care.  3/10 Fib RVR w SBT; back to full support, NSR w amio gtt. Abx to doxy rocephin  3/11 fever, ongoing pressors. Weaned for a few hours in the late afternoon but was pretty tired.  3/12 one-way extubation, DNR/ DNI Weaned to room air.  3/20 foley discontinued   Appears stable for discharge at this point.  Working on SNF placement.     Assessment/Plan:   Septic shock - resolved  secondary to pneumonia, with possible contribution from left lower extremity cellulitis. Completed 7 days course of doxycycline and ceftriaxone on 3/12.   Urine, sputum, and blood cultures negative   Acute hypoxic respiratory failure -resolved - Suspected to be from community-acquired pneumonia, septic shock.   -Initially intubated, now extubated and on room air.    -Continue pulmonary hygiene, flutter valve, incentive  spirometer, bronchodilators as needed.   Hematuria - Likely from traumatic catheterization.  Foley placed by urology on 3/8.  Recommendation is to continue for at least 7 days before restarting voiding trial.   -Foley catheter was discontinued yesterday.  Patient voiding well    Superficial Vein Thrombosis of L Basilic Vein - no DVT - symptomatic management   Hypertension - BP improved (was as high as the 200's) - unfortunately, restricted bilateral upper extremities, using R calf for pressures - ensure appropriately sized cuff -Continue amlodipine/losartan - continue careful use of prn antihypertensives - goal is gradual reduction in BP   Concern for Seizure Like Activity - at presentation, concern for seizure like activity.  Not formally evaluated by neurology.  ? If related to hypotension/shock. - EEG without seizures or epileptiform discharges - head CT without acute intracranial abnormality - consider outpatient neurology follow up   Dysphagia - Speech therapy following.  Started on regular diet    A-fib with RVR - resolved  - Went into rapid A-fib on 3/10 but spontaneously converted to normal sinus rhythm with amiodarone.   - This was most likely associated with acute respiratory distress.   -Cardiology consulted for 2-week ZIO for A-fib burden assessment as outpatient - Currently on aspirin, statin.  Anticoagulation not felt to be indicated as afib precipitated in setting of acute illness/shock   Normocytic anemia - Currently stable.  Continue to monitor   History of chronic lymphedema Bilateral Lower Extremity Edema - Continue supportive care, wound care was consulted - recent echo with EF 60-65%, no RWMA, moderately reduced  RVSF, severely dilated LA, IVC dilated with <50% resp variability - BNP mildly elevated -Continue Lasix 40 mg daily - strict I/O, daily weights   Frequent falls/physical deconditioning Chronic Lower Extremity Weakness - significant L>R LE  weakness - denies numbness or saddle anesthesia - seems to be longstanding - has history of multiple surgeries to L knee and chronic pain related to that.  Weakness could be related to chronic deconditioning related to pain and lymphedema.   - Mostly wheelchair dependent at home since March 15, 2023.  PT/OT consulted.  SNF recommended   Hyperglycemia - Currently on sliding scale.  A1c of 5.8.  Blood sugar stable now   History of restless leg syndrome/chronic pain syndrome/anxiety/depression - On as needed oxycodone, hydroxyzine   Goals of care - Palliative care following.  DNR/DNI - Goal is to continue current care but family had opted for comfort care if he declined further (at this point, he's stabilized           Medications     amLODipine  5 mg Oral Daily   aspirin  81 mg Oral Daily   atorvastatin  10 mg Oral Daily   bisacodyl  10 mg Oral Daily   Chlorhexidine Gluconate Cloth  6 each Topical Daily   docusate sodium  100 mg Oral BID   feeding supplement  237 mL Oral BID BM   furosemide  40 mg Oral Daily   gabapentin  100 mg Oral BID   Gerhardt's butt cream   Topical BID   heparin  5,000 Units Subcutaneous Q8H   losartan  25 mg Oral Daily   multivitamin with minerals  1 tablet Oral Daily   naphazoline-glycerin  2 drop Left Eye QID   polyethylene glycol  17 g Oral Daily   pramipexole  0.125 mg Oral TID   prednisoLONE acetate  1 drop Right Eye TID     Data Reviewed:   CBG:  Recent Labs  Lab 10/26/23 1157 10/26/23 1553 10/26/23 2108 10/27/23 0753 10/27/23 1151  GLUCAP 103* 109* 110* 101* 113*    SpO2: 99 % O2 Flow Rate (L/min): 2 L/min FiO2 (%): 30 %    Vitals:   11/01/23 1531 11/01/23 2002 11/02/23 0500 11/02/23 0542  BP: (!) 147/57 (!) 125/54  (!) 170/84  Pulse: 64 66  75  Resp:    18  Temp: 98.4 F (36.9 C) 98.2 F (36.8 C)  98 F (36.7 C)  TempSrc: Oral Oral    SpO2: 99% 100%  99%  Weight:   97.5 kg   Height:          Data  Reviewed:  Basic Metabolic Panel: Recent Labs  Lab 10/27/23 0544 11/01/23 0556 11/02/23 0345  NA 138 135 137  K 4.1 3.2* 3.6  CL 101 102 105  CO2 28 26 26   GLUCOSE 96 108* 109*  BUN 28* 20 21  CREATININE 0.62 0.51* 0.48*  CALCIUM 8.6* 8.2* 8.3*  MG  --  2.1 2.0  PHOS  --  3.2 2.9    CBC: Recent Labs  Lab 10/27/23 0544 11/01/23 0556 11/02/23 0345  WBC 9.7 8.3 8.0  NEUTROABS  --  6.4 5.7  HGB 10.4* 10.9* 10.9*  HCT 33.8* 36.0* 35.2*  MCV 95.8 97.8 95.9  PLT 301 573* 587*    LFT Recent Labs  Lab 11/01/23 0556 11/02/23 0345  AST 30 29  ALT 44 41  ALKPHOS 146* 148*  BILITOT 0.7 0.5  PROT 6.2* 6.4*  ALBUMIN 2.7* 2.8*     Antibiotics: Anti-infectives (From admission, onward)    Start     Dose/Rate Route Frequency Ordered Stop   10/22/23 2200  doxycycline (VIBRAMYCIN) 100 mg in sodium chloride 0.9 % 250 mL IVPB        100 mg 125 mL/hr over 120 Minutes Intravenous Every 12 hours 10/22/23 0807 10/25/23 0038   10/22/23 1000  cefTRIAXone (ROCEPHIN) 2 g in sodium chloride 0.9 % 100 mL IVPB        2 g 200 mL/hr over 30 Minutes Intravenous Every 24 hours 10/22/23 0807 10/24/23 1352   10/20/23 2200  vancomycin (VANCOREADY) IVPB 1500 mg/300 mL  Status:  Discontinued        1,500 mg 150 mL/hr over 120 Minutes Intravenous Every 24 hours 10/20/23 0922 10/22/23 0807   10/19/23 2200  vancomycin (VANCOREADY) IVPB 1750 mg/350 mL  Status:  Discontinued        1,750 mg 175 mL/hr over 120 Minutes Intravenous Every 24 hours 10/18/23 2223 10/20/23 0922   10/19/23 1200  metroNIDAZOLE (FLAGYL) IVPB 500 mg  Status:  Discontinued        500 mg 100 mL/hr over 60 Minutes Intravenous Every 12 hours 10/19/23 1042 10/22/23 0807   10/19/23 1130  cefTRIAXone (ROCEPHIN) 2 g in sodium chloride 0.9 % 100 mL IVPB  Status:  Discontinued        2 g 200 mL/hr over 30 Minutes Intravenous Every 24 hours 10/19/23 1042 10/22/23 0807   10/18/23 2230  vancomycin (VANCOREADY) IVPB 2000 mg/400 mL         2,000 mg 200 mL/hr over 120 Minutes Intravenous  Once 10/18/23 2130 10/19/23 0056   10/18/23 2200  ceFEPIme (MAXIPIME) 2 g in sodium chloride 0.9 % 100 mL IVPB  Status:  Discontinued        2 g 200 mL/hr over 30 Minutes Intravenous Every 8 hours 10/18/23 1533 10/19/23 1042   10/18/23 1530  ceFEPIme (MAXIPIME) 2 g in sodium chloride 0.9 % 100 mL IVPB        2 g 200 mL/hr over 30 Minutes Intravenous  Once 10/18/23 1446 10/18/23 1614   10/18/23 1215  cefTRIAXone (ROCEPHIN) 2 g in sodium chloride 0.9 % 100 mL IVPB        2 g 200 mL/hr over 30 Minutes Intravenous Once 10/18/23 1206 10/18/23 1325        DVT prophylaxis: Heparin  Code Status: DNR  Family Communication: No family at bedside   CONSULTS    Subjective   Denies any complaints.  Foley catheter was discontinued.  Voiding okay.   Objective    Physical Examination:   General-appears in no acute distress Heart-S1-S2, regular, no murmur auscultated Lungs-clear to auscultation bilaterally, no wheezing or crackles auscultated Abdomen-soft, nontender, no organomegaly Extremities-no edema in the lower extremities Neuro-alert, oriented x3, no focal deficit noted  Status is: Inpatient:             Meredeth Ide   [facility] If 7PM-7AM, please contact night-coverage at [email or web address], Office  [phone]   11/02/2023, 8:22 AM  LOS: 15 days

## 2023-11-02 NOTE — Progress Notes (Signed)
  Daily Progress Note   Patient Name: Garrett Ferguson       Date: 11/02/2023 DOB: April 25, 1946  Age: 78 y.o. MRN#: [id number] Attending Physician: Meredeth Ide, MD Primary Care Physician: [facility] [place] [facility] Admit Date: 10/18/2023 Length of Stay: 15 days  Reason for Consultation/Follow-up: Establishing goals of care  Subjective:   CC: Patient resting in bed, increased LE pain overnight.   Subjective:  Reviewed EMR prior to seeing patient.   Resting in bed, no distress currently.   Objective:   Vital Signs:  BP (!) 161/76 (BP Location: Left Arm)   Pulse 71   Temp 98 F (36.7 C)   Resp 18   Ht 5\' 11"  (1.803 m)   Wt 97.5 kg   SpO2 99%   BMI 29.98 kg/m   Physical Exam: General: Asleep laying in bed, chronically ill-appearing  (hard of hearing as well)   Respiratory: no increased work of breathing noted Skin: Chronic venous stasis changes in lower extremities bilaterally  Imaging: I personally reviewed recent imaging.   Assessment & Plan:   Assessment: Patient is an 78 year old male with a past medical history of bladder cancer, hypertension, RLS, chronic pain, chronic lymphedema in lower extremities bilaterally, PAD, CKD, and GERD who was transferred from [facility] on 10/18/2023 after a fall at home when was noted had seizure.  Since admission, patient has received management for acute metabolic encephalopathy, acute hypoxic respiratory failure requiring intubation with mechanical ventilation support secondary to pneumonia, and acute onset A-fib with RVR.  Palliative medicine team consulted to assist with complex medical decision making.  Recommendations/Plan: # Complex medical decision making/goals of care:  - PT note reviewed, TOC note reviewed, likely for SNF.   -  Code Status: Limited: Do not attempt resuscitation (DNR) -DNR-LIMITED -Do Not Intubate/DNI  Pain management: Agree with Tylenol PO PRN, also on Adjuvant Gabapentin, also on opioids Oxy IR PO PRN. Continue the  same.  # Psychosocial Support:  -Wife, son  # Discharge Planning: SNF rehab with palliative.   Discussed with: Patient Patient states that he wants to be considered for [facility] center in [place], [place] so as to be closer to his family. Thank you for allowing the palliative care team to participate in the care Bari Mantis.  Low MDM Rosalin Hawking MD.  Palliative Care Provider PMT # [phone]  If patient remains symptomatic despite maximum doses, please call PMT at [phone] between 0700 and 1900. Outside of these hours, please call attending, as PMT does not have night coverage.

## 2023-11-02 NOTE — TOC Progression Note (Addendum)
 Transition of Care [facility]) - Progression Note    Patient Details  Name: Garrett Ferguson MRN: [id number] Date of Birth: Dec 30, 1945  [facility]) CM/SW Contact  Larrie Kass, LCSW Phone Number: 11/02/2023, 9:29 AM  Clinical Narrative:     Insurance Berkley Harvey is pending. TOC to follow.   ADDEN 2:45pm CSW received a call from [facility] pt's insurance auth for SNF placement went to Peer to Peer with Dr Lovena Le [phone], has to be done by 12pm tomorrow. CSW made MD aware. TOC to follow.   Expected Discharge Plan: Skilled Nursing Facility Barriers to Discharge: English as a second language teacher, SNF Pending bed offer  Expected Discharge Plan and Services In-house Referral: NA Discharge Planning Services: NA   Living arrangements for the past 2 months: Single Family Home                   DME Agency: NA       HH Arranged: NA           Social Determinants of Health (SDOH) Interventions SDOH Screenings   Food Insecurity: Patient Unable To Answer (10/21/2023)  Housing: Patient Unable To Answer (10/21/2023)  Transportation Needs: Patient Unable To Answer (10/21/2023)  Utilities: Patient Unable To Answer (10/21/2023)  Financial Resource Strain: Medium Risk (04/06/2021)   Received from [facility], [facility]  Physical Activity: Insufficiently Active (02/23/2021)   Received from [facility], [facility]  Social Connections: Patient Unable To Answer (10/21/2023)  Stress: No Stress Concern Present (02/23/2021)   Received from [facility], [facility]  Tobacco Use: Low Risk  (10/18/2023)  Health Literacy: Low Risk  (02/23/2021)   Received from [facility], [facility]    Readmission Risk Interventions    10/22/2023    1:09 PM  Readmission Risk Prevention Plan  Transportation Screening Complete  PCP or Specialist Appt within 5-7 Days Complete  Home Care Screening Complete  Medication Review (RN CM) Complete

## 2023-11-03 DIAGNOSIS — A419 Sepsis, unspecified organism: Secondary | ICD-10-CM | POA: Diagnosis not present

## 2023-11-03 DIAGNOSIS — J189 Pneumonia, unspecified organism: Secondary | ICD-10-CM | POA: Diagnosis not present

## 2023-11-03 DIAGNOSIS — N3000 Acute cystitis without hematuria: Secondary | ICD-10-CM | POA: Diagnosis not present

## 2023-11-03 DIAGNOSIS — I890 Lymphedema, not elsewhere classified: Secondary | ICD-10-CM

## 2023-11-03 DIAGNOSIS — J9601 Acute respiratory failure with hypoxia: Secondary | ICD-10-CM | POA: Diagnosis not present

## 2023-11-03 MED ORDER — DOCUSATE SODIUM 100 MG PO CAPS: 100.0000 mg | ORAL_CAPSULE | Freq: Two times a day (BID) | ORAL | Status: DC

## 2023-11-03 MED ORDER — BISACODYL 5 MG PO TBEC: 10.0000 mg | DELAYED_RELEASE_TABLET | Freq: Every day | ORAL | Status: DC | PRN

## 2023-11-03 MED ORDER — ENSURE ENLIVE PO LIQD
237.0000 mL | Freq: Two times a day (BID) | ORAL | Status: AC
Start: 2023-11-03 — End: ?

## 2023-11-03 MED ORDER — LOSARTAN POTASSIUM 25 MG PO TABS: 25.0000 mg | ORAL_TABLET | Freq: Every day | ORAL | Status: AC

## 2023-11-03 MED ORDER — GABAPENTIN 100 MG PO CAPS: 100.0000 mg | ORAL_CAPSULE | Freq: Two times a day (BID) | ORAL | Status: AC

## 2023-11-03 MED ORDER — AMLODIPINE BESYLATE 5 MG PO TABS: 5.0000 mg | ORAL_TABLET | Freq: Every day | ORAL | Status: AC

## 2023-11-03 MED ORDER — ACETAMINOPHEN 325 MG PO TABS: 650.0000 mg | ORAL_TABLET | Freq: Four times a day (QID) | ORAL | Status: AC | PRN

## 2023-11-03 MED ORDER — POLYETHYLENE GLYCOL 3350 17 G PO PACK: 17.0000 g | PACK | Freq: Every day | ORAL | Status: DC | PRN

## 2023-11-03 MED ORDER — IPRATROPIUM BROMIDE 0.02 % IN SOLN: 0.5000 mg | Freq: Four times a day (QID) | RESPIRATORY_TRACT | 12 refills | Status: DC | PRN

## 2023-11-03 MED ORDER — FUROSEMIDE 40 MG PO TABS: 40.0000 mg | ORAL_TABLET | Freq: Every day | ORAL | Status: DC

## 2023-11-03 MED ORDER — OXYCODONE HCL 5 MG PO TABS: 5.0000 mg | ORAL_TABLET | Freq: Four times a day (QID) | ORAL | 0 refills | Status: AC | PRN

## 2023-11-03 NOTE — TOC Progression Note (Signed)
 Transition of Care [facility]) - Progression Note    Patient Details  Name: Garrett Ferguson MRN: [id number] Date of Birth: 05-04-1946  Transition of Care [facility]) CM/SW Contact  Adrian Prows, RN Phone Number: 11/03/2023, 11:49 AM  Clinical Narrative:    Received call from Junious Dresser at [facility]; she says pt has been approved for SNF at [facility], auth # [id number]; she says pt has 5 business days to transfer to facility, and this initial offer is for 7 days; Dr Sharl Ma and Donato Schultz, LCSW notified via secure chat.   Expected Discharge Plan: Skilled Nursing Facility Barriers to Discharge: English as a second language teacher, SNF Pending bed offer  Expected Discharge Plan and Services In-house Referral: NA Discharge Planning Services: NA   Living arrangements for the past 2 months: Single Family Home                   DME Agency: NA       HH Arranged: NA           Social Determinants of Health (SDOH) Interventions SDOH Screenings   Food Insecurity: Patient Unable To Answer (10/21/2023)  Housing: Patient Unable To Answer (10/21/2023)  Transportation Needs: Patient Unable To Answer (10/21/2023)  Utilities: Patient Unable To Answer (10/21/2023)  Financial Resource Strain: Medium Risk (04/06/2021)   Received from [facility], [facility]  Physical Activity: Insufficiently Active (02/23/2021)   Received from [facility], [facility]  Social Connections: Patient Unable To Answer (10/21/2023)  Stress: No Stress Concern Present (02/23/2021)   Received from [facility], [facility]  Tobacco Use: Low Risk  (10/18/2023)  Health Literacy: Low Risk  (02/23/2021)   Received from [facility], [facility]    Readmission Risk Interventions    10/22/2023    1:09 PM  Readmission Risk Prevention Plan  Transportation Screening Complete  PCP or Specialist Appt within 5-7 Days Complete  Home Care Screening Complete  Medication Review (RN CM) Complete

## 2023-11-03 NOTE — Progress Notes (Signed)
 AVS given to patient and explained at the bedside and over the phone with the receiving nurse at [facility]. Medications and follow up appointments have been explained with pt and the pt's receiving nurse verbalizing understanding.

## 2023-11-03 NOTE — Discharge Summary (Signed)
 Physician Discharge Summary   Patient: Garrett Ferguson MRN: [id number] DOB: September 17, 1945  Admit date:     10/18/2023  Discharge date: 11/03/23  Discharge Physician: Meredeth Ide   PCP: [facility], [facility]   Recommendations at discharge:   Patient to be discharged to skilled nursing facility for rehab  Discharge Diagnoses: Active Problems:   Physical deconditioning   Lymphedema   Hypertension   History of bladder cancer   Hypoalbuminemia   Palliative care encounter   Goals of care, counseling/discussion   Need for emotional support   Counseling and coordination of care  Principal Problem (Resolved):   Septic shock (HCC) Resolved Problems:   UTI (urinary tract infection)   Acute hypoxic respiratory failure (HCC)   CAP (community acquired pneumonia)   Paroxysmal atrial fibrillation with RVR [facility])  Hospital Course: Garrett Ferguson is a 78 year old male with history of bladder cancer,HTN,restless leg syndrome, chronic lymphedema of B/L lower extremity, peripheral artery disease, GERD who was brought initially to [facility] after he fell at home.  EMS found him to be seizing.  He was confused and agitated afterwards, hypothermic, hypotensive then became unresponsive.   On presentation, UA was suspicious for UTI.  Sepsis protocol started.  Transferred to [facility] long ICU.  Code called for respiratory arrest that started on vasopressors, intubated.  No loss of pulse or CPR done.  Palliative care consulted for goals of care discussion.  Currently DNR.  Patient transferred to [facility] service on 3/14.  Clinically improving.  PT/OT recommending SNF on discharge.   Important events: 3/6 tx from [facility] ER to [facility], urosepsis, AMS, intubated on arrival  3/7 difficulty placing foley, ended up having trauma and urology was consulted 3/9 Off of pressors. Documented SBT x 9 hours. made DNR w palliative care.  3/10 Fib RVR w SBT; back to full support, NSR w amio gtt. Abx to doxy rocephin  3/11 fever,  ongoing pressors. Weaned for a few hours in the late afternoon but was pretty tired.  3/12 one-way extubation, DNR/ DNI Weaned to room air 3/20 Foley catheter discontinued 3/22, patient to be discharged to skilled nursing facility for rehab   A&P:  Septic shock - resolved  secondary to pneumonia, with possible contribution from left lower extremity cellulitis. Completed 7 days course of doxycycline and ceftriaxone on 3/12.   Urine, sputum, and blood cultures negative   Acute hypoxic respiratory failure -resolved - Suspected to be from community-acquired pneumonia, septic shock.   -Initially intubated, now extubated and on room air.    -Continue  bronchodilators as needed.   Hematuria - Likely from traumatic catheterization.  Foley placed by urology on 3/8.  Recommendation is to continue for at least 7 days before restarting voiding trial.   -Foley catheter was discontinued yesterday.  Patient voiding well -Continue tamsulosin 0.4 mg daily    Superficial Vein Thrombosis of L Basilic Vein - no DVT - symptomatic management   Hypertension - BP improved (was as high as the 200's) - unfortunately, restricted bilateral upper extremities, using R calf for pressures - ensure appropriately sized cuff -Continue amlodipine/losartan - continue careful use of prn antihypertensives  - goal is gradual reduction in BP   Concern for Seizure Like Activity - at presentation, concern for seizure like activity.  Not formally evaluated by neurology.  ? If related to hypotension/shock. - EEG without seizures or epileptiform discharges - head CT without acute intracranial abnormality    Dysphagia - Speech therapy following.  Started on  regular diet    A-fib with RVR - resolved  - Went into rapid A-fib on 3/10 but spontaneously converted to normal sinus rhythm with amiodarone.   - This was most likely associated with acute respiratory distress.   -Cardiology consulted for 2-week ZIO for A-fib  burden assessment as outpatient - Currently on aspirin, statin.  Anticoagulation not felt to be indicated as afib precipitated in setting of acute illness/shock   Normocytic anemia - Currently stable.  Continue to monitor   History of chronic lymphedema Bilateral Lower Extremity Edema - Continue supportive care, wound care was consulted - recent echo with EF 60-65%, no RWMA, moderately reduced RVSF, severely dilated LA, IVC dilated with <50% resp variability - BNP mildly elevated -Continue Lasix 40 mg daily    Frequent falls/physical deconditioning Chronic Lower Extremity Weakness - significant L>R LE weakness - denies numbness or saddle anesthesia - seems to be longstanding - has history of multiple surgeries to L knee and chronic pain related to that.  Weakness could be related to chronic deconditioning related to pain and lymphedema.   - Mostly wheelchair dependent at home since March 15, 2023.  PT/OT consulted.  SNF recommended   Hyperglycemia - Currently on sliding scale.  A1c of 5.8.  Blood sugar stable now   History of restless leg syndrome/chronic pain syndrome/anxiety/depression - On as needed oxycodone, hydroxyzine   Goals of care - Palliative care following.  DNR/DNI - Goal is to continue current care but family had opted for comfort care if he declined further (at this point, he's stabilized         Consultants: CCM, palliative care Procedures performed: Echocardiogram Disposition: Skilled nursing facility Diet recommendation:  Discharge Diet Orders (From admission, onward)     Start     Ordered   11/03/23 0000  Diet - low sodium heart healthy        11/03/23 1210           Regular diet DISCHARGE MEDICATION: Allergies as of 11/03/2023       Reactions   Sulfamethoxazole-trimethoprim    Diclofenac Sodium Rash   Latex Rash   Levofloxacin Rash   Pt developed red spots on leg and itching 2-3 hours after taking po Levaquin   Penicillins Rash         Medication List     STOP taking these medications    cefdinir 300 MG capsule Commonly known as: OMNICEF   lisinopril 20 MG tablet Commonly known as: ZESTRIL   metoprolol succinate 50 MG 24 hr tablet Commonly known as: TOPROL-XL   oxyCODONE ER 9 MG C12a   zinc gluconate 50 MG tablet       TAKE these medications    acetaminophen 325 MG tablet Commonly known as: TYLENOL Take 2 tablets (650 mg total) by mouth every 6 (six) hours as needed for mild pain (pain score 1-3) or fever.   amLODipine 5 MG tablet Commonly known as: NORVASC Take 1 tablet (5 mg total) by mouth daily. Start taking on: November 04, 2023   aspirin EC 81 MG tablet Take 81 mg by mouth daily.   atorvastatin 10 MG tablet Commonly known as: LIPITOR Take 10 mg by mouth daily.   benzonatate 100 MG capsule Commonly known as: TESSALON Take 1 capsule (100 mg total) by mouth every 8 (eight) hours.   bisacodyl 5 MG EC tablet Commonly known as: DULCOLAX Take 2 tablets (10 mg total) by mouth daily as needed for moderate constipation.  buPROPion 150 MG 24 hr tablet Commonly known as: WELLBUTRIN XL Take 150 mg by mouth daily.   cholecalciferol 25 MCG (1000 UNIT) tablet Commonly known as: VITAMIN D3 Take 1,000 Units by mouth daily.   docusate sodium 100 MG capsule Commonly known as: COLACE Take 1 capsule (100 mg total) by mouth 2 (two) times daily.   feeding supplement Liqd Take 237 mLs by mouth 2 (two) times daily between meals.   furosemide 40 MG tablet Commonly known as: LASIX Take 1 tablet (40 mg total) by mouth daily. Start taking on: November 04, 2023 What changed:  medication strength how much to take when to take this   gabapentin 100 MG capsule Commonly known as: NEURONTIN Take 1 capsule (100 mg total) by mouth 2 (two) times daily. What changed:  medication strength See the new instructions.   ipratropium 0.02 % nebulizer solution Commonly known as: ATROVENT Take 2.5 mLs (0.5 mg  total) by nebulization every 6 (six) hours as needed for wheezing.   losartan 25 MG tablet Commonly known as: COZAAR Take 1 tablet (25 mg total) by mouth daily. Start taking on: November 04, 2023   multivitamin with minerals Tabs tablet Take 1 tablet by mouth daily.   naphazoline-glycerin 0.012-0.25 % Soln Commonly known as: CLEAR EYES REDNESS Place 2 drops into the left eye 4 (four) times daily as needed for eye irritation.   oxyCODONE 5 MG immediate release tablet Commonly known as: Oxy IR/ROXICODONE Take 1-2 tablets (5-10 mg total) by mouth every 6 (six) hours as needed for moderate pain (pain score 4-6) or severe pain (pain score 7-10). What changed:  how much to take when to take this reasons to take this   polyethylene glycol 17 g packet Commonly known as: MIRALAX / GLYCOLAX Take 17 g by mouth daily as needed.   pramipexole 1 MG tablet Commonly known as: MIRAPEX TAKE 1 TABLET BY MOUTH TWICE DURING THE DAY AND ONE AND ONE-HALF TABLETS AT NIGHT   prednisoLONE Acetate P-F 1 % ophthalmic suspension Generic drug: prednisoLONE acetate Place 1 drop into the right eye 3 (three) times daily.   sertraline 100 MG tablet Commonly known as: ZOLOFT Take 200 mg by mouth daily.   tamsulosin 0.4 MG Caps capsule Commonly known as: FLOMAX Take 0.4 mg by mouth daily.               Discharge Care Instructions  (From admission, onward)           Start     Ordered   11/03/23 0000  Discharge wound care:       Comments: Cleanse L lower leg wound with Vashe wound cleanser [facility] [place] [phone]), apply Xeroform gauze to wound bed daily, cover with ABD pad and secure with Kerlix roll gauze applied beginning just above toes and ending right below knee.  May apply Ace bandage wrapped in same fashion as Kerlix for light compressio   11/03/23 1210            Contact information for after-discharge care     Destination     [facility] .   Service: Skilled  Nursing Contact information: [address] [place] [address] [phone]                    Discharge Exam: Filed Weights   11/01/23 0500 11/02/23 0500 11/03/23 0420  Weight: 97 kg 97.5 kg 94.3 kg   General-appears in no acute distress Heart-S1-S2, regular, no  murmur auscultated Lungs-clear to auscultation bilaterally, no wheezing or crackles auscultated Neuro-alert, oriented x3, no focal deficit noted  Condition at discharge: good  The results of significant diagnostics from this hospitalization (including imaging, microbiology, ancillary and laboratory) are listed below for reference.   Imaging Studies: VAS [place] UPPER EXTREMITY VENOUS DUPLEX Result Date: 10/30/2023 UPPER VENOUS STUDY  Patient Name:  KEVRON PATELLA  Date of Exam:   10/30/2023 Medical Rec #: [id number]        Accession #:    [id number] Date of Birth: 07/29/1946        Patient Gender: M Patient Age:   78 years Exam Location:  [facility] Procedure:      VAS [place] UPPER EXTREMITY VENOUS DUPLEX Referring Phys: Lewie Chamber --------------------------------------------------------------------------------  Indications: concern for DVT/SVT Risk Factors: None identified. Comparison Study: No prior studies. Performing Technologist: Chanda Busing RVT  Examination Guidelines: A complete evaluation includes B-mode imaging, spectral Doppler, color Doppler, and power Doppler as needed of all accessible portions of each vessel. Bilateral testing is considered an integral part of a complete examination. Limited examinations for reoccurring indications may be performed as noted.  Right Findings: +----------+------------+---------+-----------+----------+-------+ RIGHT     CompressiblePhasicitySpontaneousPropertiesSummary +----------+------------+---------+-----------+----------+-------+ Subclavian    Full       Yes       Yes                       +----------+------------+---------+-----------+----------+-------+  Left Findings: +----------+------------+---------+-----------+----------+-------+ LEFT      CompressiblePhasicitySpontaneousPropertiesSummary +----------+------------+---------+-----------+----------+-------+ IJV           Full       Yes       Yes                      +----------+------------+---------+-----------+----------+-------+ Subclavian    Full       Yes       Yes                      +----------+------------+---------+-----------+----------+-------+ Axillary      Full       Yes       Yes                      +----------+------------+---------+-----------+----------+-------+ Brachial      Full                                          +----------+------------+---------+-----------+----------+-------+ Radial        Full                                          +----------+------------+---------+-----------+----------+-------+ Ulnar         Full                                          +----------+------------+---------+-----------+----------+-------+ Cephalic      Full                                          +----------+------------+---------+-----------+----------+-------+ Basilic  None                                   Acute  +----------+------------+---------+-----------+----------+-------+ Thrombus detected in the basilic vein is noted to extend from the [facility] into the proximal forearm.  Summary:  Right: No evidence of thrombosis in the subclavian.  Left: No evidence of deep vein thrombosis in the upper extremity. Findings consistent with acute superficial vein thrombosis involving the left basilic vein.  *See table(s) above for measurements and observations.  Diagnosing physician: Lemar Livings MD Electronically signed by Lemar Livings MD on 10/30/2023 at 3:44:29 PM.    Final    DG CHEST PORT 1 VIEW Result Date: 10/27/2023 CLINICAL DATA:  Cough. EXAM: PORTABLE CHEST 1 VIEW  COMPARISON:  10/19/2023 and older studies. FINDINGS: Since most recent prior exam, the endotracheal tube and nasal/orogastric tube have been removed. The right PICC is stable. Cardiac silhouette is normal size.  No mediastinal or hilar masses. Opacity noted at the medial right lung base. There is hazy opacity in the central to inferior right upper lobe and to a lesser degree, left upper lobe. No convincing pleural effusion and no pneumothorax. IMPRESSION: 1. Interval removal of the endotracheal tube and nasal/orogastric tube. 2. Right lower lobe opacity suspected to be atelectasis. 3. Hazy airspace opacity in the upper lobes, right greater than left, suspicious for pneumonia. Electronically Signed   By: Amie Portland M.D.   On: 10/27/2023 10:19   DG Abd 1 View Result Date: 10/22/2023 CLINICAL DATA:  Feeding tube placement EXAM: ABDOMEN - 1 VIEW COMPARISON:  10/21/2023 FINDINGS: Enteric tube tip appears folded back upon itself in the region of the stomach, there may be mild kink of the tubing. Air distension of the bowel is noted IMPRESSION: Enteric tube tip appears folded back upon itself in the region of the stomach, there may be mild kink of the tubing. Electronically Signed   By: Jasmine Pang M.D.   On: 10/22/2023 18:45   DG Abd 1 View Result Date: 10/21/2023 CLINICAL DATA:  [id number] Encounter for imaging study to confirm nasogastric (NG) tube placement [id number] EXAM: ABDOMEN - 1 VIEW COMPARISON:  10/19/2023 abdominal radiograph FINDINGS: Endotracheal tube tip is 3.0 cm above the carina. Right PICC terminates over the cavoatrial junction. Enteric tube terminates in body of the stomach. No evidence of pneumatosis or pneumoperitoneum in the visualized upper abdomen. Stable mild cardiomegaly and mild pulmonary edema. IMPRESSION: Enteric tube terminates in the body of the stomach. Electronically Signed   By: Delbert Phenix M.D.   On: 10/21/2023 15:34   DG Abd 1 View Result Date: 10/19/2023 CLINICAL DATA:  OG tube  placement EXAM: ABDOMEN - 1 VIEW COMPARISON:  Chest x-ray 10/19/2023 and older FINDINGS: Film is rotated to left. The right inferior costophrenic angle is clipped off the edge of the film. Stable ET tube and right-sided PICC. Enteric tube is seen with tip extending caudal to the imaging field and right of the spine. This could be duodenal in location. Enlarged cardiopericardial silhouette. Calcified aorta. Vascular congestion with trace edema. More confluent left retrocardiac opacity seen with a possible left effusion. No pneumothorax. IMPRESSION: Enteric tube in place with tip extending off the caudal edge of the film and into the right hemiabdomen but this could be duodenal in location. Advanced from prior abdominal x-ray. ET tube and right PICC in place. Stable enlarged heart with vascular congestion, possible edema and lung  base opacities. Rotated radiograph. Electronically Signed   By: Karen Kays M.D.   On: 10/19/2023 10:53   ECHOCARDIOGRAM COMPLETE Result Date: 10/19/2023    ECHOCARDIOGRAM REPORT   Patient Name:   JAYTHAN HINELY Date of Exam: 10/19/2023 Medical Rec #:  [id number]       Height:       71.0 in Accession #:    [id number]      Weight:       232.1 lb Date of Birth:  06-Feb-1946       BSA:          2.246 m Patient Age:    77 years        BP:           118/49 mmHg Patient Gender: M               HR:           7 bpm. Exam Location:  Inpatient Procedure: 2D Echo, Cardiac Doppler, Color Doppler and Intracardiac            Opacification Agent (Both Spectral and Color Flow Doppler were            utilized during procedure). Indications:    sHOCK  History:        Patient has no prior history of Echocardiogram examinations.                 PAD; Risk Factors:Non-Smoker and Hypertension.  Sonographer:    Dondra Prader RVT RCS Referring Phys: [phone] PAULA B SIMPSON IMPRESSIONS  1. Abnormal septal motion. Left ventricular ejection fraction, by estimation, is 60 to 65%. The left ventricle has normal function. The  left ventricle has no regional wall motion abnormalities. Left ventricular diastolic parameters were normal.  2. Right ventricular systolic function is moderately reduced. The right ventricular size is moderately enlarged.  3. Left atrial size was severely dilated.  4. The mitral valve is abnormal. Trivial mitral valve regurgitation. No evidence of mitral stenosis.  5. Tricuspid valve regurgitation is mild to moderate.  6. The aortic valve is tricuspid. There is moderate calcification of the aortic valve. There is moderate thickening of the aortic valve. Aortic valve regurgitation is not visualized. Aortic valve sclerosis is present, with no evidence of aortic valve stenosis.  7. The inferior vena cava is dilated in size with <50% respiratory variability, suggesting right atrial pressure of 15 mmHg. FINDINGS  Left Ventricle: Abnormal septal motion. Left ventricular ejection fraction, by estimation, is 60 to 65%. The left ventricle has normal function. The left ventricle has no regional wall motion abnormalities. Definity contrast agent was given IV to delineate the left ventricular endocardial borders. Strain was performed and the global longitudinal strain is indeterminate. The left ventricular internal cavity size was normal in size. There is no left ventricular hypertrophy. Left ventricular diastolic parameters were normal. Right Ventricle: The right ventricular size is moderately enlarged. No increase in right ventricular wall thickness. Right ventricular systolic function is moderately reduced. Left Atrium: Left atrial size was severely dilated. Right Atrium: Right atrial size was normal in size. Pericardium: There is no evidence of pericardial effusion. Mitral Valve: The mitral valve is abnormal. There is mild thickening of the mitral valve leaflet(s). Trivial mitral valve regurgitation. No evidence of mitral valve stenosis. Tricuspid Valve: The tricuspid valve is normal in structure. Tricuspid valve  regurgitation is mild to moderate. No evidence of tricuspid stenosis. Aortic Valve: The aortic valve is tricuspid. There is moderate  calcification of the aortic valve. There is moderate thickening of the aortic valve. Aortic valve regurgitation is not visualized. Aortic valve sclerosis is present, with no evidence of aortic valve stenosis. Aortic valve mean gradient measures 4.0 mmHg. Aortic valve peak gradient measures 6.8 mmHg. Aortic valve area, by VTI measures 1.83 cm. Pulmonic Valve: The pulmonic valve was normal in structure. Pulmonic valve regurgitation is not visualized. No evidence of pulmonic stenosis. Aorta: The aortic root is normal in size and structure. Venous: The inferior vena cava is dilated in size with less than 50% respiratory variability, suggesting right atrial pressure of 15 mmHg. IAS/Shunts: No atrial level shunt detected by color flow Doppler. Additional Comments: 3D was performed not requiring image post processing on an independent workstation and was indeterminate.  LEFT VENTRICLE PLAX 2D LVIDd:         5.20 cm   Diastology LVIDs:         3.80 cm   LV e' medial:    4.35 cm/s LV PW:         1.20 cm   LV E/e' medial:  16.3 LV IVS:        1.10 cm   LV e' lateral:   5.93 cm/s LVOT diam:     1.90 cm   LV E/e' lateral: 12.0 LV SV:         56 LV SV Index:   25 LVOT Area:     2.84 cm  RIGHT VENTRICLE            IVC RV Basal diam:  4.60 cm    IVC diam: 3.50 cm RV S prime:     8.93 cm/s TAPSE (M-mode): 2.3 cm LEFT ATRIUM             Index        RIGHT ATRIUM           Index LA diam:        5.20 cm 2.31 cm/m   RA Area:     18.30 cm LA Vol (A2C):   66.8 ml 29.74 ml/m  RA Volume:   50.90 ml  22.66 ml/m LA Vol (A4C):   73.7 ml 32.81 ml/m LA Biplane Vol: 70.6 ml 31.43 ml/m  AORTIC VALVE                    PULMONIC VALVE AV Area (Vmax):    1.83 cm     PV Vmax:       0.77 m/s AV Area (Vmean):   1.78 cm     PV Peak grad:  2.4 mmHg AV Area (VTI):     1.83 cm AV Vmax:           130.00 cm/s AV  Vmean:          87.500 cm/s AV VTI:            0.304 m AV Peak Grad:      6.8 mmHg AV Mean Grad:      4.0 mmHg LVOT Vmax:         84.00 cm/s LVOT Vmean:        54.800 cm/s LVOT VTI:          0.196 m LVOT/AV VTI ratio: 0.64  AORTA Ao Root diam: 3.30 cm MITRAL VALVE               TRICUSPID VALVE MV Area (PHT): 3.99 cm    TR Peak grad:   39.9 mmHg MV  Decel Time: 190 msec    TR Vmax:        316.00 cm/s MV E velocity: 71.10 cm/s MV A velocity: 66.80 cm/s  SHUNTS MV E/A ratio:  1.06        Systemic VTI:  0.20 m                            Systemic Diam: 1.90 cm Charlton Haws MD Electronically signed by Charlton Haws MD Signature Date/Time: 10/19/2023/10:01:33 AM    Final    DG CHEST PORT 1 VIEW Result Date: 10/19/2023 CLINICAL DATA:  78 year old male respiratory failure. EXAM: PORTABLE CHEST 1 VIEW COMPARISON:  Portable chest yesterday. FINDINGS: Portable AP semi upright view at 0418 hours. Endotracheal tube tip at the level the clavicles. Enteric tube courses to the abdomen, tip not included. Stable right PICC line. Similar patient rotation to the left. Cardiomegaly. Left lung base hypo ventilation with obscured left hemidiaphragm. No pneumothorax. Mild veiling right lung base opacity is increased. Elsewhere ventilation is stable. Pulmonary vascularity not significantly changed. Stable visualized osseous structures. IMPRESSION: 1. Stable cardiomegaly with left greater than right lung base opacity, could be a combination of small pleural effusions, atelectasis, infection. 2. Pulmonary vascularity not significantly changed. Visible line and tube position not significantly changed. Electronically Signed   By: Odessa Fleming M.D.   On: 10/19/2023 07:00   DG Abd 1 View Result Date: 10/19/2023 CLINICAL DATA:  78 year old male enteric tube position. EXAM: ABDOMEN - 1 VIEW COMPARISON:  Portable chest yesterday, 1954 hours. FINDINGS: AP view at 0415 hours. Enteric tube placed into the stomach, side hole at the level of the mid gastric  body. Nonobstructed bowel-gas pattern with combined gas and retained stool in the visible abdomen. No acute osseous abnormality identified. IMPRESSION: Satisfactory enteric tube placement into the stomach. Electronically Signed   By: Odessa Fleming M.D.   On: 10/19/2023 06:59   DG CHEST PORT 1 VIEW Result Date: 10/18/2023 CLINICAL DATA:  Intubated EXAM: PORTABLE CHEST 1 VIEW COMPARISON:  10/18/2023, 03/21/2023 FINDINGS: Interval intubation, tip of the endotracheal tube is about 3.6 cm superior to carina. Right upper extremity central venous catheter tip over the cavoatrial region. Cardiomegaly with worsened vascular congestion and diffu increased perihilar interstitial and ground-glass opacity probably edema. No pleural effusion or pneumothorax IMPRESSION: 1. Interval intubation, tip of endotracheal tube is about 3.6 cm superior to carina 2. Cardiomegaly with worsened vascular congestion and bilateral interstitial and ground-glass opacities suspicious for edema Electronically Signed   By: Jasmine Pang M.D.   On: 10/18/2023 22:05   DG Chest Portable 1 View Result Date: 10/18/2023 CLINICAL DATA:  PICC line placement EXAM: PORTABLE CHEST 1 VIEW COMPARISON:  10/18/2023, 03/21/2023 FINDINGS: Cardiomegaly with vascular congestion and possible mild edema versus bronchitic change. Right upper extremity central venous catheter tip difficult to visualize secondary to patient positioning and superimposition artifact, suspect tip is in the region of the cavoatrial junction. IMPRESSION: 1. Right upper extremity central venous catheter tip difficult to see for reasons above, suspect tip may be in the region of cavoatrial junction 2. Cardiomegaly with vascular congestion. Diffuse interstitial opacity which may be due to mild edema or interstitial inflammatory process Electronically Signed   By: Jasmine Pang M.D.   On: 10/18/2023 18:44   [place] EKG SITE RITE Result Date: 10/18/2023 If Site Rite image not attached, placement could not  be confirmed due to current cardiac rhythm.  CT Head Wo Contrast  Result Date: 10/18/2023 CLINICAL DATA:  Fall, seizure, neuro deficits/altered mental status. EXAM: CT HEAD WITHOUT CONTRAST TECHNIQUE: Contiguous axial images were obtained from the base of the skull through the vertex without intravenous contrast. RADIATION DOSE REDUCTION: This exam was performed according to the departmental dose-optimization program which includes automated exposure control, adjustment of the mA and/or kV according to patient size and/or use of iterative reconstruction technique. COMPARISON:  CT head 10/15/2022 FINDINGS: Brain: No acute intracranial hemorrhage. No CT evidence of acute infarct. Nonspecific hypoattenuation in the periventricular and subcortical white matter favored to reflect chronic microvascular ischemic changes. No edema, mass effect, or midline shift. The basilar cisterns are patent. Ventricles: Prominence of the ventricles suggesting underlying parenchymal volume loss. Vascular: No hyperdense vessel or unexpected calcification. Skull: No acute or aggressive finding. Orbits: Orbits are symmetric. Sinuses: Postsurgical changes of the right maxillary sinus. Mucosal thickening in the ethmoid sinuses. Other: Mastoid air cells are clear. IMPRESSION: 1. No CT evidence of acute intracranial abnormality. 2. Chronic microvascular ischemic changes and parenchymal volume loss. Electronically Signed   By: Emily Filbert M.D.   On: 10/18/2023 14:22   DG Chest Port 1 View Result Date: 10/18/2023 CLINICAL DATA:  Possible sepsis. EXAM: PORTABLE CHEST 1 VIEW COMPARISON:  March 21, 2023. FINDINGS: Stable cardiomegaly with mild central pulmonary vascular congestion. Minimal pulmonary edema may be present. Bony thorax is unremarkable. IMPRESSION: Stable cardiomegaly with mild central pulmonary vascular congestion and possible minimal pulmonary edema. Electronically Signed   By: Lupita Raider M.D.   On: 10/18/2023 14:01     Microbiology: Results for orders placed or performed during the hospital encounter of 10/18/23  Urine Culture (for pregnant, neutropenic or urologic patients or patients with an indwelling urinary catheter)     Status: None   Collection Time: 10/18/23 12:09 PM   Specimen: Urine, Clean Catch  Result Value Ref Range Status   Specimen Description   Final    URINE, CLEAN CATCH Performed at [facility], [address] [place] [address], [place], [place] [address]    Special Requests   Final    NONE Performed at [facility], [address] [place], [place] [address]    Culture   Final    NO GROWTH Performed at [facility], [address] [place], [place] [address]    Report Status 10/20/2023 FINAL  Final  Resp panel by RT-PCR (RSV, Flu A&B, Covid) Anterior Nasal Swab     Status: None   Collection Time: 10/18/23 12:12 PM   Specimen: Anterior Nasal Swab  Result Value Ref Range Status   SARS Coronavirus 2 by RT PCR NEGATIVE NEGATIVE Final    Comment: (NOTE) SARS-CoV-2 target nucleic acids are NOT DETECTED.  The SARS-CoV-2 RNA is generally detectable in upper respiratory specimens during the acute phase of infection. The lowest concentration of SARS-CoV-2 viral copies this assay can detect is 138 copies/mL. A negative result does not preclude SARS-Cov-2 infection and should not be used as the sole basis for treatment or other patient management decisions. A negative result may occur with  improper specimen collection/handling, submission of specimen other than nasopharyngeal swab, presence of viral mutation(s) within the areas targeted by this assay, and inadequate number of viral copies(<138 copies/mL). A negative result must be combined with clinical observations, patient history, and epidemiological information. The expected result is Negative.  Fact Sheet for Patients:  BloggerCourse.com  Fact Sheet for Healthcare Providers:   SeriousBroker.it  This test is no t yet approved or cleared  by the [place] and  has been authorized for detection and/or diagnosis of SARS-CoV-2 by FDA under an Emergency Use Authorization (EUA). This EUA will remain  in effect (meaning this test can be used) for the duration of the COVID-19 declaration under Section 564(b)(1) of the Act, 21 U.S.C.section 360bbb-3(b)(1), unless the authorization is terminated  or revoked sooner.       Influenza A by PCR NEGATIVE NEGATIVE Final   Influenza B by PCR NEGATIVE NEGATIVE Final    Comment: (NOTE) The Xpert Xpress SARS-CoV-2/FLU/RSV plus assay is intended as an aid in the diagnosis of influenza from Nasopharyngeal swab specimens and should not be used as a sole basis for treatment. Nasal washings and aspirates are unacceptable for Xpert Xpress SARS-CoV-2/FLU/RSV testing.  Fact Sheet for Patients: BloggerCourse.com  Fact Sheet for Healthcare Providers: SeriousBroker.it  This test is not yet approved or cleared by the [place] FDA and has been authorized for detection and/or diagnosis of SARS-CoV-2 by FDA under an Emergency Use Authorization (EUA). This EUA will remain in effect (meaning this test can be used) for the duration of the COVID-19 declaration under Section 564(b)(1) of the Act, 21 U.S.C. section 360bbb-3(b)(1), unless the authorization is terminated or revoked.     Resp Syncytial Virus by PCR NEGATIVE NEGATIVE Final    Comment: (NOTE) Fact Sheet for Patients: BloggerCourse.com  Fact Sheet for Healthcare Providers: SeriousBroker.it  This test is not yet approved or cleared by the [place] FDA and has been authorized for detection and/or diagnosis of SARS-CoV-2 by FDA under an Emergency Use Authorization (EUA). This EUA will remain in effect (meaning this test can be used) for  the duration of the COVID-19 declaration under Section 564(b)(1) of the Act, 21 U.S.C. section 360bbb-3(b)(1), unless the authorization is terminated or revoked.  Performed at [facility], [address] [place], [place] [address]   Blood Culture (routine x 2)     Status: None   Collection Time: 10/18/23 12:50 PM   Specimen: BLOOD  Result Value Ref Range Status   Specimen Description BLOOD RFOA  Final   Special Requests   Final    Blood Culture results may not be optimal due to an inadequate volume of blood received in culture bottles BOTTLES DRAWN AEROBIC AND ANAEROBIC   Culture   Final    NO GROWTH 5 DAYS Performed at [facility], [address] [place], [place] [address]    Report Status 10/23/2023 FINAL  Final  Blood Culture (routine x 2)     Status: None   Collection Time: 10/18/23 12:50 PM   Specimen: BLOOD  Result Value Ref Range Status   Specimen Description BLOOD BLOOD RIGHT ARM  Final   Special Requests   Final    BOTTLES DRAWN AEROBIC AND ANAEROBIC Blood Culture adequate volume   Culture   Final    NO GROWTH 5 DAYS Performed at [facility], [address] [place], [place] [address]    Report Status 10/23/2023 FINAL  Final  Culture, blood (Routine X 2) w Reflex to ID Panel     Status: None   Collection Time: 10/18/23  9:01 PM   Specimen: BLOOD  Result Value Ref Range Status   Specimen Description   Final    BLOOD BLOOD RIGHT HAND Performed at [facility], [address], [place], [place] [address]    Special Requests   Final    BOTTLES DRAWN AEROBIC AND ANAEROBIC Blood Culture results may not  be optimal due to an inadequate volume of blood received in culture bottles Performed at [facility], [address] [place], [place] [address]    Culture   Final    NO GROWTH 5 DAYS Performed at [facility], [address] [place], [place] [address]    Report Status 10/23/2023 FINAL  Final  Culture, blood (Routine X 2)  w Reflex to ID Panel     Status: None   Collection Time: 10/18/23  9:01 PM   Specimen: BLOOD  Result Value Ref Range Status   Specimen Description   Final    BLOOD BLOOD RIGHT ARM Performed at [facility], [address], [place], [place] [address]    Special Requests   Final    BOTTLES DRAWN AEROBIC AND ANAEROBIC Blood Culture results may not be optimal due to an inadequate volume of blood received in culture bottles Performed at [facility], [address] [place], [place] [address]    Culture   Final    NO GROWTH 5 DAYS Performed at [facility], [address] [place], [place] [address]    Report Status 10/23/2023 FINAL  Final  Urine Culture (for pregnant, neutropenic or urologic patients or patients with an indwelling urinary catheter)     Status: None   Collection Time: 10/19/23  8:39 AM   Specimen: Urine, Catheterized  Result Value Ref Range Status   Specimen Description   Final    URINE, CATHETERIZED Performed at [facility], [address] [place], [place] [address]    Special Requests   Final    NONE Performed at [facility], [address], [place], [place] [address]    Culture   Final    NO GROWTH Performed at [facility], [address] [place], [place] [address]    Report Status 10/20/2023 FINAL  Final  Culture, Respiratory w Gram Stain     Status: None   Collection Time: 10/19/23  8:39 AM   Specimen: Tracheal Aspirate; Respiratory  Result Value Ref Range Status   Specimen Description   Final    TRACHEAL ASPIRATE Performed at [facility], [address], [place], [place] [address]    Special Requests   Final    NONE Performed at [facility], [address], [place], [place] [address]    Gram Stain   Final    RARE WBC PRESENT,BOTH PMN AND MONONUCLEAR RARE GRAM NEGATIVE RODS    Culture   Final    NO GROWTH 2  DAYS Performed at [facility], [address] [place], [place] [address]    Report Status 10/21/2023 FINAL  Final  MRSA Next Gen by PCR, Nasal     Status: None   Collection Time: 10/19/23  8:39 AM   Specimen: Nasal Mucosa; Nasal Swab  Result Value Ref Range Status   MRSA by PCR Next Gen NOT DETECTED NOT DETECTED Final    Comment: (NOTE) The GeneXpert MRSA Assay (FDA approved for NASAL specimens only), is one component of a comprehensive MRSA colonization surveillance program. It is not intended to diagnose MRSA infection nor to guide or monitor treatment for MRSA infections. Test performance is not FDA approved in patients less than 3 years old. Performed at [facility], [address], [place], [place] [address]     Labs: CBC: Recent Labs  Lab 11/01/23 [phone] 11/02/23  0345  WBC 8.3 8.0  NEUTROABS 6.4 5.7  HGB 10.9* 10.9*  HCT 36.0* 35.2*  MCV 97.8 95.9  PLT 573* 587*   Basic Metabolic Panel: Recent Labs  Lab 11/01/23 0556 11/02/23 0345  NA 135 137  K 3.2* 3.6  CL 102 105  CO2 26 26  GLUCOSE 108* 109*  BUN 20 21  CREATININE 0.51* 0.48*  CALCIUM 8.2* 8.3*  MG 2.1 2.0  PHOS 3.2 2.9   Liver Function Tests: Recent Labs  Lab 11/01/23 0556 11/02/23 0345  AST 30 29  ALT 44 41  ALKPHOS 146* 148*  BILITOT 0.7 0.5  PROT 6.2* 6.4*  ALBUMIN 2.7* 2.8*   CBG: No results for input(s): "GLUCAP" in the last 168 hours.  Discharge time spent: greater than 30 minutes.  Signed: Meredeth Ide, MD [facility] 11/03/2023

## 2023-11-03 NOTE — TOC Transition Note (Signed)
 Transition of Care [facility]) - Discharge Note   Patient Details  Name: Garrett Ferguson MRN: [id number] Date of Birth: 17-Jul-1946  Transition of Care [facility]) CM/SW Contact:  Larrie Kass, LCSW Phone Number: 11/03/2023, 12:11 PM   Clinical Narrative:    Pt to d/c to [facility] , pt's room 106 , RN to call report [phone]. CSW spoke with pt's wife she agrees with plan. PTAR called. No further TOC needs, TOC sign off.    Final next level of care: Skilled Nursing Facility Barriers to Discharge: Barriers Resolved   Patient Goals and CMS Choice Patient states their goals for this hospitalization and ongoing recovery are:: SNF to get stonger   Choice offered to / list presented to : NA [place] ownership interest in [facility].provided to::  (NA)    Discharge Placement                  Name of family member notified: Calder, Oblinger (Spouse)  [phone] (Mobile) Patient and family notified of of transfer: 11/03/23  Discharge Plan and Services Additional resources added to the After Visit Summary for   In-house Referral: NA Discharge Planning Services: NA              DME Agency: NA       HH Arranged: NA          Social Drivers of Health (SDOH) Interventions SDOH Screenings   Food Insecurity: Patient Unable To Answer (10/21/2023)  Housing: Patient Unable To Answer (10/21/2023)  Transportation Needs: Patient Unable To Answer (10/21/2023)  Utilities: Patient Unable To Answer (10/21/2023)  Financial Resource Strain: Medium Risk (04/06/2021)   Received from [facility], [facility]  Physical Activity: Insufficiently Active (02/23/2021)   Received from [facility], [facility]  Social Connections: Patient Unable To Answer (10/21/2023)  Stress: No Stress Concern Present (02/23/2021)   Received from [facility], [facility]  Tobacco Use: Low Risk  (10/18/2023)  Health Literacy: Low Risk  (02/23/2021)   Received from [facility], [facility]     Readmission Risk Interventions    10/22/2023    1:09 PM  Readmission Risk Prevention Plan  Transportation Screening Complete  PCP or Specialist Appt within 5-7 Days Complete  Home Care Screening Complete  Medication Review (RN CM) Complete

## 2023-11-03 NOTE — Progress Notes (Incomplete)
 [facility]  PROGRESS NOTE  Garrett Ferguson:[id number] DOB: March 19, 1946 DOA: 10/18/2023 PCP: [facility]   Brief HPI:    78 year old male with history of bladder cancer,HTN,restless leg syndrome, chronic lymphedema of B/L lower extremity, peripheral artery disease, GERD who was brought initially to [facility] after he fell at home.  EMS found him to be seizing.  He was confused and agitated afterwards, hypothermic, hypotensive then became unresponsive.    On presentation, UA was suspicious for UTI.  Sepsis protocol started.  Transferred to [facility] long ICU.  Code called for respiratory arrest that started on vasopressors, intubated.  No loss of pulse or CPR done.  Palliative care consulted for goals of care discussion.  Currently DNR.  Patient transferred to [facility] service on 3/14.  Clinically improving.  PT/OT recommending SNF on discharge.   Important events: 3/6 tx from [facility] ER to [facility], urosepsis, AMS, intubated on arrival  3/7 difficulty placing foley, ended up having trauma and urology was consulted 3/9 Off of pressors. Documented SBT x 9 hours. made DNR w palliative care.  3/10 Fib RVR w SBT; back to full support, NSR w amio gtt. Abx to doxy rocephin  3/11 fever, ongoing pressors. Weaned for a few hours in the late afternoon but was pretty tired.  3/12 one-way extubation, DNR/ DNI Weaned to room air.  3/20 foley discontinued   Appears stable for discharge at this point.  Working on SNF placement.     Assessment/Plan:   Septic shock - resolved  secondary to pneumonia, with possible contribution from left lower extremity cellulitis. Completed 7 days course of doxycycline and ceftriaxone on 3/12.   Urine, sputum, and blood cultures negative   Acute hypoxic respiratory failure -resolved - Suspected to be from community-acquired pneumonia, septic shock.   -Initially intubated, now extubated and on room air.    -Continue pulmonary hygiene, flutter valve, incentive  spirometer, bronchodilators as needed.   Hematuria - Likely from traumatic catheterization.  Foley placed by urology on 3/8.  Recommendation is to continue for at least 7 days before restarting voiding trial.   -Foley catheter was discontinued yesterday.  Patient voiding well    Superficial Vein Thrombosis of L Basilic Vein - no DVT - symptomatic management   Hypertension - BP improved (was as high as the 200's) - unfortunately, restricted bilateral upper extremities, using R calf for pressures - ensure appropriately sized cuff -Continue amlodipine/losartan - continue careful use of prn antihypertensives - goal is gradual reduction in BP   Concern for Seizure Like Activity - at presentation, concern for seizure like activity.  Not formally evaluated by neurology.  ? If related to hypotension/shock. - EEG without seizures or epileptiform discharges - head CT without acute intracranial abnormality - consider outpatient neurology follow up   Dysphagia - Speech therapy following.  Started on regular diet    A-fib with RVR - resolved  - Went into rapid A-fib on 3/10 but spontaneously converted to normal sinus rhythm with amiodarone.   - This was most likely associated with acute respiratory distress.   -Cardiology consulted for 2-week ZIO for A-fib burden assessment as outpatient - Currently on aspirin, statin.  Anticoagulation not felt to be indicated as afib precipitated in setting of acute illness/shock   Normocytic anemia - Currently stable.  Continue to monitor   History of chronic lymphedema Bilateral Lower Extremity Edema - Continue supportive care, wound care was consulted - recent echo with EF 60-65%, no RWMA, moderately reduced  RVSF, severely dilated LA, IVC dilated with <50% resp variability - BNP mildly elevated -Continue Lasix 40 mg daily - strict I/O, daily weights   Frequent falls/physical deconditioning Chronic Lower Extremity Weakness - significant L>R LE  weakness - denies numbness or saddle anesthesia - seems to be longstanding - has history of multiple surgeries to L knee and chronic pain related to that.  Weakness could be related to chronic deconditioning related to pain and lymphedema.   - Mostly wheelchair dependent at home since March 15, 2023.  PT/OT consulted.  SNF recommended   Hyperglycemia - Currently on sliding scale.  A1c of 5.8.  Blood sugar stable now   History of restless leg syndrome/chronic pain syndrome/anxiety/depression - On as needed oxycodone, hydroxyzine   Goals of care - Palliative care following.  DNR/DNI - Goal is to continue current care but family had opted for comfort care if he declined further (at this point, he's stabilized           Medications     amLODipine  5 mg Oral Daily   aspirin  81 mg Oral Daily   atorvastatin  10 mg Oral Daily   bisacodyl  10 mg Oral Daily   Chlorhexidine Gluconate Cloth  6 each Topical Daily   docusate sodium  100 mg Oral BID   feeding supplement  237 mL Oral BID BM   furosemide  40 mg Oral Daily   gabapentin  100 mg Oral BID   Gerhardt's butt cream   Topical BID   heparin  5,000 Units Subcutaneous Q8H   losartan  25 mg Oral Daily   multivitamin with minerals  1 tablet Oral Daily   naphazoline-glycerin  2 drop Left Eye QID   polyethylene glycol  17 g Oral Daily   pramipexole  0.125 mg Oral TID   prednisoLONE acetate  1 drop Right Eye TID     Data Reviewed:   CBG:  Recent Labs  Lab 10/27/23 1151  GLUCAP 113*    SpO2: 97 % O2 Flow Rate (L/min): 2 L/min FiO2 (%): 30 %    Vitals:   11/02/23 1029 11/02/23 1226 11/02/23 2015 11/03/23 0420  BP: (!) 161/76 135/66 (!) 147/68 (!) 155/82  Pulse: 71 (!) 58 71 76  Resp:  16    Temp:  98.7 F (37.1 C) 98.6 F (37 C) 98.9 F (37.2 C)  TempSrc:  Oral Oral Oral  SpO2:  96% 97% 97%  Weight:    94.3 kg  Height:          Data Reviewed:  Basic Metabolic Panel: Recent Labs  Lab 11/01/23 0556  11/02/23 0345  NA 135 137  K 3.2* 3.6  CL 102 105  CO2 26 26  GLUCOSE 108* 109*  BUN 20 21  CREATININE 0.51* 0.48*  CALCIUM 8.2* 8.3*  MG 2.1 2.0  PHOS 3.2 2.9    CBC: Recent Labs  Lab 11/01/23 0556 11/02/23 0345  WBC 8.3 8.0  NEUTROABS 6.4 5.7  HGB 10.9* 10.9*  HCT 36.0* 35.2*  MCV 97.8 95.9  PLT 573* 587*    LFT Recent Labs  Lab 11/01/23 0556 11/02/23 0345  AST 30 29  ALT 44 41  ALKPHOS 146* 148*  BILITOT 0.7 0.5  PROT 6.2* 6.4*  ALBUMIN 2.7* 2.8*     Antibiotics: Anti-infectives (From admission, onward)    Start     Dose/Rate Route Frequency Ordered Stop   10/22/23 2200  doxycycline (VIBRAMYCIN) 100 mg in sodium chloride  0.9 % 250 mL IVPB        100 mg 125 mL/hr over 120 Minutes Intravenous Every 12 hours 10/22/23 0807 10/25/23 0038   10/22/23 1000  cefTRIAXone (ROCEPHIN) 2 g in sodium chloride 0.9 % 100 mL IVPB        2 g 200 mL/hr over 30 Minutes Intravenous Every 24 hours 10/22/23 0807 10/24/23 1352   10/20/23 2200  vancomycin (VANCOREADY) IVPB 1500 mg/300 mL  Status:  Discontinued        1,500 mg 150 mL/hr over 120 Minutes Intravenous Every 24 hours 10/20/23 0922 10/22/23 0807   10/19/23 2200  vancomycin (VANCOREADY) IVPB 1750 mg/350 mL  Status:  Discontinued        1,750 mg 175 mL/hr over 120 Minutes Intravenous Every 24 hours 10/18/23 2223 10/20/23 0922   10/19/23 1200  metroNIDAZOLE (FLAGYL) IVPB 500 mg  Status:  Discontinued        500 mg 100 mL/hr over 60 Minutes Intravenous Every 12 hours 10/19/23 1042 10/22/23 0807   10/19/23 1130  cefTRIAXone (ROCEPHIN) 2 g in sodium chloride 0.9 % 100 mL IVPB  Status:  Discontinued        2 g 200 mL/hr over 30 Minutes Intravenous Every 24 hours 10/19/23 1042 10/22/23 0807   10/18/23 2230  vancomycin (VANCOREADY) IVPB 2000 mg/400 mL        2,000 mg 200 mL/hr over 120 Minutes Intravenous  Once 10/18/23 2130 10/19/23 0056   10/18/23 2200  ceFEPIme (MAXIPIME) 2 g in sodium chloride 0.9 % 100 mL IVPB   Status:  Discontinued        2 g 200 mL/hr over 30 Minutes Intravenous Every 8 hours 10/18/23 1533 10/19/23 1042   10/18/23 1530  ceFEPIme (MAXIPIME) 2 g in sodium chloride 0.9 % 100 mL IVPB        2 g 200 mL/hr over 30 Minutes Intravenous  Once 10/18/23 1446 10/18/23 1614   10/18/23 1215  cefTRIAXone (ROCEPHIN) 2 g in sodium chloride 0.9 % 100 mL IVPB        2 g 200 mL/hr over 30 Minutes Intravenous Once 10/18/23 1206 10/18/23 1325        DVT prophylaxis: Heparin  Code Status: DNR  Family Communication: No family at bedside   CONSULTS    Subjective   .   Objective    Physical Examination:     Status is: Inpatient:             Meredeth Ide   [facility] If 7PM-7AM, please contact night-coverage at [email or web address], Office  [phone]   11/03/2023, 8:40 AM  LOS: 16 days

## 2023-11-28 ENCOUNTER — Inpatient Hospital Stay (HOSPITAL_COMMUNITY)
Admission: EM | Admit: 2023-11-28 | Discharge: 2023-12-01 | DRG: 690 | Disposition: A | Discharge status: Hospice | Attending: Family Medicine | Admitting: Family Medicine

## 2023-11-28 ENCOUNTER — Other Ambulatory Visit: Payer: Self-pay

## 2023-11-28 ENCOUNTER — Encounter (HOSPITAL_COMMUNITY): Payer: Self-pay | Admitting: Emergency Medicine

## 2023-11-28 DIAGNOSIS — Z1612 Extended spectrum beta lactamase (ESBL) resistance: Secondary | ICD-10-CM | POA: Diagnosis present

## 2023-11-28 DIAGNOSIS — Z888 Allergy status to other drugs, medicaments and biological substances status: Secondary | ICD-10-CM

## 2023-11-28 DIAGNOSIS — Z91040 Latex allergy status: Secondary | ICD-10-CM

## 2023-11-28 DIAGNOSIS — Z7982 Long term (current) use of aspirin: Secondary | ICD-10-CM | POA: Diagnosis not present

## 2023-11-28 DIAGNOSIS — Z5986 Financial insecurity: Secondary | ICD-10-CM

## 2023-11-28 DIAGNOSIS — Z96653 Presence of artificial knee joint, bilateral: Secondary | ICD-10-CM | POA: Diagnosis present

## 2023-11-28 DIAGNOSIS — N3000 Acute cystitis without hematuria: Secondary | ICD-10-CM

## 2023-11-28 DIAGNOSIS — I890 Lymphedema, not elsewhere classified: Secondary | ICD-10-CM | POA: Diagnosis present

## 2023-11-28 DIAGNOSIS — I10 Essential (primary) hypertension: Secondary | ICD-10-CM | POA: Diagnosis present

## 2023-11-28 DIAGNOSIS — Z881 Allergy status to other antibiotic agents status: Secondary | ICD-10-CM

## 2023-11-28 DIAGNOSIS — I959 Hypotension, unspecified: Secondary | ICD-10-CM | POA: Diagnosis present

## 2023-11-28 DIAGNOSIS — Z961 Presence of intraocular lens: Secondary | ICD-10-CM | POA: Diagnosis present

## 2023-11-28 DIAGNOSIS — Z8249 Family history of ischemic heart disease and other diseases of the circulatory system: Secondary | ICD-10-CM

## 2023-11-28 DIAGNOSIS — R6510 Systemic inflammatory response syndrome (SIRS) of non-infectious origin without acute organ dysfunction: Secondary | ICD-10-CM | POA: Diagnosis present

## 2023-11-28 DIAGNOSIS — Z882 Allergy status to sulfonamides status: Secondary | ICD-10-CM

## 2023-11-28 DIAGNOSIS — E785 Hyperlipidemia, unspecified: Secondary | ICD-10-CM | POA: Diagnosis present

## 2023-11-28 DIAGNOSIS — F32A Depression, unspecified: Secondary | ICD-10-CM | POA: Diagnosis present

## 2023-11-28 DIAGNOSIS — Z9079 Acquired absence of other genital organ(s): Secondary | ICD-10-CM

## 2023-11-28 DIAGNOSIS — Z1624 Resistance to multiple antibiotics: Secondary | ICD-10-CM | POA: Diagnosis present

## 2023-11-28 DIAGNOSIS — Z993 Dependence on wheelchair: Secondary | ICD-10-CM

## 2023-11-28 DIAGNOSIS — I739 Peripheral vascular disease, unspecified: Secondary | ICD-10-CM | POA: Diagnosis present

## 2023-11-28 DIAGNOSIS — D649 Anemia, unspecified: Secondary | ICD-10-CM | POA: Diagnosis present

## 2023-11-28 DIAGNOSIS — Z66 Do not resuscitate: Secondary | ICD-10-CM | POA: Diagnosis present

## 2023-11-28 DIAGNOSIS — H9190 Unspecified hearing loss, unspecified ear: Secondary | ICD-10-CM | POA: Diagnosis present

## 2023-11-28 DIAGNOSIS — D461 Refractory anemia with ring sideroblasts: Secondary | ICD-10-CM | POA: Diagnosis present

## 2023-11-28 DIAGNOSIS — I82612 Acute embolism and thrombosis of superficial veins of left upper extremity: Secondary | ICD-10-CM | POA: Diagnosis present

## 2023-11-28 DIAGNOSIS — N390 Urinary tract infection, site not specified: Secondary | ICD-10-CM | POA: Diagnosis present

## 2023-11-28 DIAGNOSIS — E876 Hypokalemia: Secondary | ICD-10-CM | POA: Diagnosis present

## 2023-11-28 DIAGNOSIS — Z9841 Cataract extraction status, right eye: Secondary | ICD-10-CM

## 2023-11-28 DIAGNOSIS — G2581 Restless legs syndrome: Secondary | ICD-10-CM | POA: Diagnosis present

## 2023-11-28 DIAGNOSIS — Z7189 Other specified counseling: Secondary | ICD-10-CM | POA: Diagnosis not present

## 2023-11-28 DIAGNOSIS — Z9049 Acquired absence of other specified parts of digestive tract: Secondary | ICD-10-CM

## 2023-11-28 DIAGNOSIS — Z8551 Personal history of malignant neoplasm of bladder: Secondary | ICD-10-CM

## 2023-11-28 DIAGNOSIS — E8809 Other disorders of plasma-protein metabolism, not elsewhere classified: Secondary | ICD-10-CM | POA: Diagnosis present

## 2023-11-28 DIAGNOSIS — N3001 Acute cystitis with hematuria: Principal | ICD-10-CM

## 2023-11-28 DIAGNOSIS — Z79899 Other long term (current) drug therapy: Secondary | ICD-10-CM

## 2023-11-28 DIAGNOSIS — Z880 Allergy status to penicillin: Secondary | ICD-10-CM

## 2023-11-28 DIAGNOSIS — Z87440 Personal history of urinary (tract) infections: Secondary | ICD-10-CM

## 2023-11-28 DIAGNOSIS — F419 Anxiety disorder, unspecified: Secondary | ICD-10-CM | POA: Diagnosis present

## 2023-11-28 DIAGNOSIS — Z515 Encounter for palliative care: Secondary | ICD-10-CM | POA: Diagnosis not present

## 2023-11-28 DIAGNOSIS — R5381 Other malaise: Secondary | ICD-10-CM | POA: Diagnosis present

## 2023-11-28 LAB — COMPREHENSIVE METABOLIC PANEL WITH GFR
ALT: 19 U/L (ref 0–44)
AST: 18 U/L (ref 15–41)
Albumin: 3.1 g/dL — ABNORMAL LOW (ref 3.5–5.0)
Alkaline Phosphatase: 113 U/L (ref 38–126)
Anion gap: 14 (ref 5–15)
BUN: 18 mg/dL (ref 8–23)
CO2: 25 mmol/L (ref 22–32)
Calcium: 9 mg/dL (ref 8.9–10.3)
Chloride: 99 mmol/L (ref 98–111)
Creatinine, Ser: 0.39 mg/dL — ABNORMAL LOW (ref 0.61–1.24)
GFR, Estimated: >60 mL/min (ref >60)
Glucose, Bld: 112 mg/dL — ABNORMAL HIGH (ref 70–99)
Potassium: 2.9 mmol/L — ABNORMAL LOW (ref 3.5–5.1)
Sodium: 138 mmol/L (ref 135–145)
Total Bilirubin: 0.8 mg/dL (ref 0.0–1.2)
Total Protein: 7 g/dL (ref 6.5–8.1)

## 2023-11-28 LAB — URINALYSIS, ROUTINE W REFLEX MICROSCOPIC
Bilirubin Urine: NEGATIVE
Glucose, UA: NEGATIVE mg/dL
Ketones, ur: NEGATIVE mg/dL
Nitrite: POSITIVE — AB
Protein, ur: 100 mg/dL — AB
RBC / HPF: >50 RBC/hpf (ref 0–5)
Specific Gravity, Urine: 1.021 (ref 1.005–1.030)
WBC, UA: >50 WBC/hpf (ref 0–5)
pH: 5 (ref 5.0–8.0)

## 2023-11-28 LAB — MAGNESIUM: Magnesium: 1.6 mg/dL — ABNORMAL LOW (ref 1.7–2.4)

## 2023-11-28 LAB — PROTIME-INR
INR: 1.1 (ref 0.8–1.2)
Prothrombin Time: 14.6 s (ref 11.4–15.2)

## 2023-11-28 LAB — PHOSPHORUS: Phosphorus: 3 mg/dL (ref 2.5–4.6)

## 2023-11-28 LAB — CBC
HCT: 41.8 % (ref 39.0–52.0)
Hemoglobin: 13.3 g/dL (ref 13.0–17.0)
MCH: 29.6 pg (ref 26.0–34.0)
MCHC: 31.8 g/dL (ref 30.0–36.0)
MCV: 92.9 fL (ref 80.0–100.0)
Platelets: 201 10*3/uL (ref 150–400)
RBC: 4.5 MIL/uL (ref 4.22–5.81)
RDW: 13.4 % (ref 11.5–15.5)
WBC: 9 10*3/uL (ref 4.0–10.5)
nRBC: 0 % (ref 0.0–0.2)

## 2023-11-28 LAB — LACTIC ACID, PLASMA: Lactic Acid, Venous: 1.1 mmol/L (ref 0.5–1.9)

## 2023-11-28 MED ORDER — ATORVASTATIN CALCIUM 10 MG PO TABS: 10.0000 mg | ORAL_TABLET | Freq: Every day | ORAL | Status: DC

## 2023-11-28 MED ORDER — HYDRALAZINE HCL 20 MG/ML IJ SOLN: 10.0000 mg | INTRAMUSCULAR | Status: DC | PRN

## 2023-11-28 MED ORDER — FLEET ENEMA RE ENEM: 1.0000 | ENEMA | Freq: Once | RECTAL | Status: DC | PRN

## 2023-11-28 MED ORDER — GABAPENTIN 100 MG PO CAPS
100.0000 mg | ORAL_CAPSULE | Freq: Two times a day (BID) | ORAL | Status: DC
  Administered 2023-11-28 – 2023-12-01 (×6): 100 mg via ORAL
  Filled 2023-11-28 (×6): qty 1

## 2023-11-28 MED ORDER — ACETAMINOPHEN 325 MG PO TABS: 650.0000 mg | ORAL_TABLET | Freq: Four times a day (QID) | ORAL | Status: DC | PRN

## 2023-11-28 MED ORDER — POTASSIUM CHLORIDE CRYS ER 20 MEQ PO TBCR
40.0000 meq | EXTENDED_RELEASE_TABLET | Freq: Once | ORAL | Status: AC
  Administered 2023-11-28: 40 meq via ORAL
  Filled 2023-11-28: qty 2

## 2023-11-28 MED ORDER — SENNOSIDES-DOCUSATE SODIUM 8.6-50 MG PO TABS: 1.0000 | ORAL_TABLET | Freq: Every evening | ORAL | Status: DC | PRN

## 2023-11-28 MED ORDER — SODIUM CHLORIDE 0.9 % IV SOLN: INTRAVENOUS | Status: DC

## 2023-11-28 MED ORDER — SODIUM CHLORIDE 0.9 % IV SOLN
2.0000 g | INTRAVENOUS | Status: DC
  Administered 2023-11-28 – 2023-11-29 (×2): 2 g via INTRAVENOUS
  Filled 2023-11-28 (×2): qty 20

## 2023-11-28 MED ORDER — ORAL CARE MOUTH RINSE: 15.0000 mL | OROMUCOSAL | Status: DC | PRN

## 2023-11-28 MED ORDER — TRAZODONE HCL 50 MG PO TABS: 25.0000 mg | ORAL_TABLET | Freq: Every evening | ORAL | Status: DC | PRN

## 2023-11-28 MED ORDER — VITAMIN D (ERGOCALCIFEROL) 1.25 MG (50000 UNIT) PO CAPS: 50000.0000 [IU] | ORAL_CAPSULE | ORAL | Status: DC

## 2023-11-28 MED ORDER — SODIUM CHLORIDE 0.9 % IV SOLN: 1.0000 g | Freq: Once | INTRAVENOUS | Status: DC

## 2023-11-28 MED ORDER — VITAMIN D 25 MCG (1000 UNIT) PO TABS: 1000.0000 [IU] | ORAL_TABLET | ORAL | Status: DC

## 2023-11-28 MED ORDER — ENSURE ENLIVE PO LIQD
237.0000 mL | Freq: Two times a day (BID) | ORAL | Status: DC
  Administered 2023-11-29 – 2023-12-01 (×5): 237 mL via ORAL
  Filled 2023-11-28 (×2): qty 237

## 2023-11-28 MED ORDER — HEPARIN SODIUM (PORCINE) 5000 UNIT/ML IJ SOLN
5000.0000 [IU] | Freq: Three times a day (TID) | INTRAMUSCULAR | Status: DC
  Administered 2023-11-28 – 2023-12-01 (×9): 5000 [IU] via SUBCUTANEOUS
  Filled 2023-11-28 (×8): qty 1

## 2023-11-28 MED ORDER — OXYCODONE HCL 5 MG PO TABS
5.0000 mg | ORAL_TABLET | ORAL | Status: DC | PRN
  Administered 2023-11-28: 5 mg via ORAL
  Filled 2023-11-28: qty 1

## 2023-11-28 MED ORDER — IPRATROPIUM BROMIDE 0.02 % IN SOLN: 0.5000 mg | Freq: Four times a day (QID) | RESPIRATORY_TRACT | Status: DC | PRN

## 2023-11-28 MED ORDER — HYDROMORPHONE HCL 1 MG/ML IJ SOLN: 0.5000 mg | INTRAMUSCULAR | Status: DC | PRN

## 2023-11-28 MED ORDER — LEVALBUTEROL HCL 0.63 MG/3ML IN NEBU: 0.6300 mg | INHALATION_SOLUTION | Freq: Four times a day (QID) | RESPIRATORY_TRACT | Status: DC | PRN

## 2023-11-28 MED ORDER — ACETAMINOPHEN 650 MG RE SUPP: 650.0000 mg | Freq: Four times a day (QID) | RECTAL | Status: DC | PRN

## 2023-11-28 MED ORDER — TAMSULOSIN HCL 0.4 MG PO CAPS
0.4000 mg | ORAL_CAPSULE | Freq: Every day | ORAL | Status: DC
Start: 2023-11-29 — End: 2023-12-01
  Administered 2023-11-29 – 2023-12-01 (×3): 0.4 mg via ORAL
  Filled 2023-11-28 (×3): qty 1

## 2023-11-28 MED ORDER — ONDANSETRON HCL 4 MG PO TABS: 4.0000 mg | ORAL_TABLET | Freq: Four times a day (QID) | ORAL | Status: DC | PRN

## 2023-11-28 MED ORDER — ASPIRIN 81 MG PO TBEC
81.0000 mg | DELAYED_RELEASE_TABLET | Freq: Every day | ORAL | Status: DC
  Administered 2023-11-28 – 2023-12-01 (×4): 81 mg via ORAL
  Filled 2023-11-28 (×4): qty 1

## 2023-11-28 MED ORDER — ALBUMIN HUMAN 25 % IV SOLN
25.0000 g | Freq: Once | INTRAVENOUS | Status: AC
  Administered 2023-11-28: 25 g via INTRAVENOUS
  Filled 2023-11-28: qty 100

## 2023-11-28 MED ORDER — PRAMIPEXOLE DIHYDROCHLORIDE 0.25 MG PO TABS
0.2500 mg | ORAL_TABLET | Freq: Three times a day (TID) | ORAL | Status: DC
  Administered 2023-11-28 – 2023-12-01 (×9): 0.25 mg via ORAL
  Filled 2023-11-28 (×11): qty 1

## 2023-11-28 MED ORDER — BUPROPION HCL ER (XL) 150 MG PO TB24
150.0000 mg | ORAL_TABLET | Freq: Every day | ORAL | Status: DC
  Administered 2023-11-29 – 2023-12-01 (×3): 150 mg via ORAL
  Filled 2023-11-28 (×3): qty 1

## 2023-11-28 MED ORDER — ONDANSETRON HCL 4 MG/2ML IJ SOLN: 4.0000 mg | Freq: Four times a day (QID) | INTRAMUSCULAR | Status: DC | PRN

## 2023-11-28 MED ORDER — SODIUM CHLORIDE 0.9 % IV SOLN: INTRAVENOUS | Status: AC

## 2023-11-28 MED ORDER — SERTRALINE HCL 50 MG PO TABS
200.0000 mg | ORAL_TABLET | Freq: Every day | ORAL | Status: DC
  Administered 2023-11-29 – 2023-12-01 (×3): 200 mg via ORAL
  Filled 2023-11-28 (×3): qty 4

## 2023-11-28 MED ORDER — SODIUM CHLORIDE 0.9% FLUSH
3.0000 mL | Freq: Two times a day (BID) | INTRAVENOUS | Status: DC
  Administered 2023-11-28 – 2023-12-01 (×6): 3 mL via INTRAVENOUS

## 2023-11-28 MED ORDER — MAGNESIUM SULFATE 2 GM/50ML IV SOLN
2.0000 g | Freq: Once | INTRAVENOUS | Status: AC
  Administered 2023-11-28: 2 g via INTRAVENOUS
  Filled 2023-11-28: qty 50

## 2023-11-28 MED ORDER — FLORANEX PO PACK
1.0000 g | PACK | Freq: Three times a day (TID) | ORAL | Status: DC
  Administered 2023-11-28 – 2023-12-01 (×7): 1 g via ORAL
  Filled 2023-11-28 (×8): qty 1

## 2023-11-28 MED ORDER — SODIUM CHLORIDE 0.9% FLUSH
3.0000 mL | Freq: Two times a day (BID) | INTRAVENOUS | Status: DC
  Administered 2023-11-28 – 2023-11-30 (×4): 3 mL via INTRAVENOUS

## 2023-11-28 MED ORDER — BISACODYL 5 MG PO TBEC: 5.0000 mg | DELAYED_RELEASE_TABLET | Freq: Every day | ORAL | Status: DC | PRN

## 2023-11-28 NOTE — ED Notes (Signed)
 ED TO INPATIENT HANDOFF REPORT  ED Nurse Name and Phone #:   S Name/Age/Gender Garrett Ferguson 78 y.o. male Room/Bed: APA10/APA10  Code Status   Code Status: Do not attempt resuscitation (DNR) PRE-ARREST INTERVENTIONS DESIRED  Home/SNF/Other Nursing Home Patient oriented to: self, place, time, and situation Is this baseline? Yes   Triage Complete: Triage complete  Chief Complaint UTI (urinary tract infection) [N39.0] SIRS (systemic inflammatory response syndrome) (HCC) [R65.10]  Triage Note Pt from home via [facility] with reports of UTI. Pt states his home health nurse wanted him to come to the ER. Pt states he has been treated x 5 days with antibiotic for known UTI.    Allergies Allergies  Allergen Reactions   Sulfamethoxazole-Trimethoprim    Diclofenac Sodium Rash   Latex Rash   Levofloxacin Rash    Pt developed red spots on leg and itching 2-3 hours after taking po Levaquin   Penicillins Rash    Level of Care/Admitting Diagnosis ED Disposition     ED Disposition  Admit   Condition  --   Comment  Hospital Area: [facility] [[id number]]  Level of Care: Telemetry [5]  Covid Evaluation: Confirmed COVID Negative  Diagnosis: SIRS (systemic inflammatory response syndrome) [facility]) [[id number]]  Admitting Physician: Felipa Furnace  Attending Physician: Kendell Bane [phone]  Certification:: I certify this patient will need inpatient services for at least 2 midnights          B Medical/Surgery History 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   GERD (gastroesophageal reflux disease)    H/O: rheumatic fever    age 7   Hearing loss    History of ulcer disease 04/14/1969    Hypertension    Incontinence of urine    Restless leg syndrome    Restless legs syndrome (RLS) 08/08/2013   RSD (reflex sympathetic dystrophy)    L hand   Past Surgical History:  Procedure Laterality Date   BLADDER SURGERY     transitional cell carcinoma   carpall tunnel     L hand   CATARACT EXTRACTION W/PHACO Right 02/23/2023   Procedure: CATARACT EXTRACTION PHACO AND INTRAOCULAR LENS PLACEMENT (IOC);  Surgeon: Pecolia Ades, MD;  Location: [facility];  Service: Ophthalmology;  Laterality: Right;  CDE 11.94   CHOLECYSTECTOMY  08/14/1977   FOOT SURGERY     x5   JOINT REPLACEMENT Right replacements x3   l knee with multiple surg on same knee   NASAL SINUS SURGERY  04/14/1989   PROSTATE BIOPSY     TOTAL KNEE REVISION  04/23/2012   Procedure: TOTAL KNEE REVISION;  Surgeon: Eulas Post, MD;  Location: [facility];  Service: Orthopedics;  Laterality: Left;   TRANSURETHRAL RESECTION OF PROSTATE       A IV Location/Drains/Wounds Patient Lines/Drains/Airways Status     Active Line/Drains/Airways     Name Placement date Placement time Site Days   Peripheral IV 11/28/23 20 G Anterior;Right Forearm 11/28/23  1238  Forearm  less than 1   Wound / Incision (Open or Dehisced) 10/19/23 Other (Comment) Tibial Left;Posterior 10/19/23  0300  Tibial  40  Wound / Incision (Open or Dehisced) 11/01/23 Irritant Dermatitis (Moisture Associated Skin Damage) Vertebral column Medial moisture associated tear to the skin fold on lower middle back 11/01/23  1141  Vertebral column  27   Wound / Incision (Open or Dehisced) 11/01/23 (IAD) Incontinence Associated Dermatitis Scrotum Medial 11/01/23  1142  Scrotum  27            Intake/Output Last 24 hours No intake or output data in the 24 hours ending 11/28/23 1536  Labs/Imaging Results for orders placed or performed during the hospital encounter of 11/28/23 (from the past 48 hours)  Urinalysis, Routine w reflex microscopic -Urine, Clean Catch      Status: Abnormal   Collection Time: 11/28/23 12:30 PM  Result Value Ref Range   Color, Urine AMBER (A) YELLOW    Comment: BIOCHEMICALS MAY BE AFFECTED BY COLOR   APPearance CLOUDY (A) CLEAR   Specific Gravity, Urine 1.021 1.005 - 1.030   pH 5.0 5.0 - 8.0   Glucose, UA NEGATIVE NEGATIVE mg/dL   Hgb urine dipstick MODERATE (A) NEGATIVE   Bilirubin Urine NEGATIVE NEGATIVE   Ketones, ur NEGATIVE NEGATIVE mg/dL   Protein, ur 161 (A) NEGATIVE mg/dL   Nitrite POSITIVE (A) NEGATIVE   Leukocytes,Ua MODERATE (A) NEGATIVE   RBC / HPF >50 0 - 5 RBC/hpf   WBC, UA >50 0 - 5 WBC/hpf   Bacteria, UA MANY (A) NONE SEEN   Squamous Epithelial / HPF 11-20 0 - 5 /HPF   WBC Clumps PRESENT    Mucus PRESENT    Hyaline Casts, UA PRESENT    Non Squamous Epithelial 0-5 (A) NONE SEEN    Comment: Performed at [facility], [address] [place], [place] [address]  CBC     Status: None   Collection Time: 11/28/23  1:02 PM  Result Value Ref Range   WBC 9.0 4.0 - 10.5 K/uL   RBC 4.50 4.22 - 5.81 MIL/uL   Hemoglobin 13.3 13.0 - 17.0 g/dL   HCT 54.0 98.1 - 19.1 %   MCV 92.9 80.0 - 100.0 fL   MCH 29.6 26.0 - 34.0 pg   MCHC 31.8 30.0 - 36.0 g/dL   RDW 47.8 29.5 - 62.1 %   Platelets 201 150 - 400 K/uL   nRBC 0.0 0.0 - 0.2 %    Comment: Performed at [facility], [address] [place], [place] [address]  Comprehensive metabolic panel     Status: Abnormal   Collection Time: 11/28/23  1:02 PM  Result Value Ref Range   Sodium 138 135 - 145 mmol/L   Potassium 2.9 (L) 3.5 - 5.1 mmol/L   Chloride 99 98 - 111 mmol/L   CO2 25 22 - 32 mmol/L   Glucose, Bld 112 (H) 70 - 99 mg/dL    Comment: Glucose reference range applies only to samples taken after fasting for at least 8 hours.   BUN 18 8 - 23 mg/dL   Creatinine, Ser 7.84 (L) 0.61 - 1.24 mg/dL   Calcium 9.0 8.9 - 69.6 mg/dL   Total Protein 7.0 6.5 - 8.1 g/dL   Albumin 3.1 (L) 3.5 - 5.0 g/dL   AST 18 15 - 41 U/L   ALT 19 0 - 44 U/L   Alkaline Phosphatase 113  38 - 126 U/L   Total Bilirubin 0.8 0.0 - 1.2 mg/dL   GFR, Estimated >29 >52 mL/min    Comment: (NOTE) Calculated using the CKD-EPI Creatinine Equation (2021)  Anion gap 14 5 - 15    Comment: Performed at [facility], [address], [place], [place] [address]  Magnesium     Status: Abnormal   Collection Time: 11/28/23  1:02 PM  Result Value Ref Range   Magnesium 1.6 (L) 1.7 - 2.4 mg/dL    Comment: Performed at [facility], [address], [place], [place] [address]  Protime-INR     Status: None   Collection Time: 11/28/23  2:06 PM  Result Value Ref Range   Prothrombin Time 14.6 11.4 - 15.2 seconds   INR 1.1 0.8 - 1.2    Comment: (NOTE) INR goal varies based on device and disease states. Performed at [facility], [address] [place], [place] [address]   Lactic acid, plasma     Status: None   Collection Time: 11/28/23  2:35 PM  Result Value Ref Range   Lactic Acid, Venous 1.1 0.5 - 1.9 mmol/L    Comment: Performed at [facility], [address] [place], [place] [address]   No results found.  Pending Labs Unresulted Labs (From admission, onward)     Start     Ordered   11/29/23 0500  Basic metabolic panel  Daily,   R      11/28/23 1406   11/29/23 0500  CBC  Daily,   R      11/28/23 1406   11/28/23 1408  Culture, blood (Routine X 2) w Reflex to ID Panel  BLOOD CULTURE X 2,   R (with TIMED occurrences)      11/28/23 1407   11/28/23 1406  Phosphorus  Add-on,   AD        11/28/23 1406   11/28/23 1406  Expectorated Sputum Assessment w Gram Stain, Rflx to Resp Cult  Once,   R       Comments: If productive cough    11/28/23 1406   11/28/23 1405  Urine Culture (for pregnant, neutropenic or urologic patients or patients with an indwelling urinary catheter)  (Urine Labs)  Once,   URGENT       Question:  Indication  Answer:  Dysuria   11/28/23 1405            Vitals/Pain Today's Vitals   11/28/23 1222 11/28/23 1245  BP: (!) 143/80 106/63  Pulse: 87 69  Resp: 18  (!) 22  Temp: 97.6 F (36.4 C)   SpO2: 97% 95%    Isolation Precautions No active isolations  Medications Medications  magnesium sulfate IVPB 2 g 50 mL (2 g Intravenous New Bag/Given 11/28/23 1512)  heparin injection 5,000 Units (has no administration in time range)  sodium chloride flush (NS) 0.9 % injection 3 mL (has no administration in time range)  sodium chloride flush (NS) 0.9 % injection 3 mL (has no administration in time range)  acetaminophen (TYLENOL) tablet 650 mg (has no administration in time range)    Or  acetaminophen (TYLENOL) suppository 650 mg (has no administration in time range)  oxyCODONE (Oxy IR/ROXICODONE) immediate release tablet 5 mg (has no administration in time range)  HYDROmorphone (DILAUDID) injection 0.5-1 mg (has no administration in time range)  traZODone (DESYREL) tablet 25 mg (has no administration in time range)  senna-docusate (Senokot-S) tablet 1 tablet (has no administration in time range)  bisacodyl (DULCOLAX) EC tablet 5 mg (has no administration in time range)  ondansetron (ZOFRAN) tablet 4 mg (has no administration in time range)    Or  ondansetron (ZOFRAN) injection 4 mg (  has no administration in time range)  ipratropium (ATROVENT) nebulizer solution 0.5 mg (has no administration in time range)  levalbuterol (XOPENEX) nebulizer solution 0.63 mg (has no administration in time range)  hydrALAZINE (APRESOLINE) injection 10 mg (has no administration in time range)  cefTRIAXone (ROCEPHIN) 2 g in sodium chloride 0.9 % 100 mL IVPB (2 g Intravenous New Bag/Given 11/28/23 1427)  0.9 %  sodium chloride infusion (has no administration in time range)  lactobacillus (FLORANEX/LACTINEX) granules 1 g (has no administration in time range)  albumin human 25 % solution 25 g (has no administration in time range)  aspirin EC tablet 81 mg (has no administration in time range)  atorvastatin (LIPITOR) tablet 10 mg (has no administration in time range)  buPROPion  (WELLBUTRIN XL) 24 hr tablet 150 mg (has no administration in time range)  sertraline (ZOLOFT) tablet 200 mg (has no administration in time range)  tamsulosin (FLOMAX) capsule 0.4 mg (has no administration in time range)  gabapentin (NEURONTIN) capsule 100 mg (has no administration in time range)  pramipexole (MIRAPEX) tablet 0.25 mg (has no administration in time range)  feeding supplement (ENSURE ENLIVE / ENSURE PLUS) liquid 237 mL (has no administration in time range)  Vitamin D (Ergocalciferol) (DRISDOL) 1.25 MG (50000 UNIT) capsule 50,000 Units (has no administration in time range)  potassium chloride SA (KLOR-CON M) CR tablet 40 mEq (40 mEq Oral Given 11/28/23 1423)    Mobility non-ambulatory     Focused Assessments    R Recommendations: See Admitting Provider Note  Report given to:   Additional Notes:

## 2023-11-28 NOTE — Consult Note (Signed)
 Consultation Note Date: 11/28/2023   Patient Name: Garrett Ferguson  DOB: 1945/09/13  MRN: [id number]  Age / Sex: 78 y.o., male  PCP: [facility] [place] [facility] Referring Physician: Bobbetta Burnet, MD  Reason for Consultation: Establishing goals of care  HPI/Patient Profile: 78 y.o. male  with past medical history of recent hospitalization for sepsis with rehab placement on discharge, bladder cancer, hypertension, restless leg syndrome, chronic lymphedema, peripheral arterial disease, chronic kidney disease, gastroesophageal reflux admitted on 11/28/2023 with UTI meeting SIRS criteria.   Clinical Assessment and Goals of Care: Face-to-face discussion with bedside nursing staff related to patient condition, needs.  I have reviewed medical records including EPIC notes, labs and imaging, received report from RN, assessed the patient.  Garrett Ferguson is lying quietly on the stretcher in the ED.  He appears chronically ill and frail.  He greets me, making and mostly keeping eye contact.  He is alert and oriented x 3, able to make his needs known.  His wife of 55 years, Garrett Ferguson and his son are present at bedside.  We meet at the bedside to discuss diagnosis prognosis, GOC, EOL wishes, disposition and options.  I introduced Palliative Medicine as specialized medical care for people living with serious illness. It focuses on providing relief from the symptoms and stress of a serious illness. The goal is to improve quality of life for both the patient and the family.  We discussed a brief life review of the patient.  Garrett Ferguson have been married for 55 years.  Their adult disabled son lives in their home.  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  [name] has had a recent hospital stay with rehab afterwards.  Garrett Ferguson states that he came home Friday and has been unable to move himself.  She tearfully  states that she does not feel that she can care for him any longer.  We then focused on their current illness.  We talked about his UTI and the treatment plan.  We talked about time for outcomes.  We talked about rehospitalization when Garrett Ferguson gets sick again.  At this point they would rehospitalize.  The natural disease trajectory and expectations at EOL were discussed.  Advanced directives, concepts specific to code status, artifical feeding and hydration, and rehospitalization were considered and discussed.  DNR/DNI verified.  Hospice Care services outpatient were explained and offered.  Mr. Garrett Ferguson are agreeable to hospice referral.  Provider choice offered, they choose [facility].  I share that hospice will send an aide 3 times a week to help with bathing, registered nurse every week or 2, but most of the work falls to the family.  Garrett Ferguson states that she needs this help.  I am concerned if this will not be enough help for her.  Discussed the importance of continued conversation with family and the medical providers regarding overall plan of care and treatment options, ensuring decisions are within the context of the patient's values and GOCs.  Questions  and concerns were addressed.   The family was encouraged to call with questions or concerns.  PMT will continue to support holistically.   Conference with attending, bedside nursing staff, transitional care team related to patient condition, needs, goals of care, disposition.    HCPOA  NEXT OF KIN -wife of 55 years, Garrett Ferguson.    SUMMARY OF RECOMMENDATIONS   Continue to treat the treatable but no CPR or intubation Home via EMS with the benefits of [facility] hospice care At this point would rehospitalize as needed Requesting hospital bed   Code Status/Advance Care Planning: DNR  Symptom Management:  Per hospitalist, no additional needs at this time.  Palliative Prophylaxis:  Frequent Pain Assessment and Oral  Care  Additional Recommendations (Limitations, Scope, Preferences): Continue to treat but no CPR or intubation  Psycho-social/Spiritual:  Desire for further Chaplaincy support:no Additional Recommendations: Caregiving  Support/Resources and Education on Hospice  Prognosis:  < 3 months, anticipated based on decreased functional status, frailty, recurrent UTI.  Discharge Planning: Home with Hospice      Primary Diagnoses: Present on Admission:  UTI (urinary tract infection)  Physical deconditioning  Hypotensive episode  Hypoalbuminemia  Hypertension  Restless leg syndrome  Hyperlipidemia  Depression  SIRS (systemic inflammatory response syndrome) (HCC)   I have reviewed the medical record, interviewed the patient and family, and examined the patient. The following aspects are pertinent.  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   GERD (gastroesophageal reflux disease)    H/O: rheumatic fever    age 61   Hearing loss    History of ulcer disease 04/14/1969   Hypertension    Incontinence of urine    Restless leg syndrome    Restless legs syndrome (RLS) 08/08/2013   RSD (reflex sympathetic dystrophy)    L hand   Social History   Socioeconomic History   Marital status: Married    Spouse name: Garrett Ferguson   Number of children: 1   Years of education: college   Highest education level: Not on file  Occupational History    Employer: [employer]  Tobacco Use   Smoking status: Never   Smokeless tobacco: Never  Vaping Use   Vaping status: Never Used  Substance and Sexual Activity   Alcohol use: No   Drug use: No   Sexual activity: Not on file  Other Topics Concern   Not on file   Social History Narrative   Patient is married and lives with his wife(Garrett Ferguson).   Patient has a degree from a divinity school.   Patient is a retired Optician, dispensing.   Patient has one child.   Patient drinks some tea and diet soda occasionally.   Patient is right handed.   Social Drivers of Health   Financial Resource Strain: Medium Risk (04/06/2021)   Received from [facility], [facility]   Overall Financial Resource Strain (CARDIA)    Difficulty of Paying Living Expenses: Somewhat hard  Food Insecurity: Patient Unable To Answer (10/21/2023)   Hunger Vital Sign    Worried About Running Out of Food in the Last Year: Patient unable to answer  Ran Out of Food in the Last Year: Patient unable to answer  Transportation Needs: Patient Unable To Answer (10/21/2023)   PRAPARE - Transportation    Lack of Transportation (Medical): Patient unable to answer    Lack of Transportation (Non-Medical): Patient unable to answer  Physical Activity: Insufficiently Active (02/23/2021)   [facility], [facility]   Exercise Vital Sign    Days of Exercise per Week: 5 days    Minutes of Exercise per Session: 10 min  Stress: No Stress Concern Present (02/23/2021)   [facility], [facility] of Occupational Health - Occupational Stress Questionnaire    Feeling of Stress : Not at all  Social Connections: Patient Unable To Answer (10/21/2023)   Social Connection and Isolation Panel [NHANES]    Frequency of Communication with Friends and Family: Patient unable to answer    Frequency of Social Gatherings with Friends and Family: Patient unable to answer    Attends Religious Services: Patient unable to answer    Active Member of Clubs or Organizations: Patient unable to answer    Attends Banker Meetings: Patient unable to answer    Marital Status: Patient unable to answer   Family History  Problem Relation Age of Onset   Cancer  Father    Melanoma Father    Heart disease Sister    Cancer Other    Melanoma Other    High blood pressure Other    Scheduled Meds:  aspirin EC  81 mg Oral Daily   [START ON 11/29/2023] atorvastatin  10 mg Oral Daily   [START ON 11/29/2023] buPROPion  150 mg Oral Daily   feeding supplement  237 mL Oral BID BM   gabapentin  100 mg Oral BID   heparin  5,000 Units Subcutaneous Q8H   lactobacillus  1 g Oral TID WC   pramipexole  0.25 mg Oral TID   [START ON 11/29/2023] sertraline  200 mg Oral Daily   sodium chloride flush  3 mL Intravenous Q12H   sodium chloride flush  3 mL Intravenous Q12H   [START ON 11/29/2023] tamsulosin  0.4 mg Oral Daily   [START ON 12/05/2023] Vitamin D (Ergocalciferol)  50,000 Units Oral Q7 days   Continuous Infusions:  sodium chloride     albumin human     cefTRIAXone (ROCEPHIN)  IV 2 g (11/28/23 1427)   magnesium sulfate bolus IVPB     PRN Meds:.acetaminophen **OR** acetaminophen, bisacodyl, hydrALAZINE, HYDROmorphone (DILAUDID) injection, ipratropium, levalbuterol, ondansetron **OR** ondansetron (ZOFRAN) IV, oxyCODONE, senna-docusate, traZODone Medications Prior to Admission:  Prior to Admission medications   Medication Sig Start Date End Date Taking? Authorizing Provider  acetaminophen (TYLENOL) 325 MG tablet Take 2 tablets (650 mg total) by mouth every 6 (six) hours as needed for mild pain (pain score 1-3) or fever. 11/03/23  Yes Meredeth Ide, MD  amLODipine (NORVASC) 5 MG tablet Take 1 tablet (5 mg total) by mouth daily. 11/04/23  Yes Meredeth Ide, MD  atorvastatin (LIPITOR) 10 MG tablet Take 10 mg by mouth daily.   Yes [provider]  buPROPion (WELLBUTRIN XL) 150 MG 24 hr tablet Take 150 mg by mouth daily. 10/13/16  Yes [provider]  Cholecalciferol (VITAMIN D3) 1.25 MG (50000 UT) CAPS Take 1 capsule by mouth once a week. 11/24/23  Yes [provider]  feeding supplement (ENSURE ENLIVE / ENSURE PLUS) LIQD Take 237 mLs by mouth 2  (  two) times daily between meals. 11/03/23  Yes Ozell Blunt, MD  gabapentin (NEURONTIN) 100 MG capsule Take 1 capsule (100 mg total) by mouth 2 (two) times daily. 11/03/23  Yes Ozell Blunt, MD  losartan (COZAAR) 25 MG tablet Take 1 tablet (25 mg total) by mouth daily. 11/04/23  Yes Ozell Blunt, MD  naphazoline-glycerin (CLEAR EYES REDNESS) 0.012-0.25 % SOLN Place 2 drops into the left eye 4 (four) times daily as needed for eye irritation. 03/23/23  Yes Tat, Myrtie Atkinson, MD  oxyCODONE (OXY IR/ROXICODONE) 5 MG immediate release tablet Take 1-2 tablets (5-10 mg total) by mouth every 6 (six) hours as needed for moderate pain (pain score 4-6) or severe pain (pain score 7-10). 11/03/23  Yes Alfonse Angle, Benuel Brazier, MD  pramipexole (MIRAPEX) 1 MG tablet TAKE 1 TABLET BY MOUTH TWICE DURING THE DAY AND ONE AND ONE-HALF TABLETS AT NIGHT 10/18/16  Yes Raquel Cables, NP  sertraline (ZOLOFT) 100 MG tablet Take 200 mg by mouth daily.   Yes [provider]  tamsulosin (FLOMAX) 0.4 MG CAPS capsule Take 0.4 mg by mouth daily.   Yes [provider]  triamcinolone cream (KENALOG) 0.1 % Apply topically 3 (three) times a week. 11/24/23  Yes [provider]  aspirin EC 81 MG tablet Take 81 mg by mouth daily.    [provider]   Allergies  Allergen Reactions   Sulfamethoxazole-Trimethoprim    Diclofenac Sodium Rash   Latex Rash   Levofloxacin Rash    Pt developed red spots on leg and itching 2-3 hours after taking po Levaquin   Penicillins Rash   Review of Systems  Unable to perform ROS: Acuity of condition    Physical Exam Vitals and nursing note reviewed.     Vital Signs: BP 106/63   Pulse 69   Temp 97.6 F (36.4 C)   Resp (!) 22   SpO2 95%  Pain Scale: Faces       SpO2: SpO2: 95 % O2 Device:SpO2: 95 % O2 Flow Rate: .   IO: Intake/output summary: No intake or output data in the 24 hours ending 11/28/23 1511  LBM:   Baseline Weight:   Most recent weight:        Palliative Assessment/Data:     Time In: 1450 Time Out: 1605 Time Total: 75 minutes  Greater than 50%  of this time was spent counseling and coordinating care related to the above assessment and plan.  Signed by: Annabelle Barrack, NP   Please contact Palliative Medicine Team phone at [phone] for questions and concerns.  For individual provider: See Tilford Foley

## 2023-11-28 NOTE — Hospital Course (Addendum)
 [name] is a 78 year old male with extensive history of recent hospitalization for sepsis, SNF placement on discharge, HTN, HLD, depression, GERD, H/O bladder cancer, restless leg syndrome, chronic lymphedema, CKD... Presenting once again to the ED with chief complaint of dysuria, and progressive generalized weaknesses.  Patient had a recent hospitalization for sepsis, due to severe debility was discharged to [name] where he was recently discharged from.  Since discharge she is reporting a progressively getting weak unable to carry his ADLs unable to ambulate.  Decreased appetite.  Reporting increased frequency of urination, burning.  Recent hospitalization for urosepsis- from 3/6- 3/22 with intubation admission to ICU  Family confirms DNR/DNI status, also previous discussion of palliative care and hospice  ED evaluation/course: Blood pressure 106/63, pulse 69, temperature 97.6 F (36.4 C), resp. rate (!) 22, SpO2 95%. Labs: WBC of 9.0, potassium 2.9, creatinine 0.39, glucose 112, magnesium 1.6, otherwise within normal limits UA reveals: Cloudy, moderate hemoglobin, leukocyte Estrace, nitrites, many bacteria, WBC>50  Potassium and magnesium were replaced in ED, 500 mL of normal saline given, with IV Rocephin

## 2023-11-28 NOTE — Assessment & Plan Note (Signed)
 Continue statins

## 2023-11-28 NOTE — Assessment & Plan Note (Signed)
-   Likely due to poor p.o. intake -Consulting dietitian, adding dietary supplements -Adding appetite stimulant Megace - Monitoring and repleting accordingly

## 2023-11-28 NOTE — Progress Notes (Signed)
   11/28/23 1601  TOC Brief Assessment  Insurance and Status Reviewed  Patient has primary care physician Yes  Home environment has been reviewed from home  Prior level of function: assistance as needed  Prior/Current Home Services No current home services  Social Drivers of Health Review SDOH reviewed no interventions necessary  Readmission risk has been reviewed Yes  Transition of care needs transition of care needs identified, TOC will continue to follow    Pt from home and Palliative APNP has met with pt and his wife for GOC discussion. Per APNP, pt requesting hospital admission to address symptoms and then dc home with [facility].   Referral sent to [facility] with request for contact to pt's wife and arrangement of hospital bed and any other DME needs.  TOC will follow.

## 2023-11-28 NOTE — Assessment & Plan Note (Signed)
-   History of hypertension - Mildly hypotensive continue with gentle IV fluid hydration -Holding home medication of Norvasc, Lasix, losartan

## 2023-11-28 NOTE — ED Provider Notes (Signed)
 [place] EMERGENCY DEPARTMENT AT [facility] Provider Note   CSN: [id number] Arrival date & time: 11/28/23  1213     History  Chief Complaint  Patient presents with   Urinary Tract Infection    Garrett Ferguson is a 78 y.o. male.  He has PMH of hypertension, GERD, bladder cancer, RSD, CKD.  Presents to the ER today accompanied by his wife and son.  They state he just got home from the [facility] in [place] last Friday and has gotten progressively more weak and he is now unable to assist in rolling in the bed, unable to walk at all, has decreased appetite.  He also reports urinary frequency and burning.   His wife states that between the patient and her son who she is a caretaker for as well, she is not able to provide care for him and needs help.  She states they have discussed hospice while patient was admitted in the hospital, she feels she needs more help than she is receiving now.  They also note he has very strong smelling urine.  He has no specific complaints otherwise.\  Patient received 500 cc of IV fluids via EMS.   Urinary Tract Infection      Home Medications Prior to Admission medications   Medication Sig Start Date End Date Taking? Authorizing Provider  acetaminophen  (TYLENOL ) 325 MG tablet Take 2 tablets (650 mg total) by mouth every 6 (six) hours as needed for mild pain (pain score 1-3) or fever. 11/03/23  Yes Ozell Blunt, MD  amLODipine  (NORVASC ) 5 MG tablet Take 1 tablet (5 mg total) by mouth daily. 11/04/23  Yes Ozell Blunt, MD  atorvastatin  (LIPITOR) 10 MG tablet Take 10 mg by mouth daily.   Yes [provider]  buPROPion  (WELLBUTRIN  XL) 150 MG 24 hr tablet Take 150 mg by mouth daily. 10/13/16  Yes [provider]  Cholecalciferol (VITAMIN D3) 1.25 MG (50000 UT) CAPS Take 1 capsule by mouth once a week. 11/24/23  Yes [provider]  feeding supplement (ENSURE ENLIVE / ENSURE PLUS) LIQD Take 237 mLs by mouth 2 (two) times daily  between meals. 11/03/23  Yes Ozell Blunt, MD  gabapentin  (NEURONTIN ) 100 MG capsule Take 1 capsule (100 mg total) by mouth 2 (two) times daily. 11/03/23  Yes Ozell Blunt, MD  losartan  (COZAAR ) 25 MG tablet Take 1 tablet (25 mg total) by mouth daily. 11/04/23  Yes Ozell Blunt, MD  naphazoline-glycerin  (CLEAR EYES REDNESS) 0.012-0.25 % SOLN Place 2 drops into the left eye 4 (four) times daily as needed for eye irritation. 03/23/23  Yes Tat, Myrtie Atkinson, MD  oxyCODONE  (OXY IR/ROXICODONE ) 5 MG immediate release tablet Take 1-2 tablets (5-10 mg total) by mouth every 6 (six) hours as needed for moderate pain (pain score 4-6) or severe pain (pain score 7-10). 11/03/23  Yes Alfonse Angle, Benuel Brazier, MD  triamcinolone cream (KENALOG) 0.1 % Apply topically 3 (three) times a week. 11/24/23  Yes [provider]  aspirin  EC 81 MG tablet Take 81 mg by mouth daily.    [provider]  pramipexole  (MIRAPEX ) 1 MG tablet TAKE 1 TABLET BY MOUTH TWICE DURING THE DAY AND ONE AND ONE-HALF TABLETS AT NIGHT 10/18/16  Yes Raquel Cables, NP  sertraline  (ZOLOFT ) 100 MG tablet Take 200 mg by mouth daily.   Yes [provider]  tamsulosin  (FLOMAX ) 0.4 MG CAPS capsule Take 0.4 mg by mouth daily.   Yes [provider]      Allergies    Sulfamethoxazole-trimethoprim, Diclofenac sodium, Latex, Levofloxacin, and Penicillins    Review of Systems   Review of Systems  Physical Exam Updated Vital Signs BP 106/63   Pulse 69   Temp 97.6 F (36.4 C)   Resp (!) 22   SpO2 95%  Physical Exam Vitals and nursing note reviewed.  Constitutional:      General: He is not in acute distress.    Appearance: He is well-developed.  HENT:     Head: Normocephalic and atraumatic.     Mouth/Throat:     Mouth: Mucous membranes are dry.  Eyes:     Conjunctiva/sclera: Conjunctivae normal.  Cardiovascular:     Rate and Rhythm: Normal rate and regular rhythm.     Heart sounds: No murmur heard. Pulmonary:      Effort: Pulmonary effort is normal. No respiratory distress.     Breath sounds: Normal breath sounds.  Abdominal:     Palpations: Abdomen is soft.     Tenderness: There is no abdominal tenderness. There is no right CVA tenderness or left CVA tenderness.  Musculoskeletal:        General: No swelling.     Cervical back: Neck supple.  Skin:    General: Skin is warm and dry.     Capillary Refill: Capillary refill takes less than 2 seconds.  Neurological:     General: No focal deficit present.     Mental Status: He is alert and oriented to person, place, and time.  Psychiatric:        Mood and Affect: Mood normal.     ED Results / Procedures / Treatments   Labs (all labs ordered are listed, but only abnormal results are displayed) Labs Reviewed  URINALYSIS, ROUTINE W REFLEX MICROSCOPIC - Abnormal; Notable for the following components:      Result Value   Color, Urine AMBER (*)    APPearance CLOUDY (*)    Hgb urine dipstick MODERATE (*)    Protein, ur 100 (*)    Nitrite POSITIVE (*)    Leukocytes,Ua MODERATE (*)    Bacteria, UA MANY (*)    Non Squamous Epithelial 0-5 (*)    All other components within normal limits  COMPREHENSIVE METABOLIC PANEL WITH GFR - Abnormal; Notable for the following components:   Potassium 2.9 (*)    Glucose, Bld 112 (*)    Creatinine, Ser 0.39 (*)    Albumin  3.1 (*)    All other components within normal limits  MAGNESIUM  - Abnormal; Notable for the following components:   Magnesium  1.6 (*)    All other components within normal limits  URINE CULTURE  EXPECTORATED SPUTUM ASSESSMENT W GRAM STAIN, RFLX TO RESP C  CULTURE, BLOOD (ROUTINE X 2)  CULTURE, BLOOD (ROUTINE X 2)  CBC  PROTIME-INR  PHOSPHORUS    EKG None  Radiology No results found.  Procedures Procedures    Medications Ordered in ED Medications  potassium chloride  SA (KLOR-CON  M) CR tablet 40 mEq (has no administration in time range)  magnesium  sulfate IVPB 2 g 50 mL (has no  administration in time range)  heparin  injection 5,000 Units (has no administration in time range)  sodium chloride  flush (NS) 0.9 % injection 3 mL (has no administration in time range)  sodium chloride  flush (NS) 0.9 % injection 3 mL (has no administration in time range)  acetaminophen  (TYLENOL ) tablet 650 mg (has no administration in time range)  Or  acetaminophen  (TYLENOL ) suppository 650 mg (has no administration in time range)  oxyCODONE  (Oxy IR/ROXICODONE ) immediate release tablet 5 mg (has no administration in time range)  HYDROmorphone  (DILAUDID ) injection 0.5-1 mg (has no administration in time range)  traZODone  (DESYREL ) tablet 25 mg (has no administration in time range)  senna-docusate (Senokot-S) tablet 1 tablet (has no administration in time range)  bisacodyl  (DULCOLAX) EC tablet 5 mg (has no administration in time range)  ondansetron  (ZOFRAN ) tablet 4 mg (has no administration in time range)    Or  ondansetron  (ZOFRAN ) injection 4 mg (has no administration in time range)  ipratropium (ATROVENT ) nebulizer solution 0.5 mg (has no administration in time range)  levalbuterol  (XOPENEX ) nebulizer solution 0.63 mg (has no administration in time range)  hydrALAZINE  (APRESOLINE ) injection 10 mg (has no administration in time range)  cefTRIAXone  (ROCEPHIN ) 2 g in sodium chloride  0.9 % 100 mL IVPB (has no administration in time range)  0.9 %  sodium chloride  infusion (has no administration in time range)    ED Course/ Medical Decision Making/ A&P                                 Medical Decision Making This patient presents to the ED for concern of generalized weakness dysuria, decreased appetite, this involves an extensive number of treatment options, and is a complaint that carries with it a high risk of complications and morbidity.  The differential diagnosis includes UTI, sepsis, ureterolithiasis, metabolic encephalopathy, electrolyte derangement, physical deconditioning,  other   Co morbidities that complicate the patient evaluation :   History of bladder cancer   Additional history obtained:  Additional history obtained from EMR External records from outside source obtained and reviewed including prior notes and labs, prior urine cultures-last urine positive urine culture grew E. coli that was pansensitive   Lab Tests:  I Ordered, and personally interpreted labs.  The pertinent results include: CBC shows no anemia or leukocytosis; CMP shows normal renal function, potassium decreased at 2.9; magnesium  low at 1.6; UTI positive nitrate, moderate leuks, greater than 50 red blood cells, greater than 50 white blood cells and many bacteria with 11-20 squamous epithelial cells    Consultations Obtained:  I requested consultation with the hospitalist Dr. Extremity,  and discussed lab and imaging findings as well as pertinent plan - they recommend: Admission   Problem List / ED Course / Critical interventions / Medication management  Patient here with worsening generalized weakness and urinary symptoms since coming home from SNF about 5 days ago, now unable to even assist his wife rolling over in the bed due to his weakness.  He has no focal weakness.  He has no decrease in his mental status.  Labs show low potassium and magnesium  which are being repleted, patient got 500 cc of IV fluids with EMS, do not feel he needs any further IV fluids bolused in the ED, discussed with hospitalist for admission.  Family open to hospice, patient is DNR.  I have reviewed the patients home medicines and have made adjustments as needed   Social Determinants of Health:  Patient lives with his spouse who is currently unable to care for him    Amount and/or Complexity of Data Reviewed Labs: ordered.  Risk Prescription drug management. Decision regarding hospitalization.           Final Clinical Impression(s) / ED Diagnoses Final diagnoses:  Acute cystitis  with  hematuria  Hypokalemia    Rx / DC Orders ED Discharge Orders     None         Joshua Nieves 11/28/23 1425    Ninetta Basket, MD 11/30/23 1553

## 2023-11-28 NOTE — Assessment & Plan Note (Addendum)
-   In remission-not under any treatment - Continue tamsulosin

## 2023-11-28 NOTE — Assessment & Plan Note (Addendum)
-   Severe debility, now unable to ambulate, needing assist with all ADLs -Recent hospitalization and placement at SNF  significant L>R LE weakness - denies numbness or saddle anesthesia - seems to be longstanding - has history of multiple surgeries to L knee and chronic pain related to that.  Weakness could be related to chronic deconditioning related to pain and lymphedema.   - Mostly wheelchair dependent at home since August 1, 202  -Consulting PT OT for evaluation -Patient likely need to be replaced to SNF - Fall precautions

## 2023-11-28 NOTE — ED Notes (Signed)
 Observed yellow/brownish discharge in brief when assisting with Urinal.

## 2023-11-28 NOTE — Assessment & Plan Note (Addendum)
 Restless leg syndrome along with anxiety and depression - Continue home medication of Neurontin and Mirapex

## 2023-11-28 NOTE — H&P (Signed)
 History and Physical   Patient: Garrett Ferguson                            PCP: [facility]                    DOB: 1945/11/22            DOA: 11/28/2023 ZOX:[id number]             DOS: 11/28/2023, 2:37 PM  [facility]  Patient coming from:   HOME  I have personally reviewed patient's medical records, in electronic medical records, including:  [place] link, and care everywhere.    Chief Complaint:   Chief Complaint  Patient presents with   Urinary Tract Infection    History of present illness:    Garrett Ferguson is a 78 year old male with extensive history of recent hospitalization for sepsis, SNF placement on discharge, HTN, HLD, depression, GERD, H/O bladder cancer, restless leg syndrome, chronic lymphedema, CKD... Presenting once again to the ED with chief complaint of dysuria, and progressive generalized weaknesses.  Patient had a recent hospitalization for sepsis, due to severe debility was discharged to [facility] where he was recently discharged from.  Since discharge she is reporting a progressively getting weak unable to carry his ADLs unable to ambulate.  Decreased appetite.  Reporting increased frequency of urination, burning.  Recent hospitalization for urosepsis- from 3/6- 3/22 with intubation admission to ICU  Family confirms DNR/DNI status, also previous discussion of palliative care and hospice  ED evaluation/course: Blood pressure 106/63, pulse 69, temperature 97.6 F (36.4 C), resp. rate (!) 22, SpO2 95%. Labs: WBC of 9.0, potassium 2.9, creatinine 0.39, glucose 112, magnesium 1.6, otherwise within normal limits UA reveals: Cloudy, moderate hemoglobin, leukocyte Estrace, nitrites, many bacteria, WBC>50  Potassium and magnesium were replaced in ED, 500 mL of normal saline given, with IV Rocephin   Patient Denies having: Fever, Chills, Cough, SOB, Chest Pain, Abd pain, N/V/D, headache, dizziness, lightheadedness,  rash, open  wounds   Review of Systems: As per HPI, otherwise 10 point review of systems were negative.   ----------------------------------------------------------------------------------------------------------------------  Allergies  Allergen Reactions   Sulfamethoxazole-Trimethoprim    Diclofenac Sodium Rash   Latex Rash   Levofloxacin Rash    Pt developed red spots on leg and itching 2-3 hours after taking po Levaquin   Penicillins Rash    Home MEDs:  Prior to Admission medications   Medication Sig Start Date End Date Taking? Authorizing Provider  acetaminophen (TYLENOL) 325 MG tablet Take 2 tablets (650 mg total) by mouth every 6 (six) hours as needed for mild pain (pain score 1-3) or fever. 11/03/23  Yes Meredeth Ide, MD  amLODipine (NORVASC) 5 MG tablet Take 1 tablet (5 mg total) by mouth daily. 11/04/23  Yes Meredeth Ide, MD  atorvastatin (LIPITOR) 10 MG tablet Take 10 mg by mouth daily.   Yes [provider]  buPROPion (WELLBUTRIN XL) 150 MG 24 hr tablet Take 150 mg by mouth daily. 10/13/16  Yes [provider]  Cholecalciferol (VITAMIN D3) 1.25 MG (50000 UT) CAPS Take 1 capsule by mouth once a week. 11/24/23  Yes [provider]  feeding supplement (ENSURE ENLIVE / ENSURE PLUS) LIQD Take 237 mLs by mouth 2 (two) times daily between meals. 11/03/23  Yes Meredeth Ide, MD  gabapentin (NEURONTIN) 100 MG capsule Take 1 capsule (100 mg  total) by mouth 2 (two) times daily. 11/03/23  Yes Ozell Blunt, MD  losartan (COZAAR) 25 MG tablet Take 1 tablet (25 mg total) by mouth daily. 11/04/23  Yes Ozell Blunt, MD  naphazoline-glycerin (CLEAR EYES REDNESS) 0.012-0.25 % SOLN Place 2 drops into the left eye 4 (four) times daily as needed for eye irritation. 03/23/23  Yes Tat, Myrtie Atkinson, MD  oxyCODONE (OXY IR/ROXICODONE) 5 MG immediate release tablet Take 1-2 tablets (5-10 mg total) by mouth every 6 (six) hours as needed for moderate pain (pain score 4-6) or severe pain (pain score  7-10). 11/03/23  Yes Alfonse Angle, Benuel Brazier, MD  pramipexole (MIRAPEX) 1 MG tablet TAKE 1 TABLET BY MOUTH TWICE DURING THE DAY AND ONE AND ONE-HALF TABLETS AT NIGHT 10/18/16  Yes Raquel Cables, NP  sertraline (ZOLOFT) 100 MG tablet Take 200 mg by mouth daily.   Yes [provider]  tamsulosin (FLOMAX) 0.4 MG CAPS capsule Take 0.4 mg by mouth daily.   Yes [provider]  triamcinolone cream (KENALOG) 0.1 % Apply topically 3 (three) times a week. 11/24/23  Yes [provider]  aspirin EC 81 MG tablet Take 81 mg by mouth daily.    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   GERD (gastroesophageal reflux disease)    H/O: rheumatic fever    age 53   Hearing loss    History of ulcer disease 04/14/1969   Hypertension    Incontinence of urine    Restless leg syndrome    Restless legs syndrome (RLS) 08/08/2013   RSD (reflex sympathetic dystrophy)    L hand    Past Surgical History:  Procedure Laterality Date   BLADDER SURGERY     transitional cell carcinoma   carpall tunnel     L hand   CATARACT EXTRACTION W/PHACO Right 02/23/2023   Procedure: CATARACT EXTRACTION PHACO AND INTRAOCULAR LENS PLACEMENT (IOC);  Surgeon: Ardeth Krabbe, MD;  Location: [facility];  Service: Ophthalmology;  Laterality: Right;  CDE 11.94   CHOLECYSTECTOMY  08/14/1977   FOOT SURGERY     x5   JOINT REPLACEMENT Right replacements x3   l knee with multiple surg on same knee   NASAL  SINUS SURGERY  04/14/1989   PROSTATE BIOPSY     TOTAL KNEE REVISION  04/23/2012   Procedure: TOTAL KNEE REVISION;  Surgeon: Neville Barbone, MD;  Location: [facility];  Service: Orthopedics;  Laterality: Left;   TRANSURETHRAL RESECTION OF PROSTATE       reports that he has never smoked. He has never used smokeless tobacco. He reports that he does not drink alcohol and does not use drugs.   Family History  Problem Relation Age of Onset   Cancer Father    Melanoma Father    Heart disease Sister    Cancer Other    Melanoma Other    High blood pressure Other     Physical Exam:   Vitals:   11/28/23 1222 11/28/23  1245  BP: (!) 143/80 106/63  Pulse: 87 69  Resp: 18 (!) 22  Temp: 97.6 F (36.4 C)   SpO2: 97% 95%   Constitutional: NAD, calm, comfortable Eyes: PERRL, lids and conjunctivae normal ENMT: Mucous membranes are moist. Posterior pharynx clear of any exudate or lesions.Normal dentition.  Neck: normal, supple, no masses, no thyromegaly Respiratory: clear to auscultation bilaterally, no wheezing, no crackles. Normal respiratory effort. No accessory muscle use.  Cardiovascular: Regular rate and rhythm, no murmurs / rubs / gallops. No extremity edema. 2+ pedal pulses. No carotid bruits.  Abdomen: no tenderness, no masses palpated. No hepatosplenomegaly. Bowel sounds positive.  Musculoskeletal: Difficulty ambulating, gait instability, can only ambulate with 2 assist  severe generalized weaknesses, especially on right due to significant knee pain and surgeries  no clubbing / cyanosis.  Limited range of motion especially in lower extremities ROM, no contractures. Normal muscle tone.  +2+ edema in lower extremities Neurologic: CN II-XII grossly intact. Sensation intact, DTR normal. Strength 5/5 in all 4.  Psychiatric: Responding to questions slowly, not engaging, depressed mood, Denying suicidal homicidal ideation Skin: no rashes, lesions, ulcers. No induration        Labs on  admission:    I have personally reviewed following labs and imaging studies  CBC: Recent Labs  Lab 11/28/23 1302  WBC 9.0  HGB 13.3  HCT 41.8  MCV 92.9  PLT 201   Basic Metabolic Panel: Recent Labs  Lab 11/28/23 1302  NA 138  K 2.9*  CL 99  CO2 25  GLUCOSE 112*  BUN 18  CREATININE 0.39*  CALCIUM 9.0  MG 1.6*   GFR: CrCl cannot be calculated (Unknown ideal weight.). Liver Function Tests: Recent Labs  Lab 11/28/23 1302  AST 18  ALT 19  ALKPHOS 113  BILITOT 0.8  PROT 7.0  ALBUMIN 3.1*   No results for input(s): "LIPASE", "AMYLASE" in the last 168 hours. No results for input(s): "AMMONIA" in the last 168 hours. Coagulation Profile: Recent Labs  Lab 11/28/23 1406  INR 1.1    Urine analysis:    Component Value Date/Time   COLORURINE AMBER (A) 11/28/2023 1230   APPEARANCEUR CLOUDY (A) 11/28/2023 1230   APPEARANCEUR Cloudy 04/05/2012 1252   LABSPEC 1.021 11/28/2023 1230   LABSPEC 1.015 04/05/2012 1252   PHURINE 5.0 11/28/2023 1230   GLUCOSEU NEGATIVE 11/28/2023 1230   GLUCOSEU Negative 04/05/2012 1252   HGBUR MODERATE (A) 11/28/2023 1230   BILIRUBINUR NEGATIVE 11/28/2023 1230   BILIRUBINUR small (A) 06/21/2022 1903   BILIRUBINUR Negative 04/05/2012 1252   KETONESUR NEGATIVE 11/28/2023 1230   PROTEINUR 100 (A) 11/28/2023 1230   UROBILINOGEN 1.0 06/21/2022 1903   UROBILINOGEN 0.2 04/19/2012 1258   NITRITE POSITIVE (A) 11/28/2023 1230   LEUKOCYTESUR MODERATE (A) 11/28/2023 1230   LEUKOCYTESUR 2+ 04/05/2012 1252    Last A1C:  Lab Results  Component Value Date   HGBA1C 5.8 (H) 10/19/2023     Radiologic Exams on Admission:   No results found.  EKG:   Independently reviewed.  Orders placed or performed during the hospital encounter of 11/28/23   EKG 12-Lead   ---------------------------------------------------------------------------------------------------------------------------------------    Assessment / Plan:   Principal  Problem:   UTI (urinary tract infection) Active Problems:   Hypotensive episode   Palliative care encounter   Hypoalbuminemia   Restless leg syndrome   Hypertension   History of bladder cancer   Lymphedema   Hyperlipidemia   Physical deconditioning   Depression   Assessment and  Plan: * UTI (urinary tract infection) - Meeting SIRS criteria, with hypotension, tachycardic-and UTI - Ruling out sepsis - Complicated recurrent UTI in the setting of history of bladder cancer -UA positive for leukocyte esterase, nitrates, many bacteria, urine WBC > 50 -Obtaining Urine and Blood cultures-will follow-up accordingly -Patient has been started on Rocephin, will continue with 2 g daily -Will monitor for urinary retention -Continue IV fluid with normal saline  Palliative care encounter - DNR/DNI confirmed -Continue consultation with palliative care to determine goals of care  Hypotensive episode - History of hypertension - Mildly hypotensive continue with gentle IV fluid hydration -Holding home medication of Norvasc, Lasix, losartan  Hypoalbuminemia - Likely due to poor p.o. intake -Consulting dietitian, adding dietary supplements -Adding appetite stimulant Megace - Monitoring and repleting accordingly   Hyperlipidemia Continue statins  Lymphedema History of chronic lymphedema Bilateral Lower Extremity Edema - Continue supportive care, wound care was consulted - recent echo with EF 60-65%, no RWMA, moderately reduced RVSF, severely dilated LA, IVC dilated with <50% resp variability - BNP mildly elevated -chronically  History of bladder cancer - In remission-not under any treatment - Continue tamsulosin  Hypertension - History of hypertension, holding home BP medication of Norvasc, Lasix, losartan-due to blood pressure running soft   Restless leg syndrome Restless leg syndrome along with anxiety and depression - Continue home medication of Neurontin and  Mirapex  Physical deconditioning - Severe debility, now unable to ambulate, needing assist with all ADLs -Recent hospitalization and placement at SNF  significant L>R LE weakness - denies numbness or saddle anesthesia - seems to be longstanding - has history of multiple surgeries to L knee and chronic pain related to that.  Weakness could be related to chronic deconditioning related to pain and lymphedema.   - Mostly wheelchair dependent at home since August 1, 202  -Consulting PT OT for evaluation -Patient likely need to be replaced to SNF - Fall precautions  Depression - History of depression/anxiety -Home medication of Wellbutrin/sertraline will be continued    Consults called: PT/OT , TOC , palliative -------------------------------------------------------------------------------------------------------------------------------------------- DVT prophylaxis:  heparin injection 5,000 Units Start: 11/28/23 2200 TED hose Start: 11/28/23 1406 SCDs Start: 11/28/23 1406   Code Status:   Code Status: Full Code   Admission status: Patient will be admitted as Observation, with a greater than 2 midnight length of stay. Level of care: Med-Surg   Family Communication:  none at bedside  (The above findings and plan of care has been discussed with patient in detail, the patient expressed understanding and agreement of above plan)  --------------------------------------------------------------------------------------------------------------------------------------------------  Disposition Plan:  Anticipated 1-2 days Status is: Observation The patient remains OBS appropriate and will d/c before 2 midnights.     -----------------------  Time spent:  37  Min.  Was spent seeing and evaluating the patient, reviewing all medical records, drawn plan of care.  SIGNED: Bobbetta Burnet, MD, FHM. FAAFP.  - [facility], Pager  (Please use [email or web address] to page/ or secure chat  through epic) If 7PM-7AM, please contact night-coverage [email or web address],  11/28/2023, 2:37 PM

## 2023-11-28 NOTE — Assessment & Plan Note (Signed)
-   History of depression/anxiety -Home medication of Wellbutrin/sertraline will be continued

## 2023-11-28 NOTE — Assessment & Plan Note (Addendum)
-   Meeting SIRS criteria, with hypotension, tachycardic-and UTI - Ruling out sepsis - Complicated recurrent UTI in the setting of history of bladder cancer -UA positive for leukocyte esterase, nitrates, many bacteria, urine WBC > 50 -Obtaining Urine and Blood cultures-will follow-up accordingly -Patient has been started on Rocephin, will continue with 2 g daily -Will monitor for urinary retention -Continue IV fluid with normal saline

## 2023-11-28 NOTE — Assessment & Plan Note (Signed)
-   History of hypertension, holding home BP medication of Norvasc, Lasix, losartan-due to blood pressure running soft

## 2023-11-28 NOTE — Assessment & Plan Note (Signed)
-   DNR/DNI confirmed -Continue consultation with palliative care to determine goals of care

## 2023-11-28 NOTE — ED Triage Notes (Signed)
 Pt from home via [facility] with reports of UTI. Pt states his home health nurse wanted him to come to the ER. Pt states he has been treated x 5 days with antibiotic for known UTI.

## 2023-11-28 NOTE — Assessment & Plan Note (Signed)
 History of chronic lymphedema Bilateral Lower Extremity Edema - Continue supportive care, wound care was consulted - recent echo with EF 60-65%, no RWMA, moderately reduced RVSF, severely dilated LA, IVC dilated with <50% resp variability - BNP mildly elevated -chronically

## 2023-11-29 DIAGNOSIS — D461 Refractory anemia with ring sideroblasts: Secondary | ICD-10-CM

## 2023-11-29 LAB — CBC
HCT: 37 % — ABNORMAL LOW (ref 39.0–52.0)
Hemoglobin: 11.9 g/dL — ABNORMAL LOW (ref 13.0–17.0)
MCH: 30 pg (ref 26.0–34.0)
MCHC: 32.2 g/dL (ref 30.0–36.0)
MCV: 93.2 fL (ref 80.0–100.0)
Platelets: 177 10*3/uL (ref 150–400)
RBC: 3.97 MIL/uL — ABNORMAL LOW (ref 4.22–5.81)
RDW: 13.4 % (ref 11.5–15.5)
WBC: 8 10*3/uL (ref 4.0–10.5)
nRBC: 0 % (ref 0.0–0.2)

## 2023-11-29 LAB — BASIC METABOLIC PANEL WITH GFR
Anion gap: 12 (ref 5–15)
BUN: 18 mg/dL (ref 8–23)
CO2: 26 mmol/L (ref 22–32)
Calcium: 9 mg/dL (ref 8.9–10.3)
Chloride: 102 mmol/L (ref 98–111)
Creatinine, Ser: 0.4 mg/dL — ABNORMAL LOW (ref 0.61–1.24)
GFR, Estimated: >60 mL/min (ref >60)
Glucose, Bld: 98 mg/dL (ref 70–99)
Potassium: 3.3 mmol/L — ABNORMAL LOW (ref 3.5–5.1)
Sodium: 140 mmol/L (ref 135–145)

## 2023-11-29 LAB — GLUCOSE, CAPILLARY: Glucose-Capillary: 105 mg/dL — ABNORMAL HIGH (ref 70–99)

## 2023-11-29 LAB — MAGNESIUM: Magnesium: 1.8 mg/dL (ref 1.7–2.4)

## 2023-11-29 MED ORDER — AMLODIPINE BESYLATE 5 MG PO TABS
5.0000 mg | ORAL_TABLET | Freq: Every day | ORAL | Status: DC
  Administered 2023-11-29: 5 mg via ORAL
  Filled 2023-11-29: qty 1

## 2023-11-29 MED ORDER — SODIUM CHLORIDE 0.9 % IV SOLN: INTRAVENOUS | Status: AC

## 2023-11-29 MED ORDER — ATORVASTATIN CALCIUM 10 MG PO TABS
10.0000 mg | ORAL_TABLET | Freq: Every day | ORAL | Status: DC
  Administered 2023-11-30: 10 mg via ORAL
  Filled 2023-11-29 (×2): qty 1

## 2023-11-29 MED ORDER — POTASSIUM CHLORIDE CRYS ER 20 MEQ PO TBCR
40.0000 meq | EXTENDED_RELEASE_TABLET | Freq: Once | ORAL | Status: AC
  Administered 2023-11-29: 40 meq via ORAL
  Filled 2023-11-29: qty 2

## 2023-11-29 NOTE — Evaluation (Signed)
 Occupational Therapy Evaluation Patient Details Name: Garrett Ferguson MRN: [id number] DOB: [birth year] Today's Date: 11/29/2023   History of Present Illness   Garrett Ferguson is a 78 year old male with extensive history of recent hospitalization for sepsis, SNF placement on discharge, HTN, HLD, depression, GERD, H/O bladder cancer, restless leg syndrome, chronic lymphedema, CKD... Presenting once again to the ED with chief complaint of dysuria, and progressive generalized weaknesses.     Patient had a recent hospitalization for sepsis, due to severe debility was discharged to [facility] where he was recently discharged from.  Since discharge she is reporting a progressively getting weak unable to carry his ADLs unable to ambulate.  Decreased appetite.  Reporting increased frequency of urination, burning.     Recent hospitalization for urosepsis- from 3/6- 3/22 with intubation admission to ICU     Family confirms DNR/DNI status, also previous discussion of palliative care and hospice (per MD)     Clinical Impressions Pt agreeable to OT and PT co-evaluation. Pt has seen a reduction in is functional ability in the last 6 to 7 weeks. Pt has been using a hoyer lift in that time and using a w/c for ambulation. Max A for bed mobility and only partial stand with max to total assist from PT at EOB. B UE generally weak with much assist for ADL's at this time and all at bed level due to poor seated balance at times. Pt left in the bed with call bell within reach. Pt will benefit from continued OT in the hospital and recommended venue below to increase strength, balance, and endurance for safe ADL's.        If plan is discharge home, recommend the following:   Two people to help with walking and/or transfers;A lot of help with bathing/dressing/bathroom;Assistance with cooking/housework;Assist for transportation;Help with stairs or ramp for entrance     Functional Status Assessment   Patient has  had a recent decline in their functional status and/or demonstrates limited ability to make significant improvements in function in a reasonable and predictable amount of time     Equipment Recommendations   None recommended by OT             Precautions/Restrictions   Precautions Precautions: Fall Recall of Precautions/Restrictions: Intact Restrictions Weight Bearing Restrictions Per Provider Order: No     Mobility Bed Mobility Overal bed mobility: Needs Assistance Bed Mobility: Supine to Sit, Sit to Supine     Supine to sit: Max assist, HOB elevated, Used rails Sit to supine: Max assist   General bed mobility comments: labored movement; much assist    Transfers                   General transfer comment: Partial sit to stand with PT while this OT replaced bed pad. Hoyer used at baseline.      Balance Overall balance assessment: Needs assistance Sitting-balance support: Bilateral upper extremity supported, Feet supported Sitting balance-Leahy Scale: Poor Sitting balance - Comments: poor to fair seated at EOB Postural control: Posterior lean                                 ADL either performed or assessed with clinical judgement   ADL Overall ADL's : Needs assistance/impaired     Grooming: Set up;Minimal assistance;Bed level   Upper Body Bathing: Minimal assistance;Moderate assistance;Bed level   Lower Body Bathing: Total assistance;Bed level  Upper Body Dressing : Minimal assistance;Moderate assistance;Bed level   Lower Body Dressing: Total assistance;Bed level   Toilet Transfer: Total assistance Toilet Transfer Details (indicate cue type and reason): brief squat from EOB with PT; hoyer used at baseline. Toileting- Clothing Manipulation and Hygiene: Total assistance;Bed level         General ADL Comments: Pt likely near what his baseline has been for a couple months.     Vision Baseline Vision/History: 1 Wears  glasses Ability to See in Adequate Light: 1 Impaired Patient Visual Report: No change from baseline Vision Assessment?: No apparent visual deficits     Perception Perception: Not tested       Praxis Praxis: Not tested       Pertinent Vitals/Pain Pain Assessment Pain Assessment: Faces Faces Pain Scale: Hurts even more Pain Location: LE during mobility Pain Descriptors / Indicators: Grimacing, Moaning Pain Intervention(s): Limited activity within patient's tolerance, Monitored during session, Repositioned     Extremity/Trunk Assessment Upper Extremity Assessment Upper Extremity Assessment: Generalized weakness;Right hand dominant (mild A/ROM deficit for shoulder flexion)   Lower Extremity Assessment Lower Extremity Assessment: Defer to PT evaluation   Cervical / Trunk Assessment Cervical / Trunk Assessment: Kyphotic   Communication Communication Communication: No apparent difficulties   Cognition Arousal: Alert Behavior During Therapy: WFL for tasks assessed/performed Cognition: No apparent impairments                               Following commands: Intact       Cueing  General Comments   Cueing Techniques: Verbal cues                 Home Living Family/patient expects to be discharged to:: Private residence Living Arrangements: Spouse/significant other;Children Available Help at Discharge: Family;Available 24 hours/day Type of Home: House Home Access: Stairs to enter [employer] of Steps: 4 Entrance Stairs-Rails: Right Home Layout: One level     Bathroom Shower/Tub: Producer, television/film/video: Handicapped height     Home Equipment: Medical illustrator (2 wheels);Grab bars - tub/shower;Wheelchair - manual;Hospital bed;Lift chair   Additional Comments: Pt reports same living history as prior.      Prior Functioning/Environment Prior Level of Function : Needs assist       Physical Assist :  ADLs (physical);Mobility (physical) Mobility (physical): Bed mobility;Transfers;Gait;Stairs ADLs (physical): Bathing;Dressing;Toileting;IADLs Mobility Comments: Pt reports he has not walked in 6 to 7 weeks. Pt has been using hoyer lift for bed to w/c transfer. w/c used without the home. ADLs Comments: Assited for all ADL's but feeding and able to do things like brush hair. Assist IADL's.    OT Problem List: Decreased strength;Decreased range of motion;Decreased activity tolerance;Impaired balance (sitting and/or standing)   OT Treatment/Interventions: Self-care/ADL training;Therapeutic exercise;Therapeutic activities;Patient/family education;DME and/or AE instruction      OT Goals(Current goals can be found in the care plan section)   Acute Rehab OT Goals Patient Stated Goal: return home OT Goal Formulation: With patient Time For Goal Achievement: 12/13/23 Potential to Achieve Goals: Fair   OT Frequency:  Min 1X/week    Co-evaluation PT/OT/SLP Co-Evaluation/Treatment: Yes Reason for Co-Treatment: To address functional/ADL transfers   OT goals addressed during session: ADL's and self-care                       End of Session Equipment Utilized During Treatment: Rolling walker (2 wheels)  Activity Tolerance:  Patient tolerated treatment well Patient left: in bed;with call bell/phone within reach  OT Visit Diagnosis: Muscle weakness (generalized) (M62.81)                Time: 1610-9604 OT Time Calculation (min): 17 min Charges:  OT General Charges $OT Visit: 1 Visit OT Evaluation $OT Eval Low Complexity: 1 Low  Quirino Kakos OT, MOT  Thurnell Floss 11/29/2023, 9:41 AM

## 2023-11-29 NOTE — Evaluation (Signed)
 Physical Therapy Evaluation Patient Details Name: Garrett Ferguson MRN: [id number] DOB: 01/16/1946 Today's Date: 11/29/2023  History of Present Illness  Garrett Ferguson is a 78 year old male with extensive history of recent hospitalization for sepsis, SNF placement on discharge, HTN, HLD, depression, GERD, H/O bladder cancer, restless leg syndrome, chronic lymphedema, CKD... Presenting once again to the ED with chief complaint of dysuria, and progressive generalized weaknesses.     Patient had a recent hospitalization for sepsis, due to severe debility was discharged to [facility] where he was recently discharged from.  Since discharge she is reporting a progressively getting weak unable to carry his ADLs unable to ambulate.  Decreased appetite.  Reporting increased frequency of urination, burning.     Recent hospitalization for urosepsis- from 3/6- 3/22 with intubation admission to ICU     Family confirms DNR/DNI status, also previous discussion of palliative care and hospice   Clinical Impression  Patient demonstrates slow labored movement for sitting up at bedside with c/o pain BLE with pressure, movement, once seated had frequent posterior leaning, unable to scoot laterally and partially stood with knees blocked at bedside.  Patient put back to bed with Max assist to reposition. Patient will benefit from continued skilled physical therapy in hospital and recommended venue below to increase strength, balance, endurance for safe ADLs and gait.          If plan is discharge home, recommend the following: A lot of help with bathing/dressing/bathroom;A lot of help with walking and/or transfers;Help with stairs or ramp for entrance;Assistance with cooking/housework   Can travel by private vehicle        Equipment Recommendations None recommended by PT  Recommendations for Other Services       Functional Status Assessment Patient has had a recent decline in their functional status and/or  demonstrates limited ability to make significant improvements in function in a reasonable and predictable amount of time     Precautions / Restrictions Precautions Precautions: Fall Recall of Precautions/Restrictions: Intact Restrictions Weight Bearing Restrictions Per Provider Order: No      Mobility  Bed Mobility Overal bed mobility: Needs Assistance Bed Mobility: Supine to Sit, Sit to Supine     Supine to sit: Max assist, HOB elevated, Used rails Sit to supine: Max assist   General bed mobility comments: slow labored movement with frequent posterior leaning once seated    Transfers Overall transfer level: Needs assistance Equipment used: 1 person hand held assist Transfers: Sit to/from Stand Sit to Stand: Max assist           General transfer comment: Max assist partial sit to stand with knees blocked    Ambulation/Gait                  Stairs            Wheelchair Mobility     Tilt Bed    Modified Rankin (Stroke Patients Only)       Balance Overall balance assessment: Needs assistance Sitting-balance support: Feet supported, No upper extremity supported Sitting balance-Leahy Scale: Poor Sitting balance - Comments: fair/poor seated at EOB Postural control: Posterior lean Standing balance support: During functional activity, No upper extremity supported Standing balance-Leahy Scale: Poor Standing balance comment: poor/zero with hand held assist, knees blocked                             Pertinent Vitals/Pain Pain Assessment Pain Assessment: Faces  Faces Pain Scale: Hurts even more Pain Location: LE during mobility Pain Descriptors / Indicators: Grimacing, Moaning, Sore Pain Intervention(s): Limited activity within patient's tolerance, Monitored during session, Repositioned    Home Living Family/patient expects to be discharged to:: Private residence Living Arrangements: Spouse/significant other;Children Available Help  at Discharge: Family;Available 24 hours/day Type of Home: House Home Access: Stairs to enter Entrance Stairs-Rails: Right Entrance Stairs-Number of Steps: 4   Home Layout: One level Home Equipment: Medical illustrator (2 wheels);Grab bars - tub/shower;Wheelchair - manual;Hospital bed;Lift chair Additional Comments: Pt reports same living history as prior.    Prior Function Prior Level of Function : Needs assist       Physical Assist : ADLs (physical);Mobility (physical) Mobility (physical): Bed mobility;Transfers;Gait;Stairs ADLs (physical): Bathing;Dressing;Toileting;IADLs Mobility Comments: Pt reports he has not walked in 6 to 7 weeks. Pt has been using hoyer lift for bed to w/c transfer. w/c used for mobility in household ADLs Comments: Assited for all ADL's but feeding and able to do things like brush hair. Assist IADL's.     Extremity/Trunk Assessment   Upper Extremity Assessment Upper Extremity Assessment: Defer to OT evaluation    Lower Extremity Assessment Lower Extremity Assessment: Generalized weakness    Cervical / Trunk Assessment Cervical / Trunk Assessment: Kyphotic  Communication   Communication Communication: No apparent difficulties Factors Affecting Communication: Hearing impaired    Cognition Arousal: Alert Behavior During Therapy: WFL for tasks assessed/performed   PT - Cognitive impairments: No apparent impairments                         Following commands: Intact       Cueing Cueing Techniques: Verbal cues     General Comments      Exercises     Assessment/Plan    PT Assessment Patient needs continued PT services  PT Problem List Decreased strength;Decreased activity tolerance;Decreased balance;Decreased mobility;Decreased range of motion       PT Treatment Interventions DME instruction;Functional mobility training;Therapeutic activities;Therapeutic exercise;Balance training;Wheelchair mobility  training;Patient/family education    PT Goals (Current goals can be found in the Care Plan section)  Acute Rehab PT Goals Patient Stated Goal: return home with family to assist PT Goal Formulation: With patient Time For Goal Achievement: 12/02/23 Potential to Achieve Goals: Good    Frequency Min 2X/week     Co-evaluation PT/OT/SLP Co-Evaluation/Treatment: Yes Reason for Co-Treatment: To address functional/ADL transfers PT goals addressed during session: Mobility/safety with mobility;Balance;Proper use of DME OT goals addressed during session: ADL's and self-care       AM-PAC PT "6 Clicks" Mobility  Outcome Measure Help needed turning from your back to your side while in a flat bed without using bedrails?: A Lot Help needed moving from lying on your back to sitting on the side of a flat bed without using bedrails?: A Lot Help needed moving to and from a bed to a chair (including a wheelchair)?: Total Help needed standing up from a chair using your arms (e.g., wheelchair or bedside chair)?: Total Help needed to walk in hospital room?: Total Help needed climbing 3-5 steps with a railing? : Total 6 Click Score: 8    End of Session   Activity Tolerance: Patient tolerated treatment well;Patient limited by fatigue;Patient limited by pain Patient left: in bed;with call bell/phone within reach Nurse Communication: Mobility status PT Visit Diagnosis: Unsteadiness on feet (R26.81);Other abnormalities of gait and mobility (R26.89);Muscle weakness (generalized) (M62.81)    Time: 1610-9604 PT  Time Calculation (min) (ACUTE ONLY): 20 min   Charges:   PT Evaluation $PT Eval Moderate Complexity: 1 Mod PT Treatments $Therapeutic Activity: 8-22 mins PT General Charges $$ ACUTE PT VISIT: 1 Visit         12:29 PM, 11/29/23 Walton Guppy, MPT Physical Therapist with [facility] [phone] office [phone] mobile phone

## 2023-11-29 NOTE — TOC Progression Note (Signed)
 Transition of Care [facility]) - Progression Note    Patient Details  Name: Garrett Ferguson MRN: [id number] Date of Birth: 09-Dec-1945  [facility] CM/SW Contact  Orelia Binet, RN Phone Number: 11/29/2023, 11:40 AM  Clinical Narrative:   CM spoke with Ova Bloomer at [facility] to confirm they have spoke with his wife to get a hospital bed. CM spoke with his wife, she is trying to get someone to help her move the old bed out. She also needs a air mattress. Samantha updated, they will delay delivery until tomorrow. TOC following.     Expected Discharge Plan: Home w Hospice Care Barriers to Discharge: Continued Medical Work up  Expected Discharge Plan and Services         [facility] Agency: [facility] Date [facility] Agency Contacted: 11/29/23 Time HH Agency Contacted: 1036 Representative spoke with at [facility] Agency: Ova Bloomer   Social Determinants of Health (SDOH) Interventions SDOH Screenings   Food Insecurity: No Food Insecurity (11/28/2023)  Housing: Low Risk  (11/28/2023)  Transportation Needs: No Transportation Needs (11/28/2023)  Utilities: Not At Risk (11/28/2023)  Financial Resource Strain: Medium Risk (04/06/2021)   Received from [facility], [facility]  Physical Activity: Insufficiently Active (02/23/2021)   Received from [facility], [facility]  Social Connections: Moderately Integrated (11/28/2023)  Stress: No Stress Concern Present (02/23/2021)   Received from [facility], [facility]  Tobacco Use: Low Risk  (11/28/2023)  Health Literacy: Low Risk  (02/23/2021)   Received from [facility], [facility]    Readmission Risk Interventions    10/22/2023    1:09 PM  Readmission Risk Prevention Plan  Transportation Screening Complete  PCP or Specialist Appt within 5-7 Days Complete  Home Care Screening Complete  Medication Review (RN CM) Complete

## 2023-11-29 NOTE — Plan of Care (Signed)

## 2023-11-29 NOTE — Progress Notes (Signed)
 PROGRESS NOTE    Patient: Garrett Ferguson                            PCP: [facility]                    DOB: [birth year]            DOA: 11/28/2023 ZOX:[id number]             DOS: 11/29/2023, 11:29 AM   LOS: 1 day   Date of Service: The patient was seen and examined on 11/29/2023  Subjective:   The patient was seen and examined this morning, stable no acute distress Laying in bed comfortably, elevated BP 177/82. Denies any headaches or chest pain or shortness of breath  Brief Narrative:   Garrett Ferguson is a 78 year old male with extensive history of recent hospitalization for sepsis, SNF placement on discharge, HTN, HLD, depression, GERD, H/O bladder cancer, restless leg syndrome, chronic lymphedema, CKD... Presenting once again to the ED with chief complaint of dysuria, and progressive generalized weaknesses.  Patient had a recent hospitalization for sepsis, due to severe debility was discharged to [facility] where he was recently discharged from.  Since discharge she is reporting a progressively getting weak unable to carry his ADLs unable to ambulate.  Decreased appetite.  Reporting increased frequency of urination, burning.  Recent hospitalization for urosepsis- from 3/6- 3/22 with intubation admission to ICU  Family confirms DNR/DNI status, also previous discussion of palliative care and hospice  ED evaluation/course: Blood pressure 106/63, pulse 69, temperature 97.6 F (36.4 C), resp. rate (!) 22, SpO2 95%. Labs: WBC of 9.0, potassium 2.9, creatinine 0.39, glucose 112, magnesium 1.6, otherwise within normal limits UA reveals: Cloudy, moderate hemoglobin, leukocyte Estrace, nitrites, many bacteria, WBC>50  Potassium and magnesium were replaced in ED, 500 mL of normal saline given, with IV Rocephin      Assessment & Plan:   Principal Problem:   IRSA (idiopathic refractory sideroblastic anemia) (HCC) Active Problems:   Hypotensive episode   Palliative  care encounter   UTI (urinary tract infection)   Hypoalbuminemia   Restless leg syndrome   Hypertension   History of bladder cancer   Lymphedema   Hyperlipidemia   Physical deconditioning   Depression   SIRS (systemic inflammatory response syndrome) (HCC)     Assessment and Plan: UTI (urinary tract infection) - Hemodynamically stable this a.m.  - WJX:BJYNWGN SIRS criteria, with hypotension, tachycardic-and UTI - Sepsis was ruled out, lactic acid 1.1, - Complicated recurrent UTI in the setting of history of bladder cancer -POA: UA positive for leukocyte esterase, nitrates, many bacteria, urine WBC > 50 -Obtaining Urine and Blood cultures-will follow-up accordingly  - Continue IV antibiotic Rocephin 4/16 >>  -Will monitor for urinary retention - S/p gentle IV fluid   Palliative care encounter - DNR/DNI confirmed -Continue discussion regarding goals of care - Palliative care team following - Upon discharge to Home via EMS with the benefits of [facility] hospice care   Hypotensive episode-history of hypertension -Blood pressure improving, elevated -Restarting home medication of Norvasc, Lasix, losartan judiciously   Hypoalbuminemia - Likely due to poor p.o. intake -Consulting dietitian, adding dietary supplements -Adding appetite stimulant Megace - Replace with 50 g X 1    Hyperlipidemia- Continue statins  Lymphedema History of chronic lymphedema Bilateral Lower Extremity Edema - Continue supportive care, wound care was consulted - recent echo with  EF 60-65%, no RWMA, moderately reduced RVSF, severely dilated LA, IVC dilated with <50% resp variability - BNP mildly elevated -chronically  History of bladder cancer - In remission-not under any treatment - Continue tamsulosin  Hypertension -home BP medication of Norvasc, Lasix, losartan-due to blood pressure running soft-now elevated, resuming judiciously   Restless leg syndrome-with anxiety and depression -  Continue home medication of Neurontin and Mirapex  Physical deconditioning - Severe debility, now unable to ambulate, needing assist with all ADLs -Recent hospitalization and placement at SNF  significant L>R LE weakness - denies numbness or saddle anesthesia - seems to be longstanding - has history of multiple surgeries to L knee and chronic pain related to that.  Weakness could be related to chronic deconditioning related to pain and lymphedema.   - Mostly wheelchair dependent at home since August 1, 202  -Consulting PT OT for evaluation -?  SNF-with palliative care - Fall precautions  Depression/anxiety - Wellbutrin/sertraline will be continued      ----------------------------------------------------------------------------------------------------------------------------------------------- Nutritional status:  The patient's BMI is: Body mass index is 28.04 kg/m. I agree with the assessment and plan as outlined b    ---------------------------------------------------------------------------------------------------------------------------------------------------- Cultures; Blood Cultures x 2 >>  Urine Culture  >>>   IV antibiotics Rocephin 4-16- 25>>>    ------------------------------------------------------------------------------------------------------------------------------------------------  DVT prophylaxis:  heparin injection 5,000 Units Start: 11/28/23 2200 TED hose Start: 11/28/23 1406 SCDs Start: 11/28/23 1406   Code Status:   Code Status: Limited: Do not attempt resuscitation (DNR) -DNR-LIMITED -Do Not Intubate/DNI   Family Communication: No family member present at bedside- attempt will be made to update daily The above findings and plan of care has been discussed with patient (and family)  in detail,  they expressed understanding and agreement of above. -Advance care planning has been discussed.   Admission status:   Status is: Inpatient Remains  inpatient appropriate because: Needing IV antibiotics, currently total care, needing assist with all ADLs   Disposition: From  - home             Planning for discharge in likely home with hospice referral in 1-2 days  Procedures:   No admission procedures for hospital encounter.   Antimicrobials:  Anti-infectives (From admission, onward)    Start     Dose/Rate Route Frequency Ordered Stop   11/28/23 1430  cefTRIAXone (ROCEPHIN) 2 g in sodium chloride 0.9 % 100 mL IVPB        2 g 200 mL/hr over 30 Minutes Intravenous Every 24 hours 11/28/23 1407     11/28/23 1400  cefTRIAXone (ROCEPHIN) 1 g in sodium chloride 0.9 % 100 mL IVPB  Status:  Discontinued        1 g 200 mL/hr over 30 Minutes Intravenous  Once 11/28/23 1345 11/28/23 1407        Medication:   amLODipine  5 mg Oral Daily   aspirin EC  81 mg Oral Daily   [START ON 11/30/2023] atorvastatin  10 mg Oral Daily   buPROPion  150 mg Oral Daily   feeding supplement  237 mL Oral BID BM   gabapentin  100 mg Oral BID   heparin  5,000 Units Subcutaneous Q8H   lactobacillus  1 g Oral TID WC   pramipexole  0.25 mg Oral TID   sertraline  200 mg Oral Daily   sodium chloride flush  3 mL Intravenous Q12H   sodium chloride flush  3 mL Intravenous Q12H   tamsulosin  0.4 mg Oral Daily   [  START ON 12/05/2023] Vitamin D (Ergocalciferol)  50,000 Units Oral Q7 days    acetaminophen **OR** acetaminophen, bisacodyl, hydrALAZINE, HYDROmorphone (DILAUDID) injection, ipratropium, levalbuterol, ondansetron **OR** ondansetron (ZOFRAN) IV, mouth rinse, oxyCODONE, senna-docusate, traZODone   Objective:   Vitals:   11/28/23 2351 11/28/23 2353 11/29/23 0530 11/29/23 1112  BP: (!) 160/71  (!) 177/85 (!) 177/82  Pulse: 62  79 86  Resp: 18  18 16   Temp: 97.7 F (36.5 C)  98 F (36.7 C) 97.7 F (36.5 C)  TempSrc: Oral  Oral Oral  SpO2:   98% 98%  Weight:  91.2 kg    Height:  5\' 11"  (1.803 m)      Intake/Output Summary (Last 24 hours) at  11/29/2023 1129 Last data filed at 11/29/2023 1610 Gross per 24 hour  Intake 960 ml  Output --  Net 960 ml   Filed Weights   11/28/23 2353  Weight: 91.2 kg     Physical examination:       General:  AAO x 2,  cooperative, no distress; pleasantly confused  HEENT:  Normocephalic, PERRL, otherwise with in Normal limits   Neuro:  CNII-XII intact. , normal motor and sensation, reflexes intact   Lungs:   Clear to auscultation BL, Respirations unlabored,  No wheezes / crackles  Cardio:    S1/S2, RRR, No murmure, No Rubs or Gallops   Abdomen:  Soft, non-tender, bowel sounds active all four quadrants, no guarding or peritoneal signs.  Muscular  skeletal:  Limited exam -severe global generalized weaknesses - Moving upper extremities, minimal lower extremity movement bilateral Unable to ambulate without assist 2+ pulses,  symmetric, No pitting edema  Skin:  Dry, warm to touch, negative for any Rashes,  Wounds: Please see nursing documentation         ------------------------------------------------------------------------------------------------------------------------------------------    LABs:     Latest Ref Rng & Units 11/29/2023    2:09 AM 11/28/2023    1:02 PM 11/02/2023    3:45 AM  CBC  WBC 4.0 - 10.5 K/uL 8.0  9.0  8.0   Hemoglobin 13.0 - 17.0 g/dL 96.0  45.4  09.8   Hematocrit 39.0 - 52.0 % 37.0  41.8  35.2   Platelets 150 - 400 K/uL 177  201  587       Latest Ref Rng & Units 11/29/2023    2:09 AM 11/28/2023    1:02 PM 11/02/2023    3:45 AM  CMP  Glucose 70 - 99 mg/dL 98  119  147   BUN 8 - 23 mg/dL 18  18  21    Creatinine 0.61 - 1.24 mg/dL 8.29  5.62  1.30   Sodium 135 - 145 mmol/L 140  138  137   Potassium 3.5 - 5.1 mmol/L 3.3  2.9  3.6   Chloride 98 - 111 mmol/L 102  99  105   CO2 22 - 32 mmol/L 26  25  26    Calcium 8.9 - 10.3 mg/dL 9.0  9.0  8.3   Total Protein 6.5 - 8.1 g/dL  7.0  6.4   Total Bilirubin 0.0 - 1.2 mg/dL  0.8  0.5   Alkaline Phos 38 - 126  U/L  113  148   AST 15 - 41 U/L  18  29   ALT 0 - 44 U/L  19  41        Micro Results Recent Results (from the past 240 hours)  Urine Culture (for pregnant, neutropenic or urologic patients  or patients with an indwelling urinary catheter)     Status: Abnormal (Preliminary result)   Collection Time: 11/28/23 12:30 PM   Specimen: Urine, Clean Catch  Result Value Ref Range Status   Specimen Description   Final    URINE, CLEAN CATCH Performed at [facility], [address], [place], [place] [address]    Special Requests   Final    NONE Performed at [facility], [address], [place], [place] [address]    Culture (A)  Final    >=100,000 COLONIES/mL KLEBSIELLA PNEUMONIAE SUSCEPTIBILITIES TO FOLLOW Performed at [facility], [address] [place], [place] [address]    Report Status PENDING  Incomplete  Culture, blood (Routine X 2) w Reflex to ID Panel     Status: None (Preliminary result)   Collection Time: 11/28/23  2:55 PM   Specimen: BLOOD  Result Value Ref Range Status   Specimen Description BLOOD BLOOD LEFT ARM  Final   Special Requests   Final    BOTTLES DRAWN AEROBIC AND ANAEROBIC Blood Culture adequate volume   Culture   Final    NO GROWTH < 24 HOURS Performed at [facility], [address], [place], [place] [address]    Report Status PENDING  Incomplete  Culture, blood (Routine X 2) w Reflex to ID Panel     Status: None (Preliminary result)   Collection Time: 11/28/23  2:55 PM   Specimen: BLOOD  Result Value Ref Range Status   Specimen Description BLOOD BLOOD LEFT HAND  Final   Special Requests   Final    Blood Culture results may not be optimal due to an inadequate volume of blood received in culture bottles BOTTLES DRAWN AEROBIC AND ANAEROBIC   Culture   Final    NO GROWTH < 24 HOURS Performed at [facility], [address] [place], [place] [address]    Report Status PENDING  Incomplete    Radiology Reports No results found.  SIGNED: Bobbetta Burnet, MD, FHM. FAAFP. Arlin Benes - [facility] hospitalist Time spent - 55 min.  In seeing, evaluating and examining the patient. Reviewing medical records, labs, drawn plan of care. [facility],  Pager (please use [email or web address] to page/ text) Please use Epic Secure Chat for non-urgent communication (7AM-7PM)  If 7PM-7AM, please contact night-coverage [email or web address], 11/29/2023, 11:29 AM

## 2023-11-29 NOTE — Plan of Care (Signed)
  Problem: Acute Rehab OT Goals (only OT should resolve) Goal: Pt. Will Perform Grooming Flowsheets (Taken 11/29/2023 0943) Pt Will Perform Grooming: with modified independence Goal: Pt. Will Perform Upper Body Bathing Flowsheets (Taken 11/29/2023 0943) Pt Will Perform Upper Body Bathing:  with supervision  sitting  with contact guard assist Goal: Pt. Will Perform Upper Body Dressing Flowsheets (Taken 11/29/2023 0943) Pt Will Perform Upper Body Dressing:  with supervision  with contact guard assist  sitting Goal: Pt/Caregiver Will Perform Home Exercise Program Flowsheets (Taken 11/29/2023 [phone]) Pt/caregiver will Perform Home Exercise Program:  Increased ROM  Increased strength  Both right and left upper extremity  Independently  Lenord Fralix OT, MOT

## 2023-11-29 NOTE — Plan of Care (Signed)
  Problem: Acute Rehab PT Goals(only PT should resolve) Goal: Pt will Roll Supine to Side Outcome: Progressing Goal: Pt Will Go Supine/Side To Sit Outcome: Progressing Flowsheets (Taken 11/29/2023 1230) Pt will go Supine/Side to Sit:  with minimal assist  with moderate assist Goal: Pt Will Go Sit To Supine/Side Outcome: Progressing Flowsheets (Taken 11/29/2023 1230) Pt will go Sit to Supine/Side:  with minimal assist  with moderate assist Goal: Patient Will Perform Sitting Balance Outcome: Progressing Flowsheets (Taken 11/29/2023 1230) Patient will perform sitting balance:  with contact guard assist  with minimal assist Goal: Patient Will Transfer Sit To/From Stand Outcome: Progressing Flowsheets (Taken 11/29/2023 1230) Patient will transfer sit to/from stand:  with moderate assist  with maximum assist Goal: Pt Will Perform Standing Balance Or Pre-Gait Outcome: Progressing Flowsheets (Taken 11/29/2023 1230) Pt will perform standing balance or pre-gait:  with moderate assist  with maximum assist   12:31 PM, 11/29/23 Walton Guppy, MPT Physical Therapist with [facility] [phone] office [phone] mobile phone

## 2023-11-30 ENCOUNTER — Other Ambulatory Visit: Payer: Self-pay

## 2023-11-30 DIAGNOSIS — D461 Refractory anemia with ring sideroblasts: Secondary | ICD-10-CM | POA: Diagnosis not present

## 2023-11-30 LAB — BASIC METABOLIC PANEL WITH GFR
Anion gap: 7 (ref 5–15)
BUN: 18 mg/dL (ref 8–23)
CO2: 25 mmol/L (ref 22–32)
Calcium: 8.6 mg/dL — ABNORMAL LOW (ref 8.9–10.3)
Chloride: 103 mmol/L (ref 98–111)
Creatinine, Ser: 0.44 mg/dL — ABNORMAL LOW (ref 0.61–1.24)
GFR, Estimated: >60 mL/min (ref >60)
Glucose, Bld: 92 mg/dL (ref 70–99)
Potassium: 3.3 mmol/L — ABNORMAL LOW (ref 3.5–5.1)
Sodium: 135 mmol/L (ref 135–145)

## 2023-11-30 LAB — GLUCOSE, CAPILLARY: Glucose-Capillary: 95 mg/dL (ref 70–99)

## 2023-11-30 LAB — URINE CULTURE: Culture: >=100000 — AB

## 2023-11-30 LAB — CBC
HCT: 37.7 % — ABNORMAL LOW (ref 39.0–52.0)
Hemoglobin: 11.7 g/dL — ABNORMAL LOW (ref 13.0–17.0)
MCH: 29 pg (ref 26.0–34.0)
MCHC: 31 g/dL (ref 30.0–36.0)
MCV: 93.5 fL (ref 80.0–100.0)
Platelets: 188 10*3/uL (ref 150–400)
RBC: 4.03 MIL/uL — ABNORMAL LOW (ref 4.22–5.81)
RDW: 13.3 % (ref 11.5–15.5)
WBC: 6.9 10*3/uL (ref 4.0–10.5)
nRBC: 0 % (ref 0.0–0.2)

## 2023-11-30 MED ORDER — SODIUM CHLORIDE 0.9% FLUSH
10.0000 mL | Freq: Two times a day (BID) | INTRAVENOUS | Status: DC
  Administered 2023-11-30 – 2023-12-01 (×2): 10 mL

## 2023-11-30 MED ORDER — SODIUM CHLORIDE 0.9% FLUSH: 10.0000 mL | INTRAVENOUS | Status: DC | PRN

## 2023-11-30 MED ORDER — SODIUM CHLORIDE 0.9 % IV SOLN
1.0000 g | INTRAVENOUS | Status: DC
  Administered 2023-11-30 – 2023-12-01 (×2): 1 g via INTRAVENOUS
  Filled 2023-11-30 (×3): qty 1000

## 2023-11-30 MED ORDER — MAGNESIUM SULFATE 2 GM/50ML IV SOLN
2.0000 g | Freq: Once | INTRAVENOUS | Status: AC
  Administered 2023-11-30: 2 g via INTRAVENOUS
  Filled 2023-11-30: qty 50

## 2023-11-30 MED ORDER — METOPROLOL SUCCINATE ER 25 MG PO TB24
25.0000 mg | ORAL_TABLET | Freq: Every day | ORAL | Status: DC
  Administered 2023-11-30: 25 mg via ORAL
  Filled 2023-11-30 (×2): qty 1

## 2023-11-30 MED ORDER — AMLODIPINE BESYLATE 5 MG PO TABS
10.0000 mg | ORAL_TABLET | Freq: Every day | ORAL | Status: DC
  Administered 2023-11-30 – 2023-12-01 (×2): 10 mg via ORAL
  Filled 2023-11-30 (×2): qty 2

## 2023-11-30 MED ORDER — POTASSIUM CHLORIDE CRYS ER 20 MEQ PO TBCR
40.0000 meq | EXTENDED_RELEASE_TABLET | Freq: Once | ORAL | Status: AC
  Administered 2023-11-30: 40 meq via ORAL
  Filled 2023-11-30: qty 2

## 2023-11-30 MED ORDER — TRAZODONE HCL 50 MG PO TABS
50.0000 mg | ORAL_TABLET | Freq: Every evening | ORAL | Status: DC | PRN
  Administered 2023-11-30: 50 mg via ORAL
  Filled 2023-11-30: qty 1

## 2023-11-30 NOTE — Plan of Care (Signed)

## 2023-11-30 NOTE — Progress Notes (Signed)
 Spoke with Rosevelt Constable, RN for this pt. Was informed this pt is not discharging today (4/18). PICC/midline to be placed 12/01/23

## 2023-11-30 NOTE — Progress Notes (Signed)

## 2023-11-30 NOTE — Consult Note (Signed)
 [facility]                                                                                        Patient Identification: Patient Name: Garrett Ferguson MRN: [id number] Admit Date: 11/28/2023 12:14 PM Today's Date: 11/30/2023 Reason for consult: ESBL UTI Requesting provider: Dr Eilene Grater  Principal Problem:   IRSA (idiopathic refractory sideroblastic anemia) (HCC) Active Problems:   Restless leg syndrome   Hypertension   Hypotensive episode   History of bladder cancer   Lymphedema   Hypoalbuminemia   Palliative care encounter   Physical deconditioning   UTI (urinary tract infection)   Hyperlipidemia   Depression   SIRS (systemic inflammatory response syndrome) (HCC)   Antibiotics:  Ceftriaxone  4/16-  Lines/Hardware:  Assessment # Probable acute cystitis in a male  - no concerns for pyelo, not bacteremic - unclear if GU symptoms are new or chronic   # ESBL Kleb pneumo infection   # Penicillin allergy - seems to have tolerated ceftriaxone  OK and will start ertapenem   # Superficial thrombosis of left basilic vein - warm compression, elevation of limb  Recommendations  Start ertapenem  1g IV daily for 7 days. EOT 12/06/23 OK to place midline Monitor CBC and CMP on antibiotics Contact precautions  ID will so, recall back with questions or concerns  D/w primary team    OPAT Diagnosis: acute cystitis   Culture Result: ESBL Kleb pneumoniae  Allergies  Allergen Reactions   Sulfamethoxazole-Trimethoprim    Diclofenac Sodium Rash   Latex Rash   Levofloxacin Rash    Pt developed red spots on leg and itching 2-3 hours after taking po Levaquin   Penicillins Rash    OPAT Orders Discharge antibiotics to be given via PICC line Discharge antibiotics: ertapenem  1g iv daily  Per pharmacy protocol  Aim for Vancomycin  trough 15-20 or AUC 400-550 (unless otherwise  indicated) Duration: 7 days  End Date: 12/06/23  [facility] Care Per Protocol:  Home health RN for IV administration and teaching; PICC line care and labs.    Labs weekly while on IV antibiotics: X__ CBC with differential __ BMP X__ CMP __ CRP __ ESR __ Vancomycin  trough __ CK  X__ Please pull PIC at completion of IV antibiotics __ Please leave PIC in place until doctor has seen patient or been notified  Fax weekly labs to [phone]  Clinic Follow Up Appt: 4/21 at 3: 45 pm   Rest of the management as per the primary team. Please call with questions or concerns.  Thank you for the consult  __________________________________________________________________________________________________________ HPI and Hospital Course: 52 Y O male with h/o bladder ca s/p TURP on remission, GERD, CKD, h/o rheumatic fever, HTN, HLD, Depression,  RLS, prior left total knee revision, chronic lymphedema who presented to ED 4/16 with increased urinary frequency and dysuria including progressive weakness with inability to walk and decreased appetite. Wife unable to care for him at home. Was given IVF by EMS. No reported fevers, chills, cough, sob, chest pain, nausea, vomiting, diarrhea, headache, dizziness.  Recent admission 3/6-3/22 for sepsis 2/2 PNA vs cellulitis as wellas ? UTI  requiring IC stay and intubation  At ED afebrile Lab work remarkable for k 2.9, cr 0.39 UA with cloudy, moderate leukocytes, positive nitrite, many bacteria and > 50 WBCs. Urine cx with ESBL Kleb pneumo.  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   GERD (gastroesophageal reflux disease)    H/O: rheumatic fever    age 78   Hearing loss    History of ulcer disease 04/14/1969   Hypertension    Incontinence of urine    Restless leg syndrome    Restless legs syndrome (RLS) 08/08/2013   RSD (reflex sympathetic dystrophy)    L hand   Past Surgical History:  Procedure Laterality Date   BLADDER SURGERY     transitional cell carcinoma   carpall tunnel     L hand   CATARACT EXTRACTION W/PHACO Right 02/23/2023   Procedure: CATARACT EXTRACTION PHACO AND INTRAOCULAR LENS PLACEMENT (IOC);  Surgeon: Ardeth Krabbe, MD;  Location: [facility];  Service: Ophthalmology;  Laterality: Right;  CDE 11.94   CHOLECYSTECTOMY  08/14/1977   FOOT SURGERY     x5   JOINT REPLACEMENT Right replacements x3   l knee with multiple surg on same knee   NASAL SINUS SURGERY  04/14/1989   PROSTATE BIOPSY     TOTAL KNEE REVISION  04/23/2012   Procedure: TOTAL KNEE REVISION;  Surgeon: Neville Barbone, MD;  Location: [facility];  Service: Orthopedics;  Laterality: Left;   TRANSURETHRAL RESECTION OF PROSTATE     Scheduled Meds:  amLODipine   10 mg Oral Daily   aspirin  EC  81 mg Oral Daily   atorvastatin   10 mg Oral Daily   buPROPion   150 mg Oral Daily   feeding supplement  237 mL Oral BID BM   gabapentin   100 mg Oral BID   heparin   5,000 Units Subcutaneous Q8H   lactobacillus  1 g Oral TID WC   metoprolol  succinate  25 mg Oral Daily   pramipexole   0.25 mg Oral TID   sertraline   200 mg Oral Daily   sodium chloride  flush  3 mL Intravenous Q12H   sodium chloride  flush  3 mL Intravenous Q12H   tamsulosin   0.4 mg Oral Daily   [START ON 12/05/2023] Vitamin D  (Ergocalciferol )  50,000 Units Oral Q7 days   Continuous Infusions: PRN Meds:.acetaminophen  **OR** acetaminophen , bisacodyl , hydrALAZINE , HYDROmorphone  (DILAUDID ) injection, ipratropium, levalbuterol , ondansetron  **OR** ondansetron  (ZOFRAN ) IV, mouth rinse, oxyCODONE , senna-docusate,  traZODone   Allergies  Allergen Reactions   Sulfamethoxazole-Trimethoprim    Diclofenac Sodium Rash   Latex Rash   Levofloxacin Rash    Pt developed red spots on leg and itching 2-3 hours after taking po Levaquin   Penicillins Rash   Social History   Socioeconomic History   Marital status: Married    Spouse name: [name]   Number of children: 1   Years of education: college   Highest education level: Not on file  Occupational History    Employer: [employer]  Tobacco Use  Smoking status: Never   Smokeless tobacco: Never  Vaping Use   Vaping status: Never Used  Substance and Sexual Activity   Alcohol use: No   Drug use: No   Sexual activity: Not on file  Other Topics Concern   Not on file  Social History Narrative   Patient is married and lives with his wife([name]).   Patient has a degree from a divinity school.   Patient is a retired Optician, dispensing.   Patient has one child.   Patient drinks some tea and diet soda occasionally.   Patient is right handed.   Social Drivers of Health   Financial Resource Strain: Medium Risk (04/06/2021)   Received [facility], [facility]   Overall Financial Resource Strain (CARDIA)    Difficulty of Paying Living Expenses: Somewhat hard  Food Insecurity: No Food Insecurity (11/28/2023)   Hunger Vital Sign    Worried About Running Out of Food in the Last Year: Never true    Ran Out of Food in the Last Year: Never true  Transportation Needs: No Transportation Needs (11/28/2023)   PRAPARE - Administrator, Civil Service (Medical): No    Lack of Transportation (Non-Medical): No  Physical Activity: Insufficiently Active (02/23/2021)   Received from [facility], [facility]   Exercise Vital Sign    Days of Exercise per Week: 5 days    Minutes of Exercise per Session: 10 min  Stress: No Stress Concern Present (02/23/2021)   Received from [facility], [facility]  Occupational Health - Occupational Stress Questionnaire    Feeling of Stress : Not at all  Social Connections: Moderately Integrated (11/28/2023)   Social Connection and Isolation Panel [NHANES]    Frequency of Communication with Friends and Family: More than three times a week    Frequency of Social Gatherings with Friends and Family: Three times a week    Attends Religious Services: More than 4 times per year    Active Member of Clubs or Organizations: No    Attends Banker Meetings: Never    Marital Status: Married  Catering manager Violence: Not At Risk (11/28/2023)   Humiliation, Afraid, Rape, and Kick questionnaire    Fear of Current or Ex-Partner: No    Emotionally Abused: No    Physically Abused: No    Sexually Abused: No   Family History  Problem Relation Age of Onset   Cancer Father    Melanoma Father    Heart disease Sister    Cancer Other    Melanoma Other    High blood pressure Other    Vitals BP (!) 164/85 (BP Location: Left Arm)   Pulse 79   Temp 98 F (36.7 C)   Resp 16   Ht 5\' 11"  (1.803 m)   Wt 91.2 kg   SpO2 96%   BMI 28.04 kg/m   Physical exam No exam as chart review only  Pertinent Microbiology Results for orders placed or performed during the hospital encounter of 11/28/23  Urine Culture (for pregnant, neutropenic or urologic patients or patients with an indwelling urinary catheter)     Status: Abnormal   Collection Time: 11/28/23 12:30 PM   Specimen: Urine, Clean Catch  Result Value Ref Range Status   Specimen Description   Final    URINE, CLEAN CATCH Performed at [facility], [address] [place], [place] [address]    Special Requests  Final    NONE Performed at [facility], [address], [place], [place] [address]    Culture (A)  Final    >=100,000 COLONIES/mL KLEBSIELLA PNEUMONIAE Confirmed Extended Spectrum Beta-Lactamase Producer (ESBL).  In bloodstream infections from ESBL organisms, carbapenems are preferred over  piperacillin/tazobactam. They are shown to have a lower risk of mortality.    Report Status 11/30/2023 FINAL  Final   Organism ID, Bacteria KLEBSIELLA PNEUMONIAE (A)  Final      Susceptibility   Klebsiella pneumoniae - MIC*    AMPICILLIN >=32 RESISTANT Resistant     CEFAZOLIN >=64 RESISTANT Resistant     CEFEPIME  8 INTERMEDIATE Intermediate     CEFTRIAXONE  >=64 RESISTANT Resistant     CIPROFLOXACIN  >=4 RESISTANT Resistant     GENTAMICIN >=16 RESISTANT Resistant     IMIPENEM 0.5 SENSITIVE Sensitive     NITROFURANTOIN  >=512 RESISTANT Resistant     TRIMETH/SULFA >=320 RESISTANT Resistant     AMPICILLIN/SULBACTAM >=32 RESISTANT Resistant     PIP/TAZO 16 SENSITIVE Sensitive ug/mL    * >=100,000 COLONIES/mL KLEBSIELLA PNEUMONIAE  Culture, blood (Routine X 2) w Reflex to ID Panel     Status: None (Preliminary result)   Collection Time: 11/28/23  2:55 PM   Specimen: BLOOD  Result Value Ref Range Status   Specimen Description BLOOD BLOOD LEFT ARM  Final   Special Requests   Final    BOTTLES DRAWN AEROBIC AND ANAEROBIC Blood Culture adequate volume   Culture   Final    NO GROWTH 2 DAYS Performed at [facility], [address], [place], [place] [address]    Report Status PENDING  Incomplete  Culture, blood (Routine X 2) w Reflex to ID Panel     Status: None (Preliminary result)   Collection Time: 11/28/23  2:55 PM   Specimen: BLOOD  Result Value Ref Range Status   Specimen Description BLOOD BLOOD LEFT HAND  Final   Special Requests   Final    Blood Culture results may not be optimal due to an inadequate volume of blood received in culture bottles BOTTLES DRAWN AEROBIC AND ANAEROBIC   Culture   Final    NO GROWTH 2 DAYS Performed at [facility], [address] [place], [place] [address]    Report Status PENDING  Incomplete   Pertinent Lab seen by me:    Latest Ref Rng & Units 11/30/2023    4:17 AM 11/29/2023    2:09 AM 11/28/2023    1:02 PM  CBC  WBC 4.0 - 10.5 K/uL 6.9  8.0  9.0    Hemoglobin 13.0 - 17.0 g/dL 91.4  78.2  95.6   Hematocrit 39.0 - 52.0 % 37.7  37.0  41.8   Platelets 150 - 400 K/uL 188  177  201       Latest Ref Rng & Units 11/30/2023    4:17 AM 11/29/2023    2:09 AM 11/28/2023    1:02 PM  CMP  Glucose 70 - 99 mg/dL 92  98  213   BUN 8 - 23 mg/dL 18  18  18    Creatinine 0.61 - 1.24 mg/dL 0.86  5.78  4.69   Sodium 135 - 145 mmol/L 135  140  138   Potassium 3.5 - 5.1 mmol/L 3.3  3.3  2.9   Chloride 98 - 111 mmol/L 103  102  99   CO2 22 - 32 mmol/L 25  26  25    Calcium  8.9 - 10.3 mg/dL  8.6  9.0  9.0   Total Protein 6.5 - 8.1 g/dL   7.0   Total Bilirubin 0.0 - 1.2 mg/dL   0.8   Alkaline Phos 38 - 126 U/L   113   AST 15 - 41 U/L   18   ALT 0 - 44 U/L   19      Pertinent Imagings/Other Imagings Plain films and CT images have been personally visualized and interpreted; radiology reports have been reviewed. Decision making incorporated into the Impression / Recommendations.  No results found.  I have personally spent 85 minutes involved in face-to-face and non-face-to-face activities for this patient on the day of the visit. Professional time spent includes the following activities: Preparing to see the patient (review of tests), Obtaining and/or reviewing separately obtained history (admission/discharge record), Performing a medically appropriate examination and/or evaluation , Ordering medications/tests/procedures, referring and communicating with other health care professionals, Documenting clinical information in the EMR, Independently interpreting results (not separately reported), Communicating results to the patient/family/caregiver, Counseling and educating the patient/family/caregiver and Care coordination (not separately reported).  Electronically signed by:   Plan d/w requesting provider as well as ID pharm D  Of note, portions of this note may have been created with voice recognition software. While this note has been edited for accuracy,  occasional wrong-word or 'sound-a-like' substitutions may have occurred due to the inherent limitations of voice recognition software.   Terre Ferri, MD Infectious Disease Physician [facility] Pager: [phone]

## 2023-11-30 NOTE — TOC Progression Note (Signed)
 Transition of Care [facility]) - Progression Note    Patient Details  Name: Garrett Ferguson MRN: [id number] Date of Birth: 11-Apr-1946  Transition of [facility]) CM/SW Contact  Nena LITTIE Coffee, RN Phone Number: 11/30/2023, 5:49 PM  Clinical Narrative:    PICC/midline has been placed. Plan is to dc Saturday, 4/19. Pt is active c/[facility]. [facility] scheduled to see pt Sunday, 4/20, for IV management and teaching. [facility] is to deliver outpatient IV antibiotic and has contacted pt's wife. [facility] hosp[facility] to admit pt when IV antibiotic therapy is complete. Agency info added to AVS. Pt and wife updated.   Expected Discharge Plan: Home w Hospice Care Barriers to Discharge: Continued Medical Work up  Expected Discharge Plan and Services   [facility] Agency: [facility] Date [facility] Agency Contacted: 11/29/23 Time HH Agency Contacted: 1036 Representative spoke with at [facility] Agency: Lucie  Social Determinants of Health (SDOH) Interventions SDOH Screenings   Food Insecurity: No Food Insecurity (11/28/2023)  Housing: Low Risk  (11/28/2023)  Transportation Needs: No Transportation Needs (11/28/2023)  Utilities: Not At Risk (11/28/2023)  Financial Resource Strain: Medium Risk (04/06/2021)   Received from [facility], [facility]  Physical Activity: Insufficiently Active (02/23/2021)   Received from [facility], [facility]  Social Connections: Moderately Integrated (11/28/2023)  Stress: No Stress Concern Present (02/23/2021)   Received from [facility], [facility]  Tobacco Use: Low Risk  (11/28/2023)  Health Literacy: Low Risk  (02/23/2021)   Received from [facility], [facility]    Readmission Risk Interventions    10/22/2023    1:09 PM  Readmission Risk Prevention Plan  Transportation Screening Complete  PCP or Specialist Appt within 5-7 Days Complete  Home Care Screening Complete  Medication Review (RN CM) Complete

## 2023-11-30 NOTE — Discharge Instructions (Signed)
 The following agencies will contact you individually:   [facility] (home health nurse) [facility] [address], [place], [place], [address] [phone]  [facility] [address], [place], [place] [address] [phone] (will deliver medication to be administered via IV)  [facility] (hospice) [address] [place], [place] [address] [phone] [facility] services will begin when IV antibiotics are complete)

## 2023-11-30 NOTE — Progress Notes (Signed)
 OT Cancellation Note  Patient Details Name: Garrett Ferguson MRN: [id number] DOB: 1946-06-28   Cancelled Treatment:    Reason Eval/Treat Not Completed: Other (comment). Per the social work note, the pt is planning to d/c with hospice care. Pt is not appropriate for further OT services. Pt will be discharged from the OT caseload.  Thank you  Thurnell Floss OT, MOT  Thurnell Floss 11/30/2023, 11:30 AM

## 2023-11-30 NOTE — Progress Notes (Signed)
 PHARMACY CONSULT NOTE FOR:  OUTPATIENT  PARENTERAL ANTIBIOTIC THERAPY (OPAT)  Indication: ESBL Kleb Pneumo UTI  Regimen: Ertapenem  1 gm every 24 hours  End date: 12/06/23  IV antibiotic discharge orders are pended. To discharging provider:  please sign these orders via discharge navigator,  Select New Orders & click on the button choice - Manage This Unsigned Work.     Thank you for allowing pharmacy to be a part of this patient's care.  Denson Flake, PharmD, BCPS, BCIDP Infectious Diseases Clinical Pharmacist Phone: [phone] 11/30/2023, 12:51 PM

## 2023-12-01 DIAGNOSIS — D461 Refractory anemia with ring sideroblasts: Secondary | ICD-10-CM | POA: Diagnosis not present

## 2023-12-01 LAB — BASIC METABOLIC PANEL WITH GFR
Anion gap: 11 (ref 5–15)
BUN: 15 mg/dL (ref 8–23)
CO2: 25 mmol/L (ref 22–32)
Calcium: 9 mg/dL (ref 8.9–10.3)
Chloride: 102 mmol/L (ref 98–111)
Creatinine, Ser: 0.41 mg/dL — ABNORMAL LOW (ref 0.61–1.24)
GFR, Estimated: >60 mL/min (ref >60)
Glucose, Bld: 90 mg/dL (ref 70–99)
Potassium: 3.7 mmol/L (ref 3.5–5.1)
Sodium: 138 mmol/L (ref 135–145)

## 2023-12-01 LAB — CBC
HCT: 38.5 % — ABNORMAL LOW (ref 39.0–52.0)
Hemoglobin: 12.2 g/dL — ABNORMAL LOW (ref 13.0–17.0)
MCH: 29.4 pg (ref 26.0–34.0)
MCHC: 31.7 g/dL (ref 30.0–36.0)
MCV: 92.8 fL (ref 80.0–100.0)
Platelets: 218 10*3/uL (ref 150–400)
RBC: 4.15 MIL/uL — ABNORMAL LOW (ref 4.22–5.81)
RDW: 13.4 % (ref 11.5–15.5)
WBC: 6.1 10*3/uL (ref 4.0–10.5)
nRBC: 0 % (ref 0.0–0.2)

## 2023-12-01 LAB — GLUCOSE, CAPILLARY: Glucose-Capillary: 80 mg/dL (ref 70–99)

## 2023-12-01 MED ORDER — FLORANEX PO PACK: 1.0000 g | PACK | Freq: Three times a day (TID) | ORAL | 0 refills | Status: AC

## 2023-12-01 MED ORDER — METOPROLOL SUCCINATE ER 25 MG PO TB24
50.0000 mg | ORAL_TABLET | Freq: Every day | ORAL | Status: DC
Start: 2023-12-01 — End: 2023-12-01
  Administered 2023-12-01: 50 mg via ORAL
  Filled 2023-12-01: qty 2

## 2023-12-01 NOTE — Discharge Summary (Signed)
 Physician Discharge Summary   Patient: Garrett Ferguson MRN: [id number] DOB: 03/13/1946  Admit date:     11/28/2023  Discharge date: 12/01/23  Discharge Physician: Bobbetta Burnet   PCP: [facility]   Recommendations at discharge:   Follow-up with palliative care as an outpatient with likely referral to hospice Continue on complete 7 days of IV antibiotics per ID recommendations (ertapenem  1g IV daily for 7 days. EOT 12/06/23) Continue and maintain midline IV site till antibiotics is completed then you should be discontinued Follow-up with PCP in 1 to 2 weeks CBC CMP in one week -results to PCP  Discharge Diagnoses: Principal Problem:   IRSA (idiopathic refractory sideroblastic anemia) (HCC) Active Problems:   Hypotensive episode   Palliative care encounter   UTI (urinary tract infection)   Hypoalbuminemia   Restless leg syndrome   Hypertension   History of bladder cancer   Lymphedema   Hyperlipidemia   Physical deconditioning   Depression   SIRS (systemic inflammatory response syndrome) (HCC)  Resolved Problems:   * No resolved hospital problems. *  Hospital Course: Garrett Ferguson is a 78 year old male with extensive history of recent hospitalization for sepsis, SNF placement on discharge, HTN, HLD, depression, GERD, H/O bladder cancer, restless leg syndrome, chronic lymphedema, CKD... Presenting once again to the ED with chief complaint of dysuria, and progressive generalized weaknesses.  Patient had a recent hospitalization for sepsis, due to severe debility was discharged to [facility] where he was recently discharged from.  Since discharge she is reporting a progressively getting weak unable to carry his ADLs unable to ambulate.  Decreased appetite.  Reporting increased frequency of urination, burning.  Recent hospitalization for urosepsis- from 3/6- 3/22 with intubation admission to ICU  Family confirms DNR/DNI status, also previous discussion of  palliative care and hospice  ED evaluation/course: Blood pressure 106/63, pulse 69, temperature 97.6 F (36.4 C), resp. rate (!) 22, SpO2 95%. Labs: WBC of 9.0, potassium 2.9, creatinine 0.39, glucose 112, magnesium  1.6, otherwise within normal limits UA reveals: Cloudy, moderate hemoglobin, leukocyte Estrace, nitrites, many bacteria, WBC>50  Potassium and magnesium  were replaced in ED, 500 mL of normal saline given, with IV Rocephin    SIRS due to UTI (urinary tract infection) -Improved, hemodynamically stable now  - Urine culture positive for ESBL Klebsiella, multidrug resistant -ID consulted, continue meropenem per sensitivity for 7 more  Days End date 12/06/2023. -midline was placed-to continue COVID.  For antibiotics    - QMV:HQIONGE SIRS criteria, with hypotension, tachycardic-and UTI - Sepsis was ruled out, lactic acid 1.1, - Complicated recurrent UTI in the setting of history of bladder cancer -POA: UA positive for leukocyte esterase, nitrates, many bacteria, urine WBC > 50 - Blood cultures no growth to date   - Continue IV antibiotic Rocephin  4/16 >> switch to meropenem - S/p IV fluid   - Midline was placed on 12/01/2023, will infusion of IV antibiotics at home.   Palliative care encounter - DNR/DNI confirmed -S/p discussion with palliative care, goals of care - Palliative care team to follow-up - Upon discharge to Home via EMS with the benefits of [facility] hospice care    Hypotensive episode-history of hypertension -Blood pressure improving, elevated -Restarting home medication of Norvasc , Lasix , losartan  judiciously     Hypoalbuminemia - Replace with 50 g X 1      Hyperlipidemia-discontinued statins, benefit outweighs the risk-patient will be on the palliative care, likely hospice   Lymphedema History of chronic lymphedema Bilateral Lower  Extremity Edema - Continue supportive care, wound care was consulted - recent echo with EF 60-65%, no RWMA, moderately  reduced RVSF, severely dilated LA, IVC dilated with <50% resp variability - BNP mildly elevated -chronically   History of bladder cancer - In remission-not under any treatment - Continue tamsulosin    Hypertension -home BP medication of Norvasc , Lasix , losartan - To be resumed  Restless leg syndrome-with anxiety and depression - Resume Neurontin  and Mirapex    Physical deconditioning - Severe debility, now unable to ambulate, needing assist with all ADLs - Agree to home health   significant L>R LE weakness - denies numbness or saddle anesthesia - seems to be longstanding - has history of multiple surgeries to L knee and chronic pain related to that.  Weakness could be related to chronic deconditioning related to pain and lymphedema.   - Mostly wheelchair dependent at home since August 1, 202      Consultants: ID/palliative care team Procedures performed: Midline central line placement 11/30/2023 Disposition:  Home with palliative care- Diet recommendation:  Discharge Diet Orders (From admission, onward)     Start     Ordered   12/01/23 0000  Diet - low sodium heart healthy        12/01/23 1042           Regular diet DISCHARGE MEDICATION: Allergies as of 12/01/2023       Reactions   Sulfamethoxazole-trimethoprim    Diclofenac Sodium Rash   Latex Rash   Levofloxacin Rash   Pt developed red spots on leg and itching 2-3 hours after taking po Levaquin   Penicillins Rash        Medication List     TAKE these medications    acetaminophen  325 MG tablet Commonly known as: TYLENOL  Take 2 tablets (650 mg total) by mouth every 6 (six) hours as needed for mild pain (pain score 1-3) or fever.   amLODipine  5 MG tablet Commonly known as: NORVASC  Take 1 tablet (5 mg total) by mouth daily.   aspirin  EC 81 MG tablet Take 81 mg by mouth daily.   atorvastatin  10 MG tablet Commonly known as: LIPITOR Take 10 mg by mouth daily.   buPROPion  150 MG 24 hr tablet Commonly  known as: WELLBUTRIN  XL Take 150 mg by mouth daily.   feeding supplement Liqd Take 237 mLs by mouth 2 (two) times daily between meals.   gabapentin  100 MG capsule Commonly known as: NEURONTIN  Take 1 capsule (100 mg total) by mouth 2 (two) times daily.   lactobacillus Pack Take 1 packet (1 g total) by mouth 3 (three) times daily with meals for 10 days.   losartan  25 MG tablet Commonly known as: COZAAR  Take 1 tablet (25 mg total) by mouth daily.   naphazoline-glycerin  0.012-0.25 % Soln Commonly known as: CLEAR EYES REDNESS Place 2 drops into the left eye 4 (four) times daily as needed for eye irritation.   oxyCODONE  5 MG immediate release tablet Commonly known as: Oxy IR/ROXICODONE  Take 1-2 tablets (5-10 mg total) by mouth every 6 (six) hours as needed for moderate pain (pain score 4-6) or severe pain (pain score 7-10).   pramipexole  1 MG tablet Commonly known as: MIRAPEX  TAKE 1 TABLET BY MOUTH TWICE DURING THE DAY AND ONE AND ONE-HALF TABLETS AT NIGHT   sertraline  100 MG tablet Commonly known as: ZOLOFT  Take 200 mg by mouth daily.   tamsulosin  0.4 MG Caps capsule Commonly known as: FLOMAX  Take 0.4 mg by mouth daily.  triamcinolone cream 0.1 % Commonly known as: KENALOG Apply topically 3 (three) times a week.   Vitamin D3 1.25 MG (50000 UT) Caps Take 1 capsule by mouth once a week.               Discharge Care Instructions  (From admission, onward)           Start     Ordered   12/01/23 0000  Discharge wound care:       Comments: Per wound care RN instructions   12/01/23 1042            Follow-up Information     [facility]. Call.   Why: RN will call to set up a time for first Home Visit               Discharge Exam: Filed Weights   11/28/23 2353 12/01/23 0427  Weight: 91.2 kg 83.1 kg        General:  AAO x 3,  cooperative, no distress;   HEENT:  Normocephalic, PERRL, otherwise with in Normal limits   Neuro:  CNII-XII  intact. , normal motor and sensation, reflexes intact   Lungs:   Clear to auscultation BL, Respirations unlabored,  No wheezes / crackles  Cardio:    S1/S2, RRR, No murmure, No Rubs or Gallops   Abdomen:  Soft, non-tender, bowel sounds active all four quadrants, no guarding or peritoneal signs.  Muscular  skeletal:  Limited exam -bedbound unable to ambulate independently, needs assist with all ADLs global generalized weaknesses - in bed, able to move upper extremities in bed, minimal movement with chronic venous stasis at lower extremities 2+ pulses,  symmetric, No pitting edema  Skin:  Dry, warm to touch, negative for any Rashes,  Wounds: Please see nursing documentation          Condition at discharge: fair  The results of significant diagnostics from this hospitalization (including imaging, microbiology, ancillary and laboratory) are listed below for reference.   Imaging Studies: US  EKG SITE RITE Result Date: 11/30/2023 If Site Rite image not attached, placement could not be confirmed due to current cardiac rhythm.   Microbiology: Results for orders placed or performed during the hospital encounter of 11/28/23  Urine Culture (for pregnant, neutropenic or urologic patients or patients with an indwelling urinary catheter)     Status: Abnormal   Collection Time: 11/28/23 12:30 PM   Specimen: Urine, Clean Catch  Result Value Ref Range Status   Specimen Description   Final    URINE, CLEAN CATCH Performed at [facility], [address], [place], [place] [address]    Special Requests   Final    NONE Performed at [facility], [address] [place], [place] [address]    Culture (A)  Final    >=100,000 COLONIES/mL KLEBSIELLA PNEUMONIAE Confirmed Extended Spectrum Beta-Lactamase Producer (ESBL).  In bloodstream infections from ESBL organisms, carbapenems are preferred over piperacillin/tazobactam. They are shown to have a lower risk of mortality.    Report Status 11/30/2023  FINAL  Final   Organism ID, Bacteria KLEBSIELLA PNEUMONIAE (A)  Final      Susceptibility   Klebsiella pneumoniae - MIC*    AMPICILLIN >=32 RESISTANT Resistant     CEFAZOLIN >=64 RESISTANT Resistant     CEFEPIME  8 INTERMEDIATE Intermediate     CEFTRIAXONE  >=64 RESISTANT Resistant     CIPROFLOXACIN  >=4 RESISTANT Resistant     GENTAMICIN >=16 RESISTANT Resistant     IMIPENEM 0.5 SENSITIVE Sensitive  NITROFURANTOIN  >=512 RESISTANT Resistant     TRIMETH/SULFA >=320 RESISTANT Resistant     AMPICILLIN/SULBACTAM >=32 RESISTANT Resistant     PIP/TAZO 16 SENSITIVE Sensitive ug/mL    * >=100,000 COLONIES/mL KLEBSIELLA PNEUMONIAE  Culture, blood (Routine X 2) w Reflex to ID Panel     Status: None (Preliminary result)   Collection Time: 11/28/23  2:55 PM   Specimen: BLOOD  Result Value Ref Range Status   Specimen Description BLOOD BLOOD LEFT ARM  Final   Special Requests   Final    BOTTLES DRAWN AEROBIC AND ANAEROBIC Blood Culture adequate volume   Culture   Final    NO GROWTH 2 DAYS Performed at [facility], [address], [place], [place] [address]    Report Status PENDING  Incomplete  Culture, blood (Routine X 2) w Reflex to ID Panel     Status: None (Preliminary result)   Collection Time: 11/28/23  2:55 PM   Specimen: BLOOD  Result Value Ref Range Status   Specimen Description BLOOD BLOOD LEFT HAND  Final   Special Requests   Final    Blood Culture results may not be optimal due to an inadequate volume of blood received in culture bottles BOTTLES DRAWN AEROBIC AND ANAEROBIC   Culture   Final    NO GROWTH 2 DAYS Performed at [facility], [address] [place], [place] [address]    Report Status PENDING  Incomplete    Labs: CBC: Recent Labs  Lab 11/28/23 1302 11/29/23 0209 11/30/23 0417 12/01/23 0538  WBC 9.0 8.0 6.9 6.1  HGB 13.3 11.9* 11.7* 12.2*  HCT 41.8 37.0* 37.7* 38.5*  MCV 92.9 93.2 93.5 92.8  PLT 201 177 188 218   Basic Metabolic Panel: Recent Labs  Lab  11/28/23 1302 11/28/23 1406 11/29/23 0209 11/30/23 0417 12/01/23 0538  NA 138  --  140 135 138  K 2.9*  --  3.3* 3.3* 3.7  CL 99  --  102 103 102  CO2 25  --  26 25 25   GLUCOSE 112*  --  98 92 90  BUN 18  --  18 18 15   CREATININE 0.39*  --  0.40* 0.44* 0.41*  CALCIUM  9.0  --  9.0 8.6* 9.0  MG 1.6*  --  1.8  --   --   PHOS  --  3.0  --   --   --    Liver Function Tests: Recent Labs  Lab 11/28/23 1302  AST 18  ALT 19  ALKPHOS 113  BILITOT 0.8  PROT 7.0  ALBUMIN  3.1*   CBG: Recent Labs  Lab 11/29/23 0939 11/30/23 0727 12/01/23 0723  GLUCAP 105* 95 80    Discharge time spent: greater than 30 minutes.  Signed: Bobbetta Burnet, MD [facility] 12/01/2023

## 2023-12-01 NOTE — Progress Notes (Signed)
 PROGRESS NOTE    Patient: Garrett Ferguson                            PCP: [facility]                    DOB: 04-05-1946            DOA: 11/28/2023 ZOX:[id number]             DOS: 12/01/2023, 10:55 AM   LOS: 3 days   Date of Service: The patient was seen and examined on 12/01/2023  Subjective:   The patient was seen and examined this morning, stable no acute distress Laying in bed comfortably, elevated BP 177/82. Denies any headaches or chest pain or shortness of breath  Brief Narrative:   Garrett Ferguson is a 78 year old male with extensive history of recent hospitalization for sepsis, SNF placement on discharge, HTN, HLD, depression, GERD, H/O bladder cancer, restless leg syndrome, chronic lymphedema, CKD... Presenting once again to the ED with chief complaint of dysuria, and progressive generalized weaknesses.  Patient had a recent hospitalization for sepsis, due to severe debility was discharged to [facility] where he was recently discharged from.  Since discharge she is reporting a progressively getting weak unable to carry his ADLs unable to ambulate.  Decreased appetite.  Reporting increased frequency of urination, burning.  Recent hospitalization for urosepsis- from 3/6- 3/22 with intubation admission to ICU  Family confirms DNR/DNI status, also previous discussion of palliative care and hospice  ED evaluation/course: Blood pressure 106/63, pulse 69, temperature 97.6 F (36.4 C), resp. rate (!) 22, SpO2 95%. Labs: WBC of 9.0, potassium 2.9, creatinine 0.39, glucose 112, magnesium  1.6, otherwise within normal limits UA reveals: Cloudy, moderate hemoglobin, leukocyte Estrace, nitrites, many bacteria, WBC>50  Potassium and magnesium  were replaced in ED, 500 mL of normal saline given, with IV Rocephin       Assessment & Plan:   Principal Problem:   IRSA (idiopathic refractory sideroblastic anemia) (HCC) Active Problems:   Hypotensive episode   Palliative  care encounter   UTI (urinary tract infection)   Hypoalbuminemia   Restless leg syndrome   Hypertension   History of bladder cancer   Lymphedema   Hyperlipidemia   Physical deconditioning   Depression   SIRS (systemic inflammatory response syndrome) (HCC)     SIRS due to UTI (urinary tract infection) -Improved, hemodynamically stable now  - Urine culture positive for ESBL Klebsiella, multidrug resistant -ID consulted, continue meropenem per sensitivity for 7 more  Days End date 12/06/2023. -midline was placed-to continue COVID.  For antibiotics     - WJX:BJYNWGN SIRS criteria, with hypotension, tachycardic-and UTI - Sepsis was ruled out, lactic acid 1.1, - Complicated recurrent UTI in the setting of history of bladder cancer -POA: UA positive for leukocyte esterase, nitrates, many bacteria, urine WBC > 50 - Blood cultures no growth to date   - Continue IV antibiotic Rocephin  4/16 >> switch to meropenem - S/p IV fluid    - Midline was placed on 12/01/2023, will infusion of IV antibiotics at home.    Palliative care encounter - DNR/DNI confirmed -S/p discussion with palliative care, goals of care - Palliative care team to follow-up - Upon discharge to Home via EMS with the benefits of [facility] hospice care    Hypotensive episode-history of hypertension -Blood pressure improving, elevated -Restarting home medication of Norvasc , Lasix , losartan  judiciously  Hypoalbuminemia - Replace with 50 g X 1      Hyperlipidemia-discontinued statins, benefit outweighs the risk-patient will be on the palliative care, likely hospice   Lymphedema History of chronic lymphedema Bilateral Lower Extremity Edema - Continue supportive care, wound care was consulted - recent echo with EF 60-65%, no RWMA, moderately reduced RVSF, severely dilated LA, IVC dilated with <50% resp variability - BNP mildly elevated -chronically   History of bladder cancer - In remission-not under any  treatment - Continue tamsulosin    Hypertension -home BP medication of Norvasc , Lasix , losartan - To be resumed   Restless leg syndrome-with anxiety and depression - Resume Neurontin  and Mirapex    Physical deconditioning - Severe debility, now unable to ambulate, needing assist with all ADLs - Agree to home health   significant L>R LE weakness - denies numbness or saddle anesthesia - seems to be longstanding - has history of multiple surgeries to L knee and chronic pain related to that.  Weakness could be related to chronic deconditioning related to pain and lymphedema.   - Mostly wheelchair dependent at home since August 1, 202         Consultants: ID/palliative care team Procedures performed: Midline central line placement 11/30/2023 ----------------------------------------------------------------------------------------------------------------------------------------------- Nutritional status:  The patient's BMI is: Body mass index is 25.55 kg/m. I agree with the assessment and plan as outlined b    ---------------------------------------------------------------------------------------------------------------------------------------------------- Cultures; Blood Cultures x 2 >> no growth to date Urine Culture  >>> ESBL Klebsiella-multidrug-resistant  IV antibiotics Rocephin  4-16- 25>>> discontinued 11/29/2023 initiated meropenem due to multidrug-resistant   ------------------------------------------------------------------------------------------------------------------------------------------------  DVT prophylaxis:  heparin  injection 5,000 Units Start: 11/28/23 2200 TED hose Start: 11/28/23 1406 SCDs Start: 11/28/23 1406   Code Status:   Code Status: Limited: Do not attempt resuscitation (DNR) -DNR-LIMITED -Do Not Intubate/DNI   Family Communication: No family member present at bedside- attempt will be made to update daily The above findings and plan of care has been  discussed with patient (and family)  in detail,  they expressed understanding and agreement of above. -Advance care planning has been discussed.   Admission status:   Status is: Inpatient Remains inpatient appropriate because: Needing IV antibiotics, currently total care, needing assist with all ADLs   Disposition: From  - home             Planning for discharge in likely home with hospice referral in 1-2 days  Procedures:   No admission procedures for hospital encounter.   Antimicrobials:  Anti-infectives (From admission, onward)    Start     Dose/Rate Route Frequency Ordered Stop   11/30/23 1345  ertapenem  (INVANZ ) 1 g in sodium chloride  0.9 % 100 mL IVPB        1 g 200 mL/hr over 30 Minutes Intravenous Every 24 hours 11/30/23 1249     11/28/23 1430  cefTRIAXone  (ROCEPHIN ) 2 g in sodium chloride  0.9 % 100 mL IVPB  Status:  Discontinued        2 g 200 mL/hr over 30 Minutes Intravenous Every 24 hours 11/28/23 1407 11/30/23 1147   11/28/23 1400  cefTRIAXone  (ROCEPHIN ) 1 g in sodium chloride  0.9 % 100 mL IVPB  Status:  Discontinued        1 g 200 mL/hr over 30 Minutes Intravenous  Once 11/28/23 1345 11/28/23 1407        Medication:   amLODipine   10 mg Oral Daily   aspirin  EC  81 mg Oral Daily   buPROPion   150 mg Oral Daily  feeding supplement  237 mL Oral BID BM   gabapentin   100 mg Oral BID   heparin   5,000 Units Subcutaneous Q8H   lactobacillus  1 g Oral TID WC   metoprolol  succinate  50 mg Oral Daily   pramipexole   0.25 mg Oral TID   sertraline   200 mg Oral Daily   sodium chloride  flush  10-40 mL Intracatheter Q12H   sodium chloride  flush  3 mL Intravenous Q12H   tamsulosin   0.4 mg Oral Daily   [START ON 12/05/2023] Vitamin D  (Ergocalciferol )  50,000 Units Oral Q7 days    acetaminophen  **OR** acetaminophen , bisacodyl , hydrALAZINE , HYDROmorphone  (DILAUDID ) injection, ipratropium, levalbuterol , ondansetron  **OR** ondansetron  (ZOFRAN ) IV, mouth rinse, oxyCODONE ,  senna-docusate, sodium chloride  flush, traZODone    Objective:   Vitals:   11/30/23 0500 11/30/23 2056 12/01/23 0427 12/01/23 1001  BP: (!) 164/85 (!) 147/72 (!) 163/83 135/67  Pulse:  62 72   Resp: 16 20 18    Temp: 98 F (36.7 C) 98.1 F (36.7 C) 97.6 F (36.4 C)   TempSrc:  Oral Oral   SpO2: 96% 97% 98%   Weight:   83.1 kg   Height:        Intake/Output Summary (Last 24 hours) at 12/01/2023 1055 Last data filed at 12/01/2023 1038 Gross per 24 hour  Intake 880 ml  Output 700 ml  Net 180 ml   Filed Weights   11/28/23 2353 12/01/23 0427  Weight: 91.2 kg 83.1 kg     Physical examination:         General:  AAO x 3,  cooperative, no distress;   HEENT:  Normocephalic, PERRL, otherwise with in Normal limits   Neuro:  CNII-XII intact. , normal motor and sensation, reflexes intact   Lungs:   Clear to auscultation BL, Respirations unlabored,  No wheezes / crackles  Cardio:    S1/S2, RRR, No murmure, No Rubs or Gallops   Abdomen:  Soft, non-tender, bowel sounds active all four quadrants, no guarding or peritoneal signs.  Muscular  skeletal:  Limited exam -global generalized weaknesses - in bed, able to move all upper extremities,   2+ pulses,  symmetric, severe lower extremity weakness, with chronic venous stasis  Skin:  Dry, warm to touch, negative for any Rashes,  Wounds: Please see nursing documentation             ------------------------------------------------------------------------------------------------------------------------------------------    LABs:     Latest Ref Rng & Units 12/01/2023    5:38 AM 11/30/2023    4:17 AM 11/29/2023    2:09 AM  CBC  WBC 4.0 - 10.5 K/uL 6.1  6.9  8.0   Hemoglobin 13.0 - 17.0 g/dL 16.1  09.6  04.5   Hematocrit 39.0 - 52.0 % 38.5  37.7  37.0   Platelets 150 - 400 K/uL 218  188  177       Latest Ref Rng & Units 12/01/2023    5:38 AM 11/30/2023    4:17 AM 11/29/2023    2:09 AM  CMP  Glucose 70 - 99 mg/dL 90  92   98   BUN 8 - 23 mg/dL 15  18  18    Creatinine 0.61 - 1.24 mg/dL 4.09  8.11  9.14   Sodium 135 - 145 mmol/L 138  135  140   Potassium 3.5 - 5.1 mmol/L 3.7  3.3  3.3   Chloride 98 - 111 mmol/L 102  103  102   CO2 22 - 32 mmol/L 25  25  26   Calcium  8.9 - 10.3 mg/dL 9.0  8.6  9.0        Micro Results Recent Results (from the past 240 hours)  Urine Culture (for pregnant, neutropenic or urologic patients or patients with an indwelling urinary catheter)     Status: Abnormal   Collection Time: 11/28/23 12:30 PM   Specimen: Urine, Clean Catch  Result Value Ref Range Status   Specimen Description   Final    URINE, CLEAN CATCH Performed at [facility], [address], [place], [place] [address]    Special Requests   Final    NONE Performed at [facility], [address], [place], [place] [address]    Culture (A)  Final    >=100,000 COLONIES/mL KLEBSIELLA PNEUMONIAE Confirmed Extended Spectrum Beta-Lactamase Producer (ESBL).  In bloodstream infections from ESBL organisms, carbapenems are preferred over piperacillin/tazobactam. They are shown to have a lower risk of mortality.    Report Status 11/30/2023 FINAL  Final   Organism ID, Bacteria KLEBSIELLA PNEUMONIAE (A)  Final      Susceptibility   Klebsiella pneumoniae - MIC*    AMPICILLIN >=32 RESISTANT Resistant     CEFAZOLIN >=64 RESISTANT Resistant     CEFEPIME  8 INTERMEDIATE Intermediate     CEFTRIAXONE  >=64 RESISTANT Resistant     CIPROFLOXACIN  >=4 RESISTANT Resistant     GENTAMICIN >=16 RESISTANT Resistant     IMIPENEM 0.5 SENSITIVE Sensitive     NITROFURANTOIN  >=512 RESISTANT Resistant     TRIMETH/SULFA >=320 RESISTANT Resistant     AMPICILLIN/SULBACTAM >=32 RESISTANT Resistant     PIP/TAZO 16 SENSITIVE Sensitive ug/mL    * >=100,000 COLONIES/mL KLEBSIELLA PNEUMONIAE  Culture, blood (Routine X 2) w Reflex to ID Panel     Status: None (Preliminary result)   Collection Time: 11/28/23  2:55 PM   Specimen: BLOOD  Result Value  Ref Range Status   Specimen Description BLOOD BLOOD LEFT ARM  Final   Special Requests   Final    BOTTLES DRAWN AEROBIC AND ANAEROBIC Blood Culture adequate volume   Culture   Final    NO GROWTH 2 DAYS Performed at [facility], [address] [place], [place] [address]    Report Status PENDING  Incomplete  Culture, blood (Routine X 2) w Reflex to ID Panel     Status: None (Preliminary result)   Collection Time: 11/28/23  2:55 PM   Specimen: BLOOD  Result Value Ref Range Status   Specimen Description BLOOD BLOOD LEFT HAND  Final   Special Requests   Final    Blood Culture results may not be optimal due to an inadequate volume of blood received in culture bottles BOTTLES DRAWN AEROBIC AND ANAEROBIC   Culture   Final    NO GROWTH 2 DAYS Performed at [facility], [address], [place], [place] [address]    Report Status PENDING  Incomplete    Radiology Reports US  EKG SITE RITE Result Date: 11/30/2023 If Site Rite image not attached, placement could not be confirmed due to current cardiac rhythm.   SIGNED: Bobbetta Burnet, MD, FHM. FAAFP. Arlin Benes - [facility] Time spent - 55 min.  In seeing, evaluating and examining the patient. Reviewing medical records, labs, drawn plan of care. [facility],  Pager (please use [email or web address] to page/ text) Please use Epic Secure Chat for non-urgent communication (7AM-7PM)  If 7PM-7AM, please contact night-coverage [email or web address], 12/01/2023, 10:55 AM

## 2023-12-03 ENCOUNTER — Inpatient Hospital Stay: Payer: Self-pay | Admitting: Infectious Diseases

## 2023-12-03 ENCOUNTER — Telehealth: Payer: Self-pay

## 2023-12-03 LAB — CULTURE, BLOOD (ROUTINE X 2)
Culture: NO GROWTH
Culture: NO GROWTH
Special Requests: ADEQUATE

## 2023-12-03 NOTE — Telephone Encounter (Signed)
 Per Dr. Gillian Lacrosse ok to pull picc after last dose of IV abx on 4/24. Sent a message to [employer] about pull picc orders.  Per Dr. Gillian Lacrosse no further follow up is needed.

## 2023-12-04 ENCOUNTER — Telehealth: Payer: Self-pay

## 2023-12-04 ENCOUNTER — Ambulatory Visit: Admitting: Internal Medicine

## 2023-12-04 NOTE — Telephone Encounter (Signed)
 I called the pt to see if he would like to come in sooner today for his appt.... but he advises that he is home bound and would need EMS transport..he is on Palliative Care and will; call if he needs to be seen. If his situation improves he will let us  know and I will get him worked in any time he needs to be seen.

## 2024-06-14 DEATH — deceased
